# Patient Record
Sex: Male | Born: 1955
Health system: Southern US, Community
[De-identification: ages and names within clinical notes are randomized; demographics above are authoritative.]

## PROBLEM LIST (undated history)

## (undated) DIAGNOSIS — Z8719 Personal history of other diseases of the digestive system: Secondary | ICD-10-CM

## (undated) DIAGNOSIS — K5790 Diverticulosis of intestine, part unspecified, without perforation or abscess without bleeding: Secondary | ICD-10-CM

## (undated) DIAGNOSIS — C61 Malignant neoplasm of prostate: Secondary | ICD-10-CM

## (undated) DIAGNOSIS — Z87442 Personal history of urinary calculi: Secondary | ICD-10-CM

## (undated) DIAGNOSIS — K2270 Barrett's esophagus without dysplasia: Secondary | ICD-10-CM

## (undated) DIAGNOSIS — T7840XA Allergy, unspecified, initial encounter: Secondary | ICD-10-CM

## (undated) DIAGNOSIS — K219 Gastro-esophageal reflux disease without esophagitis: Secondary | ICD-10-CM

## (undated) DIAGNOSIS — K5792 Diverticulitis of intestine, part unspecified, without perforation or abscess without bleeding: Secondary | ICD-10-CM

## (undated) DIAGNOSIS — T4145XA Adverse effect of unspecified anesthetic, initial encounter: Secondary | ICD-10-CM

## (undated) DIAGNOSIS — M1990 Unspecified osteoarthritis, unspecified site: Secondary | ICD-10-CM

## (undated) DIAGNOSIS — E785 Hyperlipidemia, unspecified: Secondary | ICD-10-CM

## (undated) HISTORY — DX: Unspecified osteoarthritis, unspecified site: M19.90

## (undated) HISTORY — DX: Hyperlipidemia, unspecified: E78.5

## (undated) HISTORY — DX: Allergy, unspecified, initial encounter: T78.40XA

## (undated) HISTORY — DX: Barrett's esophagus without dysplasia: K22.70

## (undated) HISTORY — DX: Diverticulosis of intestine, part unspecified, without perforation or abscess without bleeding: K57.90

## (undated) HISTORY — PX: UPPER GASTROINTESTINAL ENDOSCOPY: SHX188

## (undated) HISTORY — DX: Malignant neoplasm of prostate: C61

## (undated) HISTORY — DX: Gastro-esophageal reflux disease without esophagitis: K21.9

## (undated) HISTORY — PX: MOHS SURGERY: SHX181

## (undated) HISTORY — DX: Diverticulitis of intestine, part unspecified, without perforation or abscess without bleeding: K57.92

## (undated) HISTORY — DX: Personal history of other diseases of the digestive system: Z87.19

## (undated) HISTORY — PX: COLONOSCOPY: SHX174

---

## 2004-05-06 ENCOUNTER — Ambulatory Visit: Payer: Self-pay | Admitting: Family Medicine

## 2004-05-28 ENCOUNTER — Ambulatory Visit: Payer: Self-pay | Admitting: Family Medicine

## 2004-11-12 ENCOUNTER — Ambulatory Visit: Payer: Self-pay | Admitting: Internal Medicine

## 2004-11-17 ENCOUNTER — Ambulatory Visit: Payer: Self-pay | Admitting: Family Medicine

## 2004-11-18 ENCOUNTER — Encounter: Admission: RE | Admit: 2004-11-18 | Discharge: 2004-11-18 | Payer: Self-pay | Admitting: Family Medicine

## 2004-12-02 ENCOUNTER — Ambulatory Visit: Payer: Self-pay | Admitting: Family Medicine

## 2005-03-04 ENCOUNTER — Ambulatory Visit: Payer: Self-pay | Admitting: Family Medicine

## 2005-04-08 ENCOUNTER — Ambulatory Visit: Payer: Self-pay | Admitting: Family Medicine

## 2005-05-02 HISTORY — PX: SHOULDER ARTHROSCOPY: SHX128

## 2005-06-13 ENCOUNTER — Ambulatory Visit: Payer: Self-pay | Admitting: Family Medicine

## 2006-06-03 ENCOUNTER — Emergency Department (HOSPITAL_COMMUNITY): Admission: EM | Admit: 2006-06-03 | Discharge: 2006-06-03 | Payer: Self-pay | Admitting: Emergency Medicine

## 2006-06-07 ENCOUNTER — Ambulatory Visit: Payer: Self-pay | Admitting: Family Medicine

## 2006-06-07 LAB — CONVERTED CEMR LAB
ALT: 39 U/L
AST: 32 U/L
Albumin: 4.3 g/dL
Alkaline Phosphatase: 76 U/L
BUN: 16 mg/dL
Basophils Absolute: 0 K/uL
Basophils Relative: 0.7 %
Bilirubin, Direct: 0.1 mg/dL
CO2: 31 meq/L
Calcium: 9.4 mg/dL
Chloride: 108 meq/L
Cholesterol: 213 mg/dL
Creatinine, Ser: 1.1 mg/dL
Direct LDL: 148 mg/dL
Eosinophils Absolute: 0.1 K/uL
Eosinophils Relative: 2.3 %
GFR calc Af Amer: 91 mL/min
GFR calc non Af Amer: 75 mL/min
Glucose, Bld: 89 mg/dL
HCT: 42.7 %
HDL: 48.2 mg/dL
Hemoglobin: 14.8 g/dL
Lymphocytes Relative: 30.6 %
MCHC: 34.8 g/dL
MCV: 89.4 fL
Monocytes Absolute: 0.4 K/uL
Monocytes Relative: 8 %
Neutro Abs: 3 K/uL
Neutrophils Relative %: 58.4 %
PSA: 1.39 ng/mL
Platelets: 322 K/uL
Potassium: 3.9 meq/L
RBC: 4.77 M/uL
RDW: 11.8 %
Sodium: 144 meq/L
TSH: 1.31 u[IU]/mL
Total Bilirubin: 0.7 mg/dL
Total CHOL/HDL Ratio: 4.4
Total Protein: 6.6 g/dL
Triglycerides: 125 mg/dL
VLDL: 25 mg/dL
WBC: 5.1 10*3/microliter

## 2006-06-19 ENCOUNTER — Ambulatory Visit: Payer: Self-pay | Admitting: Family Medicine

## 2006-07-07 ENCOUNTER — Ambulatory Visit (HOSPITAL_COMMUNITY): Admission: RE | Admit: 2006-07-07 | Discharge: 2006-07-07 | Payer: Self-pay | Admitting: Family Medicine

## 2006-10-17 ENCOUNTER — Encounter: Payer: Self-pay | Admitting: Internal Medicine

## 2006-10-17 ENCOUNTER — Ambulatory Visit (HOSPITAL_COMMUNITY): Admission: RE | Admit: 2006-10-17 | Discharge: 2006-10-17 | Payer: Self-pay | Admitting: Internal Medicine

## 2006-10-23 ENCOUNTER — Ambulatory Visit: Payer: Self-pay | Admitting: Internal Medicine

## 2006-11-10 DIAGNOSIS — M5137 Other intervertebral disc degeneration, lumbosacral region: Secondary | ICD-10-CM | POA: Insufficient documentation

## 2006-11-10 DIAGNOSIS — M5030 Other cervical disc degeneration, unspecified cervical region: Secondary | ICD-10-CM | POA: Insufficient documentation

## 2006-11-10 DIAGNOSIS — J309 Allergic rhinitis, unspecified: Secondary | ICD-10-CM

## 2006-11-24 ENCOUNTER — Ambulatory Visit: Payer: Self-pay | Admitting: Internal Medicine

## 2006-11-28 ENCOUNTER — Ambulatory Visit (HOSPITAL_COMMUNITY): Admission: RE | Admit: 2006-11-28 | Discharge: 2006-11-28 | Payer: Self-pay | Admitting: Internal Medicine

## 2006-12-12 ENCOUNTER — Telehealth (INDEPENDENT_AMBULATORY_CARE_PROVIDER_SITE_OTHER): Payer: Self-pay | Admitting: *Deleted

## 2006-12-19 ENCOUNTER — Ambulatory Visit (HOSPITAL_COMMUNITY): Admission: RE | Admit: 2006-12-19 | Discharge: 2006-12-19 | Payer: Self-pay | Admitting: Family Medicine

## 2006-12-20 ENCOUNTER — Telehealth (INDEPENDENT_AMBULATORY_CARE_PROVIDER_SITE_OTHER): Payer: Self-pay | Admitting: *Deleted

## 2007-02-07 ENCOUNTER — Telehealth: Payer: Self-pay | Admitting: Family Medicine

## 2007-03-02 ENCOUNTER — Ambulatory Visit: Payer: Self-pay | Admitting: Family Medicine

## 2007-03-02 DIAGNOSIS — E782 Mixed hyperlipidemia: Secondary | ICD-10-CM | POA: Insufficient documentation

## 2007-03-02 LAB — CONVERTED CEMR LAB
LDL Cholesterol: 126 mg/dL — ABNORMAL HIGH (ref 0–99)
Total CHOL/HDL Ratio: 3.5
Triglycerides: 102 mg/dL (ref <150)

## 2007-06-07 IMAGING — PT NM PET TUM IMG SKULL BASE T - THIGH
6 series · 25 of 25 positions shown · non-contrast
Comparison: Abdominal CT of 06/03/06.

CLINICAL DATA: Right cardiophrenic angle soft tissue masses on prior abdominal CT.   
FDG PET-CT TUMOR IMAGING (SKULL BASE TO THIGHS):
Fasting Blood Glucose:  116.
TECHNIQUE: 15.3 mCi F-18 FDG was injected intravenously via the left AC injection.  Full-ring PET imaging was performed from the skull base through the mid-thighs 60 minutes after injection.  CT data was obtained and used for attenuation correction and anatomic localization only.  (This was not acquired as a diagnostic CT examination).

[Series 1: pet ac · axial · 3.3mm · 4.69mm/px · z∈[-870,+0]mm · 5 of 267 slices shown]
[im 1/267]
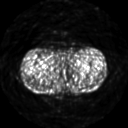
[im 67/267]
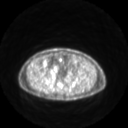
[im 134/267]
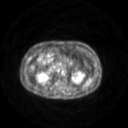
[im 200/267]
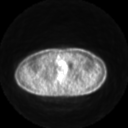
[im 267/267]
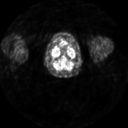

[Series 2: ct images · axial · 3.8mm · 0.98mm/px · z∈[-870,+0]mm · 5 of 264 slices shown]
[im 1/264]
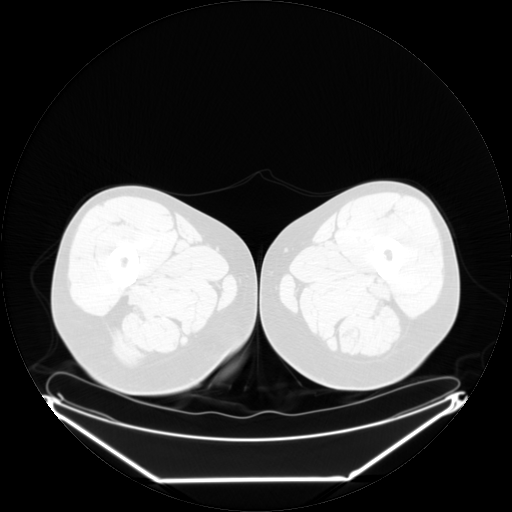
[im 66/264]
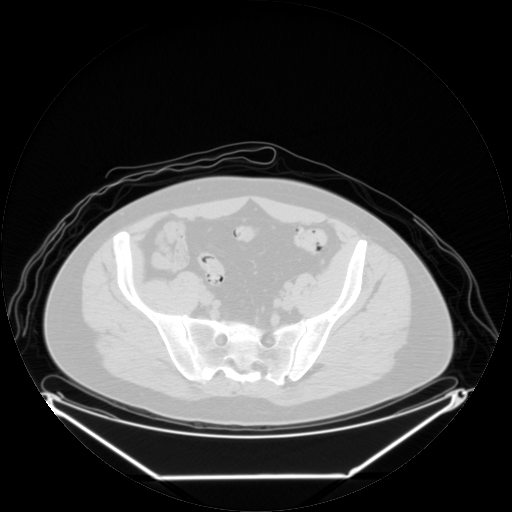
[im 132/264]
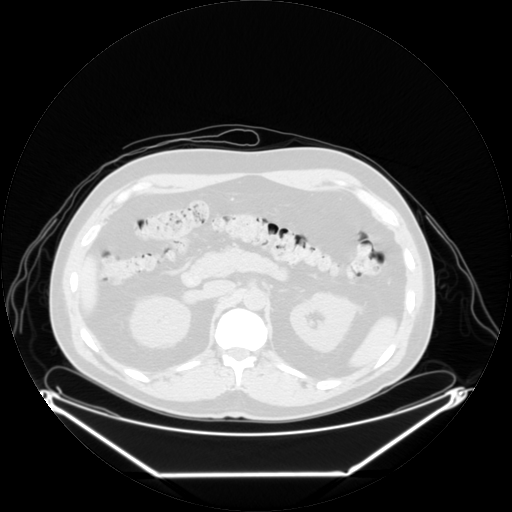
[im 198/264]
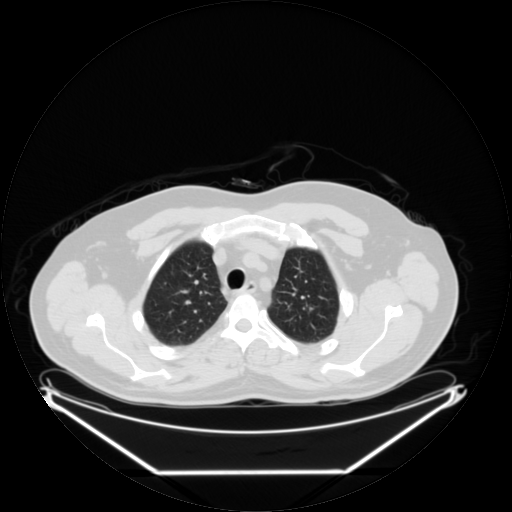
[im 264/264  brain]
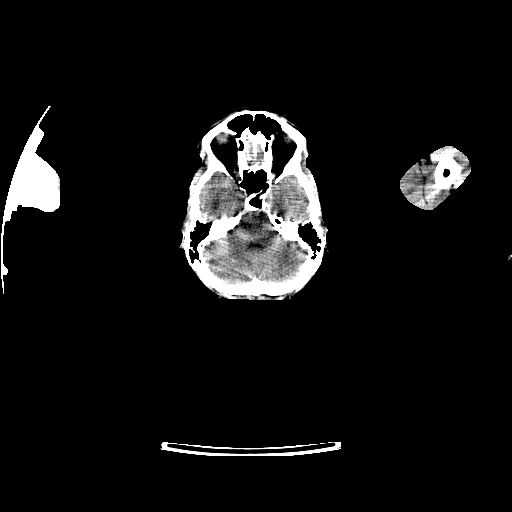

[Series 2: pet nac · axial · 3.3mm · 4.69mm/px · z∈[-870,+0]mm · 6 of 267 slices shown]
[im 1/267]
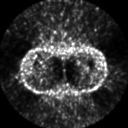
[im 54/267]
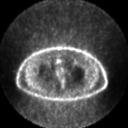
[im 107/267]
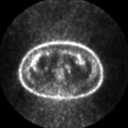
[im 160/267]
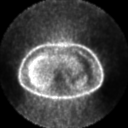
[im 213/267]
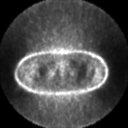
[im 267/267]
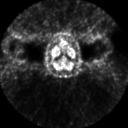

[Series 123: mip · coronal · 3.3mm · 4.69mm/px · 1 of 30 slices shown]
[im 1/30]
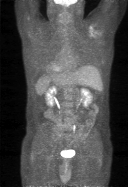

[Series 151: reformatted · axial · 3.3mm · 3.91mm/px · z∈[-870,+0]mm · 6 of 265 slices shown (1 of 2)]
[im 1/265]
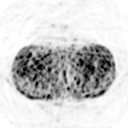
[im 53/265]
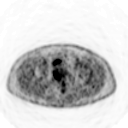
[im 106/265]
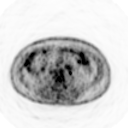
[im 159/265]
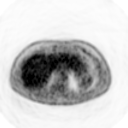
[im 212/265]
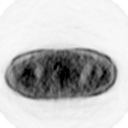
[im 265/265]
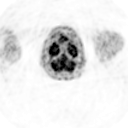

[Series 153: reformatted · coronal · 4.7mm · 6.98mm/px · 2 of 74 slices shown (2 of 2)]
[im 1/74]
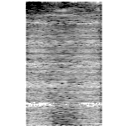
[im 74/74]
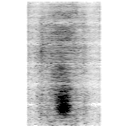

[25 of 25 positions shown; findings below may reference images not displayed]

FINDINGS: PET images demonstrate an area of mild hypermetabolism at the left retromolar trigone on image 21.  There is no CT correlate in this area, but this area is frought with beam hardening artifact from dental amalgam. 
Activity involves the right shoulder and acromioclavicular joint likely degenerative and/or due to rotator cuff disease.  
The low-density nodules centered in the right cardiophrenic angle are not hypermetabolic.  These are similar measuring approximately 4 in number with the largest being 2.5 x 1.8 cm.
No abnormal activity within the abdomen or pelvis.  
CT images performed for attenuation correction demonstrates no mediastinal or hilar adenopathy.
No lung nodules  
Abdominal/pelvis findings will be deferred to recent CT.  The left-sided hydronephrosis has resolved. Again demonstrated is partial small bowel malrotation with the SMA positioned right of the SMV.  The left-sided UVJ calculus is passed.
IMPRESSION: 1.  The right cardiophrenic angle low-density lesions are not hypermetabolic.  Favor these being multifocal pericardial cysts.  Isolated enlarged lymph nodes are felt less likely. Consider follow-up chest CT in 3-6 months to confirm stability.
2.  No other abnormality within the chest to suggest alternate etiology such as lymphoproliferative process or chronic infection.
3.  Area of activity at the left retromolar trigone is likely physiologic but could be correlated with physical exam findings.

## 2007-08-15 DIAGNOSIS — K209 Esophagitis, unspecified: Secondary | ICD-10-CM

## 2007-08-24 ENCOUNTER — Ambulatory Visit: Payer: Self-pay | Admitting: Family Medicine

## 2007-08-27 ENCOUNTER — Telehealth: Payer: Self-pay | Admitting: Family Medicine

## 2007-08-30 ENCOUNTER — Telehealth: Payer: Self-pay | Admitting: Family Medicine

## 2007-08-31 ENCOUNTER — Ambulatory Visit: Payer: Self-pay | Admitting: Family Medicine

## 2008-05-01 ENCOUNTER — Ambulatory Visit: Payer: Self-pay | Admitting: Family Medicine

## 2008-05-01 DIAGNOSIS — J45909 Unspecified asthma, uncomplicated: Secondary | ICD-10-CM | POA: Insufficient documentation

## 2008-05-02 HISTORY — PX: PROSTATE SURGERY: SHX751

## 2008-05-23 ENCOUNTER — Ambulatory Visit: Payer: Self-pay | Admitting: Family Medicine

## 2008-07-11 ENCOUNTER — Telehealth: Payer: Self-pay | Admitting: Family Medicine

## 2008-08-06 ENCOUNTER — Ambulatory Visit: Payer: Self-pay | Admitting: Internal Medicine

## 2008-08-19 ENCOUNTER — Ambulatory Visit: Payer: Self-pay | Admitting: Internal Medicine

## 2008-08-20 ENCOUNTER — Encounter (INDEPENDENT_AMBULATORY_CARE_PROVIDER_SITE_OTHER): Payer: Self-pay | Admitting: *Deleted

## 2008-11-07 ENCOUNTER — Ambulatory Visit: Payer: Self-pay | Admitting: Internal Medicine

## 2008-11-21 ENCOUNTER — Encounter: Payer: Self-pay | Admitting: Internal Medicine

## 2008-11-21 ENCOUNTER — Ambulatory Visit: Payer: Self-pay | Admitting: Internal Medicine

## 2008-11-24 ENCOUNTER — Encounter: Payer: Self-pay | Admitting: Internal Medicine

## 2009-01-08 ENCOUNTER — Ambulatory Visit: Payer: Self-pay | Admitting: Family Medicine

## 2009-05-02 DIAGNOSIS — T8859XA Other complications of anesthesia, initial encounter: Secondary | ICD-10-CM

## 2009-05-02 HISTORY — DX: Other complications of anesthesia, initial encounter: T88.59XA

## 2009-05-19 ENCOUNTER — Telehealth: Payer: Self-pay | Admitting: Family Medicine

## 2009-05-26 ENCOUNTER — Ambulatory Visit: Payer: Self-pay | Admitting: Family Medicine

## 2009-05-26 LAB — CONVERTED CEMR LAB
Albumin: 4.4 g/dL (ref 3.5–5.2)
Basophils Absolute: 0 10*3/uL (ref 0.0–0.1)
Bilirubin Urine: NEGATIVE
Bilirubin, Direct: 0.1 mg/dL (ref 0.0–0.3)
Blood in Urine, dipstick: NEGATIVE
Chloride: 108 meq/L (ref 96–112)
Cholesterol: 170 mg/dL (ref 0–200)
Eosinophils Relative: 2.2 % (ref 0.0–5.0)
GFR calc non Af Amer: 74.33 mL/min (ref >60)
Glucose, Bld: 106 mg/dL — ABNORMAL HIGH (ref 70–99)
HCT: 42.9 % (ref 39.0–52.0)
Hemoglobin: 14.3 g/dL (ref 13.0–17.0)
Lymphs Abs: 1.2 10*3/uL (ref 0.7–4.0)
MCHC: 33.3 g/dL (ref 30.0–36.0)
Monocytes Absolute: 0.4 10*3/uL (ref 0.1–1.0)
Monocytes Relative: 8.7 % (ref 3.0–12.0)
Neutro Abs: 3.2 10*3/uL (ref 1.4–7.7)
Nitrite: NEGATIVE
PSA: 6.13 ng/mL — ABNORMAL HIGH (ref 0.10–4.00)
Platelets: 282 10*3/uL (ref 150.0–400.0)
Protein, U semiquant: NEGATIVE
RBC: 4.66 M/uL (ref 4.22–5.81)
RDW: 11.5 % (ref 11.5–14.6)
Sodium: 143 meq/L (ref 135–145)
Specific Gravity, Urine: 1.025
TSH: 1.32 microintl units/mL (ref 0.35–5.50)
Total Bilirubin: 0.6 mg/dL (ref 0.3–1.2)
Total Protein: 6.9 g/dL (ref 6.0–8.3)
Urobilinogen, UA: 0.2

## 2009-06-04 ENCOUNTER — Ambulatory Visit: Payer: Self-pay | Admitting: Family Medicine

## 2009-08-03 ENCOUNTER — Telehealth: Payer: Self-pay | Admitting: Family Medicine

## 2009-10-06 ENCOUNTER — Telehealth: Payer: Self-pay | Admitting: Internal Medicine

## 2010-05-23 ENCOUNTER — Encounter: Payer: Self-pay | Admitting: Internal Medicine

## 2010-06-01 NOTE — Progress Notes (Signed)
Summary: requesting labs  Phone Note Call from Patient Call back at Work Phone [phone]   Caller: Patient-live call Summary of Call: On Simvastatin. He would like to know what labs should he have? Initial call taken by: Warnell Forester,  May 19, 2009 10:12 AM  Follow-up for Phone Call        patient needs to be set up for a complete CPX with routine male screening labs Follow-up by: Roderick Pee MD,  May 19, 2009 10:39 AM  Additional Follow-up for Phone Call Additional follow up Details #1::        lmoam- to call and set up cpx. Additional Follow-up by: Warnell Forester,  May 19, 2009 11:03 AM

## 2010-06-01 NOTE — Assessment & Plan Note (Signed)
Summary: cpx//ccm   Vital Signs:  Patient profile:   55 year old male Height:      69.5 inches Weight:      190 pounds Temp:     98.7 degrees F oral BP sitting:   114 / 78  (left arm) Cuff size:   regular  Vitals Entered By: Kern Reap CMA Duncan Dull) (June 04, 2009 8:43 AM)  Reason for Visit cpx  History of Present Illness: Daniel Booth is a 55 year old, divorced male, nonsmoker, who comes in today for evaluation.  Multiple issues.  we referred him to Dr. Dickie La because of symptoms of food getting stuck in his esophagus.  He had dilatation of his esophagus and was noted to have Barrett's esophagus.  He is on chronic reflux treatment and every two-year evaluation.  He's asymptomatic.  Colonoscopy also done, normal    He has hyperlipidemia, for which he takes simvastatin 20 mg nightly lipids are normal.  His underlying allergic rhinitis, for which she takes Zyrtec 10 mg nightly and steroid nasal spray.  For the past 2 1/2 weeks, since he's been very, stuffy.  She's been using Afrin nasal spray.  We talked about getting off of that.  Will put him on a short course of prednisone.  He does not get routine eye care.  Recommend Dr. Vonna Kotyk  He gets routine dental care.  Colonoscopy 2009 as noted above.  Normal.  Tetanus 2007.    he also has degenerative disease of the cervical and lumbar spine.  Currently asymptomatic  Allergies (verified): No Known Drug Allergies  Past History:  Past medical, surgical, family and social histories (including risk factors) reviewed, and no changes noted (except as noted below).  Past Medical History: Reviewed history from 08/15/2007 and no changes required. Allergic rhinitis Hyperlipidemia esophageal stricture kidney stones  right shoulder surgerycervical degenerative disk space lumbar degenerative disease Current Problems:  BARRETT'S ESOPHAGUS, HX OF (ICD-V12.79) DISTURBANCE OF SKIN SENSATION (ICD-782.0) INGUINAL PAIN, LEFT  (ICD-789.09) HYPERLIPIDEMIA (ICD-272.4) MIXED HYPERLIPIDEMIA (ICD-272.2) FAMILY HISTORY OF MELANOMA (ICD-V16.8) FAMILY HISTORY OF CAD MALE 1ST DEGREE RELATIVE <60 (ICD-V16.49) ALLERGIC RHINITIS (ICD-477.9) DEGENERATIVE DISC DISEASE, LUMBAR SPINE (ICD-722.52) DEGENERATIVE DISC DISEASE, CERVICAL SPINE (ICD-722.4)  Past Surgical History: Reviewed history from 11/10/2006 and no changes required. Rotator cuff repair-Rt shoulder Kidney Stones Esophageal stricture  Family History: Reviewed history from 11/10/2006 and no changes required. Family History of CAD Male 1st degree relative <60 Family History of Endometrial cancer Family History Hypertension Family History Liver disease Family History of Melanoma  Social History: Reviewed history from 01/08/2009 and no changes required. Married Alcohol use-yes Non smoker Regular exercise-yes sep. x 5 mo.  Review of Systems      See HPI  Physical Exam  General:  Well-developed,well-nourished,in no acute distress; alert,appropriate and cooperative throughout examination Head:  Normocephalic and atraumatic without obvious abnormalities. No apparent alopecia or balding. Eyes:  No corneal or conjunctival inflammation noted. EOMI. Perrla. Funduscopic exam benign, without hemorrhages, exudates or papilledema. Vision grossly normal. Ears:  External ear exam shows no significant lesions or deformities.  Otoscopic examination reveals clear canals, tympanic membranes are intact bilaterally without bulging, retraction, inflammation or discharge. Hearing is grossly normal bilaterally. Nose:  External nasal examination shows no deformity or inflammation. Nasal mucosa are pink and moist without lesions or exudates. Mouth:  Oral mucosa and oropharynx without lesions or exudates.  Teeth in good repair. Neck:  No deformities, masses, or tenderness noted. Chest Wall:  No deformities, masses, tenderness or gynecomastia noted. Breasts:  No masses or  gynecomastia noted Lungs:  Normal respiratory effort, chest expands symmetrically. Lungs are clear to auscultation, no crackles or wheezes. Heart:  Normal rate and regular rhythm. S1 and S2 normal without gallop, murmur, click, rub or other extra sounds. Abdomen:  Bowel sounds positive,abdomen soft and non-tender without masses, organomegaly or hernias noted. Rectal:  No external abnormalities noted. Normal sphincter tone. No rectal masses or tenderness. Genitalia:  Testes bilaterally descended without nodularity, tenderness or masses. No scrotal masses or lesions. No penis lesions or urethral discharge. Prostate:  Prostate gland firm and smooth, no enlargement, nodularity, tenderness, mass, asymmetry or induration. Msk:  No deformity or scoliosis noted of thoracic or lumbar spine.   Pulses:  R and L carotid,radial,femoral,dorsalis pedis and posterior tibial pulses are full and equal bilaterally Extremities:  No clubbing, cyanosis, edema, or deformity noted with normal full range of motion of all joints.   Neurologic:  No cranial nerve deficits noted. Station and gait are normal. Plantar reflexes are down-going bilaterally. DTRs are symmetrical throughout. Sensory, motor and coordinative functions appear intact. Skin:  Intact without suspicious lesions or rashes Cervical Nodes:  No lymphadenopathy noted Axillary Nodes:  No palpable lymphadenopathy Inguinal Nodes:  No significant adenopathy Psych:  Cognition and judgment appear intact. Alert and cooperative with normal attention span and concentration. No apparent delusions, illusions, hallucinations   Impression & Recommendations:  Problem # 1:  HYPERLIPIDEMIA (ICD-272.4) Assessment Improved  His updated medication list for this problem includes:    Simvastatin 20 Mg Tabs (Simvastatin) .Marland Kitchen... 1 tab by mouth at bedtime  Orders: Prescription Created Electronically [phone]) EKG w/ Interpretation (93000)  Problem # 2:  ALLERGIC RHINITIS  (ICD-477.9) Assessment: Deteriorated  The following medications were removed from the medication list:    Zyrtec Allergy 10 Mg Tabs (Cetirizine hcl) ..... Once daily    Nasacort Aq 55 Mcg/act Aers (Triamcinolone acetonide(nasal)) .Marland Kitchen... 2 sprays each nostril  once per day    Decongestant 30 Mg Tabs (Pseudoephedrine hcl) ..... Once daily as needed His updated medication list for this problem includes:    Flonase 50 Mcg/act Susp (Fluticasone propionate) .Marland Kitchen... Take as directed  Orders: Prescription Created Electronically [phone])  Problem # 3:  DEGENERATIVE DISC DISEASE, LUMBAR SPINE (ICD-722.52) Assessment: Improved  Orders: Prescription Created Electronically [phone])  Problem # 4:  PSA, INCREASED (ICD-790.93) Assessment: New  Complete Medication List: 1)  [facility] Omeprazole 20 Mg Tbec (Omeprazole) .... Once daily 2)  Simvastatin 20 Mg Tabs (Simvastatin) .Marland Kitchen.. 1 tab by mouth at bedtime 3)  Adult Aspirin Ec Low Strength 81 Mg Tbec (Aspirin) .... Qd 4)  [facility] Ibuprofen 200 Mg Tabs (Ibuprofen) .... Two once daily 5)  Multivitamins Tabs (Multiple vitamin) .... Once daily 6)  Flonase 50 Mcg/act Susp (Fluticasone propionate) .... Take as directed 7)  Viagra 50 Mg Tabs (Sildenafil citrate) .... Uad 8)  Prednisone 20 Mg Tabs (Prednisone) .... Uad  Patient Instructions: 1)  switch to plain Claritin in the morning for your allergic rhinitis.  Also let's take only her medication in the morning. 2)  Please schedule a follow-up appointment in 1 year. 3)  begin prednisone, take one tab x 5 days, a half x 5 days, then a half Monday, Wednesday, Friday, for two weeks. 4)  while you are  taking the prednisone, stop the Claritin, and the steroid nasal spray.  Once you finish the prednisone, then restart the Claritin, and the steroid nasal spray 5)  call your urologist whom you saw for the kidney  stone and get set up for a consultation and evaluation because of the elevation above for of your  PSA Prescriptions: VIAGRA 50 MG TABS (SILDENAFIL CITRATE) UAD  #6 x 11   Entered and Authorized by:   Roderick Pee MD   Signed by:   Roderick Pee MD on 06/04/2009   Method used:   Electronically to        [facility]  [phone]* (retail)       [address] [place], [place]  [address]       Ph: [phone] or [phone]       Fax: [phone]   RxID:   [id number] FLONASE 50 MCG/ACT  SUSP (FLUTICASONE PROPIONATE) take as directed  #3 units x 3   Entered and Authorized by:   Roderick Pee MD   Signed by:   Roderick Pee MD on 06/04/2009   Method used:   Electronically to        [facility]  [phone]* (retail)       [address] [place], [place]  [address]       Ph: [phone] or [phone]       Fax: [phone]   RxID:   [id number] SIMVASTATIN 20 MG  TABS (SIMVASTATIN) 1 TAB by mouth at bedtime  #100 x 3   Entered and Authorized by:   Roderick Pee MD   Signed by:   Roderick Pee MD on 06/04/2009   Method used:   Electronically to        [facility]  [phone]* (retail)       [address] [place], [place]  [address]       Ph: [phone] or [phone]       Fax: [phone]   RxID:   [id number] [facility] OMEPRAZOLE 20 MG  TBEC (OMEPRAZOLE) once daily  #100 x 3   Entered and Authorized by:   Roderick Pee MD   Signed by:   Roderick Pee MD on 06/04/2009   Method used:   Electronically to        [facility]  [phone]* (retail)       [address] [place], [place]  [address]       Ph: [phone] or [phone]       Fax: [phone]   RxID:   [id number] PREDNISONE 20 MG TABS (PREDNISONE) UAD  #30 x 0   Entered and Authorized by:   Roderick Pee MD   Signed by:   Roderick Pee MD on 06/04/2009   Method used:   Electronically to        [facility]  [phone]* (retail)       [address] [place], [place]  [address]       Ph: [phone] or [phone]       Fax: [phone]    RxID:   [id number]

## 2010-06-01 NOTE — Progress Notes (Signed)
Summary: Triage  Phone Note Call from Patient Call back at Home Phone [phone]   Caller: Patient Call For: Dr. Juanda Chance Reason for Call: Talk to Nurse Summary of Call: Pt. is going to have surgery in [place], [place]. Last time he had a colon/endo he had a reaction to the meds/anesthesia and would like to know what was given. Initial call taken by: Karna Christmas,  October 06, 2009 3:54 PM  Follow-up for Phone Call        Last Endo. was 11-21-08 and last Colon was 08-19-08, had Versed and Fentanyl for both procedures. Pt. instructed to call back as needed.  Follow-up by: Laureen Ochs LPN,  October 06, 1476 4:21 PM

## 2010-06-01 NOTE — Progress Notes (Signed)
Summary: reknowned  Phone Note Call from Patient Call back at Home Phone [phone]   Reason for Call: Talk to Doctor Summary of Call: Re prostate cancer, recent biopsy indicated early stage.  Going to [facility] cancer clinic,  [place].   Got PSA numbers from office today in case they ask.  1.39 2-08 6.13 1-11.   Wants to keep you in the know.     Initial call taken by: Rudy Jew, RN,  August 03, 2009 2:37 PM  Follow-up for Phone Call        ok............good luck..........?going to [place] ???????? Follow-up by: Roderick Pee MD,  August 03, 2009 2:50 PM  Additional Follow-up for Phone Call Additional follow up Details #1::        He said they're reknowned for cancer treatment. Additional Follow-up by: Rudy Jew, RN,  August 03, 2009 3:08 PM

## 2010-06-01 NOTE — Progress Notes (Signed)
Summary: refill  Phone Note Call from Patient   Caller: Patient-live call Reason for Call: Refill Medication Summary of Call: [facility] [address]. pt has scheduled a cpx for 06-04-2008 at 8:30am.  Initial call taken by: Warnell Forester,  May 19, 2009 4:49 PM    Prescriptions: SIMVASTATIN 20 MG  TABS (SIMVASTATIN) 1 TAB by mouth at bedtime  #30 Tablet x 0   Entered by:   Kern Reap CMA (AAMA)   Authorized by:   Roderick Pee MD   Signed by:   Kern Reap CMA (AAMA) on 05/19/2009   Method used:   Electronically to        [facility]  [phone]* (retail)       [address] [place], [place]  [address]       Ph: [phone] or [phone]       Fax: [phone]   RxID:   [id number]

## 2010-06-19 ENCOUNTER — Other Ambulatory Visit: Payer: Self-pay | Admitting: Family Medicine

## 2010-06-29 ENCOUNTER — Ambulatory Visit (INDEPENDENT_AMBULATORY_CARE_PROVIDER_SITE_OTHER): Payer: BC Managed Care – PPO | Admitting: Family Medicine

## 2010-06-29 ENCOUNTER — Encounter: Payer: Self-pay | Admitting: Family Medicine

## 2010-06-29 DIAGNOSIS — C61 Malignant neoplasm of prostate: Secondary | ICD-10-CM

## 2010-06-29 DIAGNOSIS — R197 Diarrhea, unspecified: Secondary | ICD-10-CM

## 2010-06-29 NOTE — Progress Notes (Signed)
  Subjective:    Patient ID: Daniel Booth, male    DOB: 06-Jan-1956, 55 y.o.   MRN: [id number]  HPI Daniel Booth is a 55 year old male comes into with a 12 hour history of nausea, fever, chills, and diarrhea.  No vomiting   Review of Systems    Negative except he had a prostatectomy in [place], [place] in June of 2011 for prostate cancer.  Surgery was successful.  No complications Objective:   Physical Exam    Well-developed well-nourished, male in no acute distress.  The abdomen is soft.  The bowel sounds are normal.  No rebound diffuse tenderness    Assessment & Plan:  Diarrhea viral,,,,,,,,, planned liquids, Tylenol.  Return p.r.n.

## 2010-06-29 NOTE — Patient Instructions (Signed)
Tylenol for fever and chills.  Clear liquid diet.  Return p.r.n.

## 2010-07-29 ENCOUNTER — Other Ambulatory Visit: Payer: Self-pay | Admitting: Family Medicine

## 2010-07-29 ENCOUNTER — Other Ambulatory Visit: Payer: Self-pay | Admitting: Internal Medicine

## 2010-07-30 NOTE — Telephone Encounter (Signed)
Time for an office visit 

## 2010-09-14 NOTE — Assessment & Plan Note (Signed)
[facility]                         GASTROENTEROLOGY OFFICE NOTE   NAME:Keehan, CHARON AKAMINE                      MRN:          [id number]  DATE:11/24/2006                            DOB:          1955-05-31    Mr. Lindblad is a 55 year old gentleman, who was just found to have a  Barrett's esophagus.  We saw him initially in 1997 for solid food  dysphagia.  At that time, he had upper endoscopy with esophageal  dilation.  No stricture was seen, only esophageal spasm, but his  dysphagia was completely relieved.  He denies any symptoms of excessive  gastroesophageal reflux.  About six times a year he has heartburn.  Recently, he was having increasing solid food dysphagia and underwent  repeat upper endoscopy on October 17, 2006, but findings again of only mild  esophageal stricture.  It was unclear whether it was mostly spasm, or a  mild stricture, as well.  The biopsies showed intestinal metaplasia,  consistent with short segment Barrett's esophagus.  He also was dilated  up to 18 mm with Savary dilators with complete relief of his dysphagia.  He is currently asymptomatic.  Because of the presence of Barrett's  esophagus, we have started him on Prilosec 20 mg a day.  The aggravating  factor in his reflux disease has been aspirin 325 mg a day, ibuprofen  400 mg q.h.s., late-night eating.   Today, we have discussed Barrett's esophagus.  He received a booklet on  Barrett's esophagus and he was interested in whether he has dysplasia  and had many different questions pertaining to his Barrett's esophagus.  I also am not clear as to whether most of his symptoms are due to a  stricture or whether he has some motility disorder, causing lower  esophageal sphincter dysfunction.  He is interested in finding out.   PLAN:  1. Barium swallow with cine-esophagograms.  2. Continue Prilosec 20 mg daily.  3. Reduce aspirin to 81 mg coated aspirin daily.  4. Decrease ibuprofen  to minimal dose and take it in the mornings      rather than at night.  5. Antireflux measures with modification of his eating habits.  6. Repeat upper endoscopy in two years.  At that time, we will re-      biopsy the esophagus.     Hedwig Morton. Juanda Chance, MD  Electronically Signed    DMB/MedQ  DD: 11/24/2006  DT: 11/24/2006  Job #: [id number]   cc:   Tinnie Gens A. Tawanna Cooler, MD

## 2010-09-17 NOTE — Assessment & Plan Note (Signed)
[facility]                                 ON-CALL NOTE   NAME:Daniel Booth, Daniel Booth                  MRN:          [id number]  DATE:06/03/2006                            DOB:          Feb 26, 1956    TIME OF INTERACTION:  8:17 a.m.   PHONE NUMBER:  [phone].   The initial caller was the wife Daniel Booth. I actually spoke to the patient.   OBJECTIVE:  The patient has had pain in the lower left side since  Wednesday. On Wednesday night, he had some difficulty urinating with  burning which has not really progressed today. He has pain in the left  lower abdomen with significant discomfort. He has not seen blood in his  urine. He has never had a kidney stone but he is not sure what is going  on.  Patient  may have urinary infection, may have a urinary stone.   PLAN:  Would have him go to the emergency room to be seen rather than  see him Saturday morning.   PRIMARY CARE PHYSICIAN:  Dr. Tawanna Cooler.   HOME OFFICE:  [place].     Arta Silence, MD  Electronically Signed    RNS/MedQ  DD: 06/03/2006  DT: 06/03/2006  Job #: [id number]

## 2010-09-28 ENCOUNTER — Other Ambulatory Visit: Payer: Self-pay | Admitting: Internal Medicine

## 2010-09-28 ENCOUNTER — Other Ambulatory Visit: Payer: Self-pay | Admitting: Family Medicine

## 2010-09-30 ENCOUNTER — Other Ambulatory Visit: Payer: Self-pay | Admitting: Family Medicine

## 2010-10-11 ENCOUNTER — Other Ambulatory Visit: Payer: Self-pay | Admitting: Family Medicine

## 2010-10-12 ENCOUNTER — Telehealth: Payer: Self-pay

## 2010-10-12 NOTE — Telephone Encounter (Signed)
Pt called and stated that he needed a refill for flonase.  Returned pt called and pt stated that an rx for flonase had been sent in to pharmacy on 09/30/2010.  Per pt at his pharmacy his name was under to files and this issue was resoled.

## 2010-11-06 ENCOUNTER — Other Ambulatory Visit: Payer: Self-pay | Admitting: Family Medicine

## 2010-11-08 ENCOUNTER — Encounter: Payer: Self-pay | Admitting: Internal Medicine

## 2010-11-08 ENCOUNTER — Other Ambulatory Visit: Payer: Self-pay | Admitting: Internal Medicine

## 2010-11-08 NOTE — Telephone Encounter (Signed)
Looks like [facility] Cooler on 11/06/10. Left message for patient advising him of this.

## 2010-11-08 NOTE — Telephone Encounter (Signed)
Called in.

## 2010-12-07 ENCOUNTER — Ambulatory Visit (AMBULATORY_SURGERY_CENTER): Payer: BC Managed Care – PPO | Admitting: *Deleted

## 2010-12-07 ENCOUNTER — Encounter: Payer: Self-pay | Admitting: Internal Medicine

## 2010-12-07 ENCOUNTER — Other Ambulatory Visit: Payer: Self-pay

## 2010-12-07 VITALS — Ht 70.5 in | Wt 195.0 lb

## 2010-12-07 DIAGNOSIS — K2270 Barrett's esophagus without dysplasia: Secondary | ICD-10-CM

## 2010-12-07 MED ORDER — PEG-KCL-NACL-NASULF-NA ASC-C 100 G PO SOLR: ORAL | Status: DC

## 2010-12-21 ENCOUNTER — Other Ambulatory Visit: Payer: BC Managed Care – PPO | Admitting: Internal Medicine

## 2010-12-21 ENCOUNTER — Ambulatory Visit (AMBULATORY_SURGERY_CENTER): Payer: BC Managed Care – PPO | Admitting: Internal Medicine

## 2010-12-21 ENCOUNTER — Encounter: Payer: Self-pay | Admitting: Internal Medicine

## 2010-12-21 VITALS — BP 116/87 | HR 59 | Temp 97.9°F | Resp 21 | Ht 70.0 in | Wt 195.0 lb

## 2010-12-21 DIAGNOSIS — K209 Esophagitis, unspecified without bleeding: Secondary | ICD-10-CM

## 2010-12-21 DIAGNOSIS — K2270 Barrett's esophagus without dysplasia: Secondary | ICD-10-CM

## 2010-12-21 MED ORDER — SODIUM CHLORIDE 0.9 % IV SOLN: 500.0000 mL | INTRAVENOUS | Status: DC

## 2010-12-21 NOTE — Patient Instructions (Signed)
Please refer to your blue and neon green sheets for instructions regarding diet and activity for the rest of today.  You may resume your medications as you would normally take them.  

## 2010-12-22 ENCOUNTER — Telehealth: Payer: Self-pay

## 2010-12-22 NOTE — Telephone Encounter (Signed)
Left message on answering machine. 

## 2010-12-28 ENCOUNTER — Encounter: Payer: Self-pay | Admitting: Internal Medicine

## 2011-02-16 ENCOUNTER — Ambulatory Visit (INDEPENDENT_AMBULATORY_CARE_PROVIDER_SITE_OTHER): Payer: BC Managed Care – PPO | Admitting: Family Medicine

## 2011-02-16 ENCOUNTER — Encounter: Payer: Self-pay | Admitting: Family Medicine

## 2011-02-16 VITALS — BP 130/90 | Temp 99.1°F | Wt 194.0 lb

## 2011-02-16 DIAGNOSIS — J309 Allergic rhinitis, unspecified: Secondary | ICD-10-CM

## 2011-02-16 MED ORDER — PREDNISONE 20 MG PO TABS: ORAL_TABLET | ORAL | Status: DC

## 2011-02-16 NOTE — Progress Notes (Signed)
  Subjective:    Patient ID: Daniel Booth, male    DOB: May 11, 1955, 54 y.o.   MRN: [id number]  HPI Lohmeyer is a 55 year old male, nonsmoker, who comes in today for evaluation of allergic rhinitis.  He always has trouble in the fall and takes a steroid nasal spray and Claritin plain.  Four days ago he began having severe symptoms, head congestion, postnasal drip.  He's been using the nasal irrigation along with aspirin, nasal spray at bedtime.  No wheezing   Review of Systems    General an allergic review of systems otherwise negative Objective:   Physical Exam  Well-developed well-nourished man in no acute distress.  Examination of the HEENT were negative except for 4+ nasal in the edema.      Assessment & Plan:  Allergic rhinitis.  Plan prednisone burst and taper return p.r.n.

## 2011-02-16 NOTE — Patient Instructions (Signed)
Take the prednisone as directed.  Stop the steroid nasal spray, and the Claritin, while you're taking the prednisone.  Once you begin tapering the prednisone to Monday, Wednesday, Friday, then restart the steroid nasal spray, and Claritin.  Return p.r.n.

## 2011-02-22 ENCOUNTER — Telehealth: Payer: Self-pay | Admitting: *Deleted

## 2011-02-22 NOTE — Telephone Encounter (Signed)
Patient was seen 02/16/11 for Allergies and given Rx for Prednisone [& ear wax kit] with instructions to "take [2] daily until symptoms improve"; Pt states he is still taking [2] tabs daily due to being "very congested & cannot hear out of left ear". Pt wants to know if he should continue to take the Prednisone as [2] tabs daily [and states he has not had results w/ear wax kit].?

## 2011-02-23 NOTE — Telephone Encounter (Signed)
Spoke with patient and appointment made

## 2011-02-23 NOTE — Telephone Encounter (Signed)
Begin to taper the prednisone by taking one tablet daily for 3 days, a half a tab for 3 days, then a half a tablet Monday, Wednesday, Friday, for a two-week taper.  Come in tomorrow for evaluation and of the ear wax

## 2011-02-24 ENCOUNTER — Encounter: Payer: Self-pay | Admitting: Family Medicine

## 2011-02-24 ENCOUNTER — Ambulatory Visit (INDEPENDENT_AMBULATORY_CARE_PROVIDER_SITE_OTHER): Payer: BC Managed Care – PPO | Admitting: Family Medicine

## 2011-02-24 DIAGNOSIS — H6690 Otitis media, unspecified, unspecified ear: Secondary | ICD-10-CM

## 2011-02-24 DIAGNOSIS — H6692 Otitis media, unspecified, left ear: Secondary | ICD-10-CM

## 2011-02-24 DIAGNOSIS — J309 Allergic rhinitis, unspecified: Secondary | ICD-10-CM

## 2011-02-24 DIAGNOSIS — J45909 Unspecified asthma, uncomplicated: Secondary | ICD-10-CM

## 2011-02-24 MED ORDER — AMOXICILLIN 500 MG PO CAPS: ORAL_CAPSULE | ORAL | Status: AC

## 2011-02-24 NOTE — Progress Notes (Signed)
  Subjective:    Patient ID: Daniel Booth, male    DOB: 20-Oct-1955, 55 y.o.   MRN: [id number]  HPI Daniel Booth is a 55 year old male, who comes in today for evaluation of pain in his left ear.  We saw him 10 days ago with a flare of his asthma and start him on a short course of prednisone.  He stand, one tablet a day, and his asthma is pretty much resolved.  A couple days ago he began having pain in his left ear.  Review of systems otherwise negative except for hearing loss.  No fever   Review of Systems General pulmonary and ENT review of systems otherwise negative    Objective:   Physical Exam  Well-developed well-nourished man no acute distress.  Examination of the head was normal except his left ear was full of fluid red, bulging tympanic membrane.  Lungs are clear to auscultation.  No wheezing      Assessment & Plan:  Asthma resolving Taper prednisone.  Left otitis media.  Amoxicillin 500 t.i.d. For 10 days.  Return p.r.n.

## 2011-02-24 NOTE — Patient Instructions (Signed)
Prednisone one tab x 3 days, then a half a tab x 3 days, then a half a tab Monday, Wednesday, Friday, for a two week taper.  Begin amoxicillin 3 times a day for 10 days, one shot of aspirin, nasal spray up each nostril at bedtime for 5 nights Returned in two weeks for follow-up

## 2011-03-10 ENCOUNTER — Encounter: Payer: Self-pay | Admitting: Family Medicine

## 2011-03-10 ENCOUNTER — Ambulatory Visit (INDEPENDENT_AMBULATORY_CARE_PROVIDER_SITE_OTHER): Payer: BC Managed Care – PPO | Admitting: Family Medicine

## 2011-03-10 DIAGNOSIS — H6690 Otitis media, unspecified, unspecified ear: Secondary | ICD-10-CM

## 2011-03-10 DIAGNOSIS — H6692 Otitis media, unspecified, left ear: Secondary | ICD-10-CM

## 2011-03-10 NOTE — Patient Instructions (Signed)
Finish her medication, and return p.r.n.

## 2011-03-10 NOTE — Progress Notes (Signed)
  Subjective:    Patient ID: Daniel Booth, male    DOB: 1955/08/19, 55 y.o.   MRN: [id number]  HPI Daniel Booth is a 55 year old male, who comes in today for follow-up of left otitis media.  He developed a viral syndrome and then a secondary or infection in his left ear.  We put him on amoxicillin, 5 mg t.i.d. And is back today for follow-up saying he feels much better.  He continues to have decreased hearing in that left ear.   Review of Systems General an ENT review of systems otherwise negative    Objective:   Physical Exam Well-developed well-nourished man in no acute distress.  Examination here shows bilateral cerumen impactions, which were removed by me was suctioned.  Hearing then returned to normal      Assessment & Plan:  Otitis media and cerumen impactions resolved finish medicine return p.r.n.

## 2011-04-04 ENCOUNTER — Other Ambulatory Visit: Payer: Self-pay | Admitting: Family Medicine

## 2011-04-24 ENCOUNTER — Other Ambulatory Visit: Payer: Self-pay | Admitting: Family Medicine

## 2011-06-03 ENCOUNTER — Telehealth: Payer: Self-pay | Admitting: *Deleted

## 2011-06-03 MED ORDER — CYCLOBENZAPRINE HCL 5 MG PO TABS: 5.0000 mg | ORAL_TABLET | Freq: Three times a day (TID) | ORAL | Status: AC | PRN

## 2011-06-03 NOTE — Telephone Encounter (Signed)
Pt is seeing a Chiropractor for an inflamed sciatic nerve, and is asking for a muscle relaxer to help him out with his pain.

## 2011-06-03 NOTE — Telephone Encounter (Signed)
Notified pt. 

## 2011-06-03 NOTE — Telephone Encounter (Signed)
Generic Flexeril 5 mg #30 one 3 times a day 

## 2011-06-07 ENCOUNTER — Telehealth: Payer: Self-pay | Admitting: *Deleted

## 2011-06-07 NOTE — Telephone Encounter (Signed)
ROV Dr Tawanna Cooler today

## 2011-06-07 NOTE — Telephone Encounter (Addendum)
Pt's back and leg pain is worse.  The Flexeril is no help at all, and is asking if he should go to a neurosurgeon?

## 2011-06-07 NOTE — Telephone Encounter (Signed)
Scheduled with Dr Tawanna Cooler.

## 2011-06-09 ENCOUNTER — Ambulatory Visit: Payer: BC Managed Care – PPO | Admitting: Family Medicine

## 2011-06-09 ENCOUNTER — Encounter: Payer: Self-pay | Admitting: Family Medicine

## 2011-06-09 ENCOUNTER — Ambulatory Visit (INDEPENDENT_AMBULATORY_CARE_PROVIDER_SITE_OTHER): Payer: BC Managed Care – PPO | Admitting: Family Medicine

## 2011-06-09 VITALS — Temp 98.7°F

## 2011-06-09 DIAGNOSIS — M5137 Other intervertebral disc degeneration, lumbosacral region: Secondary | ICD-10-CM

## 2011-06-09 MED ORDER — KETOROLAC TROMETHAMINE 60 MG/2ML IM SOLN
60.0000 mg | Freq: Four times a day (QID) | INTRAMUSCULAR | Status: AC | PRN
  Administered 2011-06-09: 60 mg via INTRAMUSCULAR

## 2011-06-09 MED ORDER — CYCLOBENZAPRINE HCL 10 MG PO TABS: 10.0000 mg | ORAL_TABLET | Freq: Three times a day (TID) | ORAL | Status: AC | PRN

## 2011-06-09 MED ORDER — OXYCODONE-ACETAMINOPHEN 5-325 MG PO TABS: 1.0000 | ORAL_TABLET | Freq: Three times a day (TID) | ORAL | Status: DC | PRN

## 2011-06-09 NOTE — Patient Instructions (Signed)
Stay at complete bed rest at home  Flexeril and Percocet,,,,,,,,,, one half to one tablet of each 3 times daily  MOM  Return Monday afternoon for followup

## 2011-06-09 NOTE — Progress Notes (Signed)
  Subjective:    Patient ID: Daniel Booth, male    DOB: [birth year], 56 y.o.   MRN: [id number]  HPI Daniel Booth is a 56 year old divorced male nonsmoker who comes in today for evaluation of back pain. He's had a history of cervical and lumbar degenerative disc disease. About 15 years ago he had a ruptured L4-L5 disc that ultimately healed with time and physical therapy.  A couple days ago he was driving and noticed pain in the right hip over couple hours the pain intensified and began to radiate down his leg. He has some tingling sensation in that leg however he feels that his muscle strength is normal. No history of trauma. No bowel nor bladder dysfunction  He describes the pain as constant dull and 11 on a scale of 1-10. It radiates down to his right leg. Her    Review of Systems General and neuromuscular review of systems negative except for noted above    Objective:   Physical Exam Well-developed well-nourished male in severe pain.  Examination of lower extremities in supine position shows the legs to be of equal length. Sensation muscle strength reflexes within normal limits. Positive straight leg raising left leg at 75       Assessment & Plan:  Lumbar disease plan bed rest redness on pain medicine followup on Tuesday or

## 2011-06-13 ENCOUNTER — Encounter: Payer: Self-pay | Admitting: Family Medicine

## 2011-06-13 ENCOUNTER — Ambulatory Visit (INDEPENDENT_AMBULATORY_CARE_PROVIDER_SITE_OTHER): Payer: BC Managed Care – PPO | Admitting: Family Medicine

## 2011-06-13 VITALS — BP 118/80 | Temp 97.8°F | Wt 197.0 lb

## 2011-06-13 DIAGNOSIS — M5137 Other intervertebral disc degeneration, lumbosacral region: Secondary | ICD-10-CM

## 2011-06-13 DIAGNOSIS — M51379 Other intervertebral disc degeneration, lumbosacral region without mention of lumbar back pain or lower extremity pain: Secondary | ICD-10-CM

## 2011-06-13 MED ORDER — OXYCODONE-ACETAMINOPHEN 5-325 MG PO TABS: 1.0000 | ORAL_TABLET | Freq: Three times a day (TID) | ORAL | Status: AC | PRN

## 2011-06-13 NOTE — Patient Instructions (Signed)
Take Motrin 600 mg twice daily with food and a half of a Flexeril and Percocet at bedtime  Tomorrow begin walking,,,,,,,,,, then come back and lie down no sitting  Okay to try to return to work half a day on Wednesday morning  We will get you set up for physical therapy ASAP  Return in 2 weeks for followup sooner if any problems

## 2011-06-13 NOTE — Progress Notes (Signed)
  Subjective:    Patient ID: Daniel Booth, male    DOB: 02-Oct-1955, 56 y.o.   MRN: [id number]  HPI Daniel Booth is a 56 year old divorced male nonsmoker who comes in today for followup of back pain  We saw him last Thursday with severe back pain. His pain was 11 on a scale of 1-10. We placed him at bed rest with Flexeril and Percocet and he comes in today for followup. He says his pain now is about 2. He still has some referred pain down his left leg. Also some tingling sensation in that left leg however no bowel nor bladder dysfunction walks normally no foot drop     Review of Systems    general and neurologic review of systems otherwise negative Objective:   Physical Exam Well-developed well-nourished male in no acute distress examination the legs in the supine position sews normal sensation strength reflexes except he has an absent left Achilles. Straight leg raising today negative       Assessment & Plan:  Lumbar disc disease improving plan slowly begin ambulation, physical therapy, taper off medication, return in 2 weeks

## 2011-06-22 ENCOUNTER — Ambulatory Visit: Payer: BC Managed Care – PPO | Attending: Family Medicine

## 2011-06-22 DIAGNOSIS — M545 Low back pain, unspecified: Secondary | ICD-10-CM | POA: Insufficient documentation

## 2011-06-22 DIAGNOSIS — M6281 Muscle weakness (generalized): Secondary | ICD-10-CM | POA: Insufficient documentation

## 2011-06-22 DIAGNOSIS — IMO0001 Reserved for inherently not codable concepts without codable children: Secondary | ICD-10-CM | POA: Insufficient documentation

## 2011-06-22 DIAGNOSIS — M25659 Stiffness of unspecified hip, not elsewhere classified: Secondary | ICD-10-CM | POA: Insufficient documentation

## 2011-06-27 ENCOUNTER — Encounter: Payer: Self-pay | Admitting: Family Medicine

## 2011-06-27 ENCOUNTER — Ambulatory Visit (INDEPENDENT_AMBULATORY_CARE_PROVIDER_SITE_OTHER): Payer: BC Managed Care – PPO | Admitting: Family Medicine

## 2011-06-27 VITALS — BP 120/84 | Temp 98.1°F | Wt 192.0 lb

## 2011-06-27 DIAGNOSIS — M5137 Other intervertebral disc degeneration, lumbosacral region: Secondary | ICD-10-CM

## 2011-06-28 ENCOUNTER — Encounter: Payer: Self-pay | Admitting: Family Medicine

## 2011-06-28 ENCOUNTER — Ambulatory Visit: Payer: BC Managed Care – PPO | Admitting: Family Medicine

## 2011-06-28 NOTE — Progress Notes (Signed)
  Subjective:    Patient ID: Daniel Booth, male    DOB: 1955-10-19, 56 y.o.   MRN: [id number]  HPI  Zayveon is a 56 year old male who comes in for evaluation of back pain  We saw him last week with severe back pain. We placed him in bed rest Flexeril and Vicodin and Motrin. He stated bedrest for 48 hours then begin physical therapy. He's done well he comes in today saying he is 100% pain free     Review of Systems General and neurologic review of systems otherwise negative    Objective:   Physical Exam Well-developed well-nourished male in no acute distress exam normal       Assessment & Plan:  Lumbar disc pain resolved with bed rest and physical therapy and medicine  Daily

## 2011-06-28 NOTE — Patient Instructions (Signed)
Dear daily exercises to prevent back pain

## 2011-06-30 ENCOUNTER — Ambulatory Visit: Payer: BC Managed Care – PPO | Admitting: Family Medicine

## 2011-06-30 ENCOUNTER — Ambulatory Visit: Payer: BC Managed Care – PPO | Admitting: Physical Therapy

## 2011-07-05 DIAGNOSIS — C439 Malignant melanoma of skin, unspecified: Secondary | ICD-10-CM

## 2011-07-05 HISTORY — DX: Malignant melanoma of skin, unspecified: C43.9

## 2011-07-06 ENCOUNTER — Ambulatory Visit: Payer: BC Managed Care – PPO | Attending: Family Medicine

## 2011-07-06 DIAGNOSIS — M545 Low back pain, unspecified: Secondary | ICD-10-CM | POA: Insufficient documentation

## 2011-07-06 DIAGNOSIS — M25659 Stiffness of unspecified hip, not elsewhere classified: Secondary | ICD-10-CM | POA: Insufficient documentation

## 2011-07-06 DIAGNOSIS — IMO0001 Reserved for inherently not codable concepts without codable children: Secondary | ICD-10-CM | POA: Insufficient documentation

## 2011-07-06 DIAGNOSIS — M6281 Muscle weakness (generalized): Secondary | ICD-10-CM | POA: Insufficient documentation

## 2011-07-15 ENCOUNTER — Encounter: Payer: BC Managed Care – PPO | Admitting: Physical Therapy

## 2011-07-19 ENCOUNTER — Ambulatory Visit: Payer: BC Managed Care – PPO

## 2011-09-04 ENCOUNTER — Other Ambulatory Visit: Payer: Self-pay | Admitting: Family Medicine

## 2011-10-12 ENCOUNTER — Ambulatory Visit (INDEPENDENT_AMBULATORY_CARE_PROVIDER_SITE_OTHER): Payer: BC Managed Care – PPO | Admitting: Family Medicine

## 2011-10-12 ENCOUNTER — Encounter: Payer: Self-pay | Admitting: Family Medicine

## 2011-10-12 ENCOUNTER — Telehealth: Payer: Self-pay | Admitting: Family Medicine

## 2011-10-12 VITALS — BP 110/80 | Temp 98.4°F | Wt 196.0 lb

## 2011-10-12 DIAGNOSIS — IMO0002 Reserved for concepts with insufficient information to code with codable children: Secondary | ICD-10-CM

## 2011-10-12 DIAGNOSIS — S83206A Unspecified tear of unspecified meniscus, current injury, right knee, initial encounter: Secondary | ICD-10-CM | POA: Insufficient documentation

## 2011-10-12 NOTE — Telephone Encounter (Signed)
Caller: Mikel/Patient; PCP: Kelle Darting A.; CB#: [phone];  Call regarding Rt Knee Swollen and Painful To Bend It.;  Onset: 10/05/11.  May have occurred playing tennis.  Extremely painful to squat or bend knee. Knee feels "crunchy." Advised to see MD now for extremitity swelling or limitation of ROM and taking blood thinner (ASA) per Knee Injury Guideline.  Appt scheduled for 10/12/11 at 1115 with Dr Tawanna Cooler.

## 2011-10-12 NOTE — Telephone Encounter (Signed)
noted 

## 2011-10-12 NOTE — Progress Notes (Signed)
  Subjective:    Patient ID: Daniel Booth, male    DOB: 03-18-1956, 56 y.o.   MRN: [id number]  HPI  Daniel Booth is a 56 year old male who comes in today for evaluation of pain in his right knee  He said last Wednesday had a good workout play tennis felt fine Thursday morning woke up and his right knee was swollen. No history of previous trauma  Review of Systems Gen. an orthopedic review of systems otherwise negative    Objective:   Physical Exam Well-developed well-nourished male in no acute distress examination of the knee shows an effusion of the right knee with palpable tenderness along the medial joint line.       Assessment & Plan:  Partial tear cartilage right knee medial,,,,,,,,,, elevation ice Motrin or so consult if symptoms persist

## 2011-10-12 NOTE — Patient Instructions (Signed)
Motrin 600 mg twice daily with food  Elevation and ice as much as possible  If the pain gets worse we can always order a knee immobilizer and put you on crutches for couple weeks  If you have persistent swelling after 4-6 weeks or ear knee locks then you'll need surgery call Norlene Campbell

## 2011-10-19 ENCOUNTER — Other Ambulatory Visit: Payer: Self-pay | Admitting: Family Medicine

## 2011-10-25 ENCOUNTER — Other Ambulatory Visit: Payer: Self-pay | Admitting: Physician Assistant

## 2011-12-03 ENCOUNTER — Other Ambulatory Visit: Payer: Self-pay | Admitting: Family Medicine

## 2012-03-17 ENCOUNTER — Other Ambulatory Visit: Payer: Self-pay | Admitting: Family Medicine

## 2012-05-07 ENCOUNTER — Telehealth: Payer: Self-pay | Admitting: Family Medicine

## 2012-05-07 NOTE — Telephone Encounter (Signed)
Patient Information:  Caller Name: Evren  Phone: [phone]  Patient: Daniel Booth, Daniel Booth  Gender: Male  DOB: 1956-01-25  Age: 57 Years  PCP: Kelle Darting [facility]  Office Follow Up:  Does the office need to follow up with this patient?: No  Instructions For The Office: N/A   Symptoms  Reason For Call & Symptoms: Itchy eyes, nagging cough worse at night.  Nasal secretions reported.  Reviewed Health History In EMR: Yes  Reviewed Medications In EMR: Yes  Reviewed Allergies In EMR: Yes  Reviewed Surgeries / Procedures: Yes  Date of Onset of Symptoms: 04/29/2012  Treatments Tried: Mucinex stops cough.  Flonase helps nasal symptoms.  Eye burning persists  Treatments Tried Worked: Yes  Guideline(s) Used:  Cold Symptoms (Upper Respiratory Infection)  Colds  Cough  Disposition Per Guideline:   See Today or Tomorrow in Office  Reason For Disposition Reached:   Continuous (nonstop) coughing interferes with work or school and no improvement using cough treatment per Care Advice  Advice Given:  Reassurance  Coughing is the way that our lungs remove irritants and mucus. It helps protect our lungs from getting pneumonia.  You can get a dry hacking cough after a chest cold. Sometimes this type of cough can last 1-3 weeks, and be worse at night.  Here is some care advice that should help.  Cough Medicines:  OTC Cough Syrups: The most common cough suppressant in OTC cough medications is dextromethorphan. Often the letters "DM" appear in the name.  OTC Cough Drops: Cough drops can help a lot, especially for mild coughs. They reduce coughing by soothing your irritated throat and removing that tickle sensation in the back of the throat. Cough drops also have the advantage of portability - you can carry them with you.  Home Remedy - Hard Candy: Hard candy works just as well as medicine-flavored OTC cough drops. Diabetics should use sugar-free candy.  Coughing Spasms:  Drink warm  fluids. Inhale warm mist (Reason: both relax the airway and loosen up the phlegm).  Suck on cough drops or hard candy to coat the irritated throat.  Prevent Dehydration:  Drink adequate liquids.  This will help soothe an irritated or dry throat and loosen up the phlegm.  Call Back If:  Difficulty breathing  Cough lasts more than 3 weeks  Fever lasts > 3 days  You become worse.  Appointment Scheduled:  05/08/2012 11:00:00 Appointment Scheduled Provider:  Kelle Darting [facility])

## 2012-05-08 ENCOUNTER — Encounter: Payer: Self-pay | Admitting: Family Medicine

## 2012-05-08 ENCOUNTER — Ambulatory Visit (INDEPENDENT_AMBULATORY_CARE_PROVIDER_SITE_OTHER): Payer: BC Managed Care – PPO | Admitting: Family Medicine

## 2012-05-08 VITALS — BP 140/96 | Temp 98.6°F | Wt 200.0 lb

## 2012-05-08 DIAGNOSIS — J45909 Unspecified asthma, uncomplicated: Secondary | ICD-10-CM

## 2012-05-08 DIAGNOSIS — J309 Allergic rhinitis, unspecified: Secondary | ICD-10-CM

## 2012-05-08 MED ORDER — PREDNISONE 20 MG PO TABS: ORAL_TABLET | ORAL | Status: DC
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
## 2012-05-08 NOTE — Patient Instructions (Addendum)
Take the prednisone as directed  While you're taking the prednisone you can stop the Flonase and Claritin  Return when necessary

## 2012-06-02 ENCOUNTER — Other Ambulatory Visit: Payer: Self-pay | Admitting: Family Medicine

## 2012-07-04 ENCOUNTER — Other Ambulatory Visit: Payer: Self-pay | Admitting: Physician Assistant

## 2012-07-19 ENCOUNTER — Other Ambulatory Visit: Payer: Self-pay | Admitting: Physician Assistant

## 2012-10-09 ENCOUNTER — Other Ambulatory Visit: Payer: Self-pay | Admitting: Family Medicine

## 2013-01-28 ENCOUNTER — Other Ambulatory Visit: Payer: Self-pay | Admitting: Family Medicine

## 2013-02-05 ENCOUNTER — Telehealth: Payer: Self-pay | Admitting: Family Medicine

## 2013-02-05 MED ORDER — SIMVASTATIN 20 MG PO TABS: ORAL_TABLET | ORAL | Status: DC

## 2013-02-05 NOTE — Telephone Encounter (Signed)
Patient needs refill on SIMVASTATIN, 90-day supply with refills if possible. Pharmacy has been trying to request the refill for 1 wk, but they are having computer issues, so we have never received the request. Patient has appt for cpx 05/13/2013,  He is requesting we fill today if at all possible.

## 2013-02-05 NOTE — Telephone Encounter (Signed)
Rx sent to pharmacy   

## 2013-04-22 ENCOUNTER — Other Ambulatory Visit (INDEPENDENT_AMBULATORY_CARE_PROVIDER_SITE_OTHER): Payer: 59

## 2013-04-22 DIAGNOSIS — Z0000 Encounter for general adult medical examination without abnormal findings: Secondary | ICD-10-CM

## 2013-04-22 LAB — LIPID PANEL
Cholesterol: 171 mg/dL (ref 0–200)
HDL: 54.9 mg/dL (ref >39.00)
LDL Cholesterol: 94 mg/dL (ref 0–99)
Triglycerides: 109 mg/dL (ref 0.0–149.0)
VLDL: 21.8 mg/dL (ref 0.0–40.0)

## 2013-04-22 LAB — CBC WITH DIFFERENTIAL/PLATELET
Basophils Absolute: 0 10*3/uL (ref 0.0–0.1)
Eosinophils Relative: 4.5 % (ref 0.0–5.0)
Lymphocytes Relative: 23.9 % (ref 12.0–46.0)
Monocytes Relative: 8 % (ref 3.0–12.0)
Neutrophils Relative %: 63.3 % (ref 43.0–77.0)
Platelets: 267 10*3/uL (ref 150.0–400.0)
RDW: 12.8 % (ref 11.5–14.6)
WBC: 5.6 10*3/uL (ref 4.5–10.5)

## 2013-04-22 LAB — POCT URINALYSIS DIPSTICK
Blood, UA: NEGATIVE
Ketones, UA: NEGATIVE
Protein, UA: NEGATIVE
Spec Grav, UA: 1.01
Urobilinogen, UA: 0.2
pH, UA: 6.5

## 2013-04-22 LAB — HEPATIC FUNCTION PANEL
AST: 31 U/L (ref 0–37)
Alkaline Phosphatase: 73 U/L (ref 39–117)
Bilirubin, Direct: 0.1 mg/dL (ref 0.0–0.3)
Total Protein: 6.6 g/dL (ref 6.0–8.3)

## 2013-04-22 LAB — BASIC METABOLIC PANEL
BUN: 19 mg/dL (ref 6–23)
Calcium: 9.5 mg/dL (ref 8.4–10.5)
GFR: 92.36 mL/min (ref >60.00)
Glucose, Bld: 99 mg/dL (ref 70–99)
Potassium: 5 mEq/L (ref 3.5–5.1)
Sodium: 141 mEq/L (ref 135–145)

## 2013-04-22 LAB — PSA: PSA: 0 ng/mL — ABNORMAL LOW (ref 0.10–4.00)

## 2013-05-08 ENCOUNTER — Telehealth: Payer: Self-pay | Admitting: Family Medicine
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
## 2013-05-13 ENCOUNTER — Encounter: Payer: Self-pay | Admitting: Family Medicine

## 2013-05-13 ENCOUNTER — Ambulatory Visit (INDEPENDENT_AMBULATORY_CARE_PROVIDER_SITE_OTHER): Payer: 59 | Admitting: Family Medicine

## 2013-05-13 VITALS — BP 110/80 | Temp 98.5°F | Ht 70.0 in | Wt 198.0 lb

## 2013-05-13 DIAGNOSIS — C61 Malignant neoplasm of prostate: Secondary | ICD-10-CM

## 2013-05-13 DIAGNOSIS — J309 Allergic rhinitis, unspecified: Secondary | ICD-10-CM

## 2013-05-13 DIAGNOSIS — E782 Mixed hyperlipidemia: Secondary | ICD-10-CM

## 2013-05-13 MED ORDER — TADALAFIL 5 MG PO TABS: 5.0000 mg | ORAL_TABLET | Freq: Every day | ORAL | Status: DC | PRN

## 2013-05-13 MED ORDER — SIMVASTATIN 20 MG PO TABS: ORAL_TABLET | ORAL | Status: DC

## 2013-05-13 MED ORDER — FLUTICASONE PROPIONATE 50 MCG/ACT NA SUSP: NASAL | Status: AC

## 2013-05-13 MED ORDER — TADALAFIL 20 MG PO TABS: ORAL_TABLET | ORAL | Status: DC

## 2013-05-13 MED ORDER — OMEPRAZOLE 20 MG PO CPDR: DELAYED_RELEASE_CAPSULE | ORAL | Status: DC

## 2013-05-13 NOTE — Patient Instructions (Signed)
Continue your current medication  The website is [email or web address] for the Cialis  If your GI symptoms persist or recur then I would recommend we get a GI consult.  Return in one year sooner if any problem

## 2013-05-13 NOTE — Progress Notes (Signed)
Pre visit review using our clinic review tool, if applicable. No additional management support is needed unless otherwise documented below in the visit note. 

## 2013-05-13 NOTE — Progress Notes (Signed)
Subjective:    Patient ID: Daniel Booth, male    DOB: [birth year], 58 y.o.   MRN: [id number]  HPI Daniel Booth is a 58 year old male who comes in today for general physical examination  He takes Cialis 20 mg when necessary for ED. His prostate removed 4 years ago for prostate cancer  He uses steroid nasal spray when necessary  He takes Claritin daily for allergic rhinitis  He takes Prilosec 20 mg daily because of chronic reflux esophagitis and Zocor 20 mg daily for hyperlipidemia  He recently went to [place] and before he returned he states he was having abdominal cramps bloating and bright red rectal bleeding. He had a complete workup at a clinic in [facility]. This thought he might have had some parasite therefore he put him on Flagyl and Cipro. He took the Flagyl 4 times daily for 3 days and the Cipro twice daily for 3 days then stop because of side effects from the medication. He feels better today. He says to [place] 15 times and never had a problem like this before   Review of Systems  Constitutional: Negative.   HENT: Negative.   Eyes: Negative.   Respiratory: Negative.   Cardiovascular: Negative.   Gastrointestinal: Negative.   Endocrine: Negative.   Genitourinary: Negative.   Musculoskeletal: Negative.   Skin: Negative.   Allergic/Immunologic: Negative.   Neurological: Negative.   Hematological: Negative.   Psychiatric/Behavioral: Negative.        Objective:   Physical Exam  Nursing note and vitals reviewed. Constitutional: He is oriented to person, place, and time. He appears well-developed and well-nourished.  HENT:  Head: Normocephalic and atraumatic.  Right Ear: External ear normal.  Left Ear: External ear normal.  Nose: Nose normal.  Mouth/Throat: Oropharynx is clear and moist.  Eyes: Conjunctivae and EOM are normal. Pupils are equal, round, and reactive to light.  Neck: Normal range of motion. Neck supple. No JVD present. No tracheal  deviation present. No thyromegaly present.  Cardiovascular: Normal rate, regular rhythm, normal heart sounds and intact distal pulses.  Exam reveals no gallop and no friction rub.   No murmur heard. Pulmonary/Chest: Effort normal and breath sounds normal. No stridor. No respiratory distress. He has no wheezes. He has no rales. He exhibits no tenderness.  Abdominal: Soft. Bowel sounds are normal. He exhibits no distension and no mass. There is no tenderness. There is no rebound and no guarding.  Genitourinary:  Prostate surgically removed checked twice yearly but urology  Musculoskeletal: Normal range of motion. He exhibits no edema and no tenderness.  Lymphadenopathy:    He has no cervical adenopathy.  Neurological: He is alert and oriented to person, place, and time. He has normal reflexes. No cranial nerve deficit. He exhibits normal muscle tone.  Skin: Skin is warm and dry. No rash noted. No erythema. No pallor.   Total body skin exam normal except for scar in the scalp in his right anterior thigh. He said he had 2 melanomas removed last year  Psychiatric: He has a normal mood and affect. His behavior is normal. Judgment and thought content normal.          Assessment & Plan:  Male  History of prostate cancer........ status post prostatectomy,,,,, Dr. Alinda Money has him on 5 mg of Cialis daily  Allergic rhinitis continue Claritin steroid nasal spray  Reflux esophagitis continue Prilosec  Hyperlipidemia continue Zocor and an aspirin tablet  Recent episode of abdominal pain and rectal bleeding  after a visit to [place] now symptoms have resolved.........Marland Kitchen

## 2013-05-27 ENCOUNTER — Ambulatory Visit (INDEPENDENT_AMBULATORY_CARE_PROVIDER_SITE_OTHER)
Admission: RE | Admit: 2013-05-27 | Discharge: 2013-05-27 | Disposition: A | Discharge status: Home / Self Care | Payer: 59 | Source: Ambulatory Visit | Attending: Family Medicine | Admitting: Family Medicine

## 2013-05-27 ENCOUNTER — Encounter: Payer: Self-pay | Admitting: Family Medicine

## 2013-05-27 ENCOUNTER — Other Ambulatory Visit: Payer: Self-pay | Admitting: Family Medicine

## 2013-05-27 ENCOUNTER — Ambulatory Visit (INDEPENDENT_AMBULATORY_CARE_PROVIDER_SITE_OTHER): Payer: 59 | Admitting: Family Medicine

## 2013-05-27 ENCOUNTER — Telehealth: Payer: Self-pay | Admitting: Family Medicine

## 2013-05-27 VITALS — BP 120/84 | Temp 98.4°F | Wt 191.0 lb

## 2013-05-27 DIAGNOSIS — R51 Headache: Secondary | ICD-10-CM

## 2013-05-27 DIAGNOSIS — R519 Headache, unspecified: Secondary | ICD-10-CM | POA: Insufficient documentation

## 2013-05-27 NOTE — Progress Notes (Signed)
Pre visit review using our clinic review tool, if applicable. No additional management support is needed unless otherwise documented below in the visit note. 
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
## 2013-05-27 NOTE — Patient Instructions (Signed)
Go directly to the main office now for your x-rays  I will call you the report

## 2013-05-27 NOTE — Telephone Encounter (Signed)
Pt would like results of xray done this am. pls call pt

## 2013-05-28 NOTE — Telephone Encounter (Signed)
Patient spoke with Dr Sherren Mocha about results

## 2013-09-30 ENCOUNTER — Encounter: Payer: Self-pay | Admitting: Internal Medicine

## 2013-10-11 ENCOUNTER — Other Ambulatory Visit: Payer: Self-pay | Admitting: Physician Assistant
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
## 2014-07-14 ENCOUNTER — Other Ambulatory Visit: Payer: Self-pay | Admitting: Family Medicine

## 2014-07-15 ENCOUNTER — Other Ambulatory Visit: Payer: Self-pay | Admitting: Family Medicine

## 2014-10-23 ENCOUNTER — Other Ambulatory Visit: Payer: Self-pay | Admitting: Family Medicine

## 2014-11-18 ENCOUNTER — Encounter: Payer: Self-pay | Admitting: Internal Medicine

## 2014-11-20 ENCOUNTER — Other Ambulatory Visit: Payer: Self-pay | Admitting: Physician Assistant

## 2014-12-01 ENCOUNTER — Other Ambulatory Visit: Payer: Self-pay | Admitting: Family Medicine

## 2014-12-24 ENCOUNTER — Encounter: Payer: Self-pay | Admitting: Gastroenterology

## 2015-02-08 ENCOUNTER — Other Ambulatory Visit: Payer: Self-pay | Admitting: Family Medicine

## 2015-02-11 ENCOUNTER — Telehealth: Payer: Self-pay | Admitting: Family Medicine

## 2015-02-11 NOTE — Telephone Encounter (Signed)
Used the 3:45 SDA slot on Monday for pt to come and and do a med fup with Dr Sherren Mocha

## 2015-02-14 ENCOUNTER — Other Ambulatory Visit: Payer: Self-pay | Admitting: Family Medicine

## 2015-02-16 ENCOUNTER — Ambulatory Visit: Payer: Self-pay | Admitting: Family Medicine

## 2015-02-16 ENCOUNTER — Other Ambulatory Visit: Payer: Self-pay | Admitting: Family Medicine

## 2015-02-23 ENCOUNTER — Ambulatory Visit (AMBULATORY_SURGERY_CENTER): Payer: Self-pay | Admitting: *Deleted

## 2015-02-23 VITALS — Ht 70.0 in | Wt 195.0 lb

## 2015-02-23 DIAGNOSIS — K2270 Barrett's esophagus without dysplasia: Secondary | ICD-10-CM

## 2015-02-23 NOTE — Progress Notes (Signed)
No egg or soy allergy No issues with past sedation- pt states in 2010 with EGD Dr Olevia Perches told pt hid vitals went low and she had to do chest rub or something in the room . There is no documentation of any issues with this egd in the procedure note. Had 2012 egd with propofol and no issues  No diet pills No home 02 use

## 2015-03-06 ENCOUNTER — Other Ambulatory Visit: Payer: Self-pay | Admitting: Physician Assistant

## 2015-03-09 ENCOUNTER — Encounter: Payer: Self-pay | Admitting: Gastroenterology

## 2015-03-09 ENCOUNTER — Ambulatory Visit (AMBULATORY_SURGERY_CENTER): Payer: 59 | Admitting: Gastroenterology

## 2015-03-09 VITALS — BP 122/85 | HR 69 | Temp 96.4°F | Resp 31 | Ht 70.0 in | Wt 195.0 lb

## 2015-03-09 DIAGNOSIS — K2270 Barrett's esophagus without dysplasia: Secondary | ICD-10-CM | POA: Diagnosis present

## 2015-03-09 DIAGNOSIS — K317 Polyp of stomach and duodenum: Secondary | ICD-10-CM

## 2015-03-09 MED ORDER — SODIUM CHLORIDE 0.9 % IV SOLN: 500.0000 mL | INTRAVENOUS | Status: DC

## 2015-03-09 NOTE — Progress Notes (Signed)
No problems noted in the recovery room. maw 

## 2015-03-09 NOTE — Op Note (Signed)
[place]  [address] [place], [id number]   ENDOSCOPY PROCEDURE REPORT  PATIENT: Daniel Booth, Daniel Booth  MR#: [id number] BIRTHDATE: [birth year] , 59  yrs. old GENDER: male ENDOSCOPIST: Yetta Flock, MD REFERRED BY: PROCEDURE DATE:  03/09/2015 PROCEDURE:  EGD w/ biopsy ASA CLASS:     Class II INDICATIONS:  history of Barrett's esophagus. MEDICATIONS: Propofol 160 mg IV TOPICAL ANESTHETIC:  DESCRIPTION OF PROCEDURE: After the risks benefits and alternatives of the procedure were thoroughly explained, informed consent was obtained.  The [id number] endoscope was introduced through the mouth and advanced to the second portion of the duodenum , Without limitations.  The instrument was slowly withdrawn as the mucosa was fully examined.  FINDINGS:The esophagus was normal in appearance.  The GEJ appeared normal.  No obvious evidence of Barrett's esophagus.  Biopsies were taken at the GEJ given reported history of Barrett's.  DH, GEJ, and SCJ located 39cm from the incisors.  The stomach was remarkable for a few 3-18mm gastric polyps noted in the body and fundus most consistent with benign fundic gland polyps.  A few were removed as a representative sample to confirm benign.  The rest of the stomach was normal.  Normal appearing duodenal bulb and 2nd portion of the duodenum.  Retroflexed views revealed no abnormalities.     The scope was then withdrawn from the patient and the procedure completed.  COMPLICATIONS: There were no immediate complications.  ENDOSCOPIC IMPRESSION: Normal esophagus and GEJ - no gross evidence of Barrett's but biopsies obtained at the GEJ Suspected benign fundic gland polyps of the stomach, removed as above Normal remainder of examined stomach and duodenum.    RECOMMENDATIONS: Resume diet Resume medications Await pathology results. If biopsies are normal, no further surveillance for Barrett's is warranted    eSigned:   Yetta Flock, MD 03/09/2015 9:14 AM    CC: the patient  PATIENT NAME:  [name], [name] MR#: [id number]

## 2015-03-09 NOTE — Patient Instructions (Signed)
YOU HAD AN ENDOSCOPIC PROCEDURE TODAY AT [facility]:   Refer to the procedure report that was given to you for any specific questions about what was found during the examination.  If the procedure report does not answer your questions, please call your gastroenterologist to clarify.  If you requested that your care partner not be given the details of your procedure findings, then the procedure report has been included in a sealed envelope for you to review at your convenience later.  YOU SHOULD EXPECT: Some feelings of bloating in the abdomen. Passage of more gas than usual.  Walking can help get rid of the air that was put into your GI tract during the procedure and reduce the bloating. If you had a lower endoscopy (such as a colonoscopy or flexible sigmoidoscopy) you may notice spotting of blood in your stool or on the toilet paper. If you underwent a bowel prep for your procedure, you may not have a normal bowel movement for a few days.  Please Note:  You might notice some irritation and congestion in your nose or some drainage.  This is from the oxygen used during your procedure.  There is no need for concern and it should clear up in a day or so.  SYMPTOMS TO REPORT IMMEDIATELY:  Following upper endoscopy (EGD)  Vomiting of blood or coffee ground material  New chest pain or pain under the shoulder blades  Painful or persistently difficult swallowing  New shortness of breath  Fever of 100F or higher  Black, tarry-looking stools  For urgent or emergent issues, a gastroenterologist can be reached at any hour by calling [phone].   DIET: Your first meal following the procedure should be a small meal and then it is ok to progress to your normal diet. Heavy or fried foods are harder to digest and may make you feel nauseous or bloated.  Likewise, meals heavy in dairy and vegetables can increase bloating.  Drink plenty of fluids but you should avoid alcoholic beverages for 24  hours.  ACTIVITY:  You should plan to take it easy for the rest of today and you should NOT DRIVE or use heavy machinery until tomorrow (because of the sedation medicines used during the test).    FOLLOW UP: Our staff will call the number listed on your records the next business day following your procedure to check on you and address any questions or concerns that you may have regarding the information given to you following your procedure. If we do not reach you, we will leave a message.  However, if you are feeling well and you are not experiencing any problems, there is no need to return our call.  We will assume that you have returned to your regular daily activities without incident.  If any biopsies were taken you will be contacted by phone or by letter within the next 1-3 weeks.  Please call us at [phone] if you have not heard about the biopsies in 3 weeks.    SIGNATURES/CONFIDENTIALITY: You and/or your care partner have signed paperwork which will be entered into your electronic medical record.  These signatures attest to the fact that that the information above on your After Visit Summary has been reviewed and is understood.  Full responsibility of the confidentiality of this discharge information lies with you and/or your care-partner.    Handout was given to your care partner on barrett's esophagus. You may resume your current medications today. Await biopsy  results. Please call if any questions or concerns.

## 2015-03-09 NOTE — Progress Notes (Signed)
A/ox3 pleased with MAC, report to Annette RN 

## 2015-03-09 NOTE — Progress Notes (Signed)
PT. Has area on right forearm and right leg,stated that it was skin cancer that was removed.

## 2015-03-09 NOTE — Progress Notes (Signed)
Called to room to assist during endoscopic procedure.  Patient ID and intended procedure confirmed with present staff. Received instructions for my participation in the procedure from the performing physician.  

## 2015-03-10 ENCOUNTER — Telehealth: Payer: Self-pay | Admitting: *Deleted

## 2015-03-10 NOTE — Telephone Encounter (Signed)
No answer, left message to call if questions or concerns. 

## 2015-04-13 ENCOUNTER — Encounter: Payer: Self-pay | Admitting: Family Medicine

## 2015-04-13 ENCOUNTER — Ambulatory Visit (INDEPENDENT_AMBULATORY_CARE_PROVIDER_SITE_OTHER): Payer: 59 | Admitting: Family Medicine

## 2015-04-13 VITALS — BP 130/90 | Temp 97.7°F | Wt 194.0 lb

## 2015-04-13 DIAGNOSIS — C61 Malignant neoplasm of prostate: Secondary | ICD-10-CM

## 2015-04-13 DIAGNOSIS — E782 Mixed hyperlipidemia: Secondary | ICD-10-CM | POA: Diagnosis not present

## 2015-04-13 DIAGNOSIS — Z23 Encounter for immunization: Secondary | ICD-10-CM

## 2015-04-13 DIAGNOSIS — Z8546 Personal history of malignant neoplasm of prostate: Secondary | ICD-10-CM

## 2015-04-13 DIAGNOSIS — K209 Esophagitis, unspecified without bleeding: Secondary | ICD-10-CM

## 2015-04-13 DIAGNOSIS — J302 Other seasonal allergic rhinitis: Secondary | ICD-10-CM

## 2015-04-13 DIAGNOSIS — Z0000 Encounter for general adult medical examination without abnormal findings: Secondary | ICD-10-CM

## 2015-04-13 LAB — POCT URINALYSIS DIPSTICK
Bilirubin, UA: NEGATIVE
Blood, UA: NEGATIVE
Glucose, UA: NEGATIVE
KETONES UA: NEGATIVE
LEUKOCYTES UA: NEGATIVE
NITRITE UA: NEGATIVE
PROTEIN UA: NEGATIVE
Spec Grav, UA: 1.025
UROBILINOGEN UA: 0.2
pH, UA: 6.5

## 2015-04-13 MED ORDER — OMEPRAZOLE 20 MG PO CPDR: 20.0000 mg | DELAYED_RELEASE_CAPSULE | Freq: Every day | ORAL | Status: DC

## 2015-04-13 MED ORDER — TADALAFIL 5 MG PO TABS: 5.0000 mg | ORAL_TABLET | Freq: Every day | ORAL | Status: DC | PRN

## 2015-04-13 MED ORDER — SIMVASTATIN 20 MG PO TABS: 20.0000 mg | ORAL_TABLET | Freq: Every day | ORAL | Status: DC

## 2015-04-13 NOTE — Progress Notes (Signed)
Pre visit review using our clinic review tool, if applicable. No additional management support is needed unless otherwise documented below in the visit note. 

## 2015-04-13 NOTE — Patient Instructions (Signed)
Continue current medications  Follow-up in one year sooner if any problems 

## 2015-04-13 NOTE — Progress Notes (Signed)
   Subjective:    Patient ID: Daniel Booth, male    DOB: [birth year], 59 y.o.   MRN: [id number]  HPI Daniel Booth is a 59 year old divorced male nonsmoker who comes in today for general physical examination because of a history of allergic rhinitis, reflux esophagitis and Barrett's esophagus, hyperlipidemia, mild ED secondary to prostate cancer 5 years ago  He had his prostate removed 5 years ago by Dr. Alinda Money. He had a checkup 6 months ago his PSA is still 0.  He uses steroid nasal spray and Claritin for allergic rhinitis  He takes Prilosec daily because of a history of chronic reflux and Barrett's esophagus  He takes Zocor 20 mg along with an aspirin daily because of hyperlipidemia  He takes Cialis 5 mg daily  He gets routine eye care, dental care, colonoscopy 2011 normal.  He still flu shot and a tetanus booster   Review of Systems  Constitutional: Negative.   HENT: Negative.   Eyes: Negative.   Respiratory: Negative.   Cardiovascular: Negative.   Gastrointestinal: Negative.   Endocrine: Negative.   Genitourinary: Negative.   Musculoskeletal: Negative.   Skin: Negative.   Allergic/Immunologic: Negative.   Neurological: Negative.   Hematological: Negative.   Psychiatric/Behavioral: Negative.        Objective:   Physical Exam  Constitutional: He is oriented to person, place, and time. He appears well-developed and well-nourished.  HENT:  Head: Normocephalic and atraumatic.  Right Ear: External ear normal.  Left Ear: External ear normal.  Nose: Nose normal.  Mouth/Throat: Oropharynx is clear and moist.  Eyes: Conjunctivae and EOM are normal. Pupils are equal, round, and reactive to light.  Neck: Normal range of motion. Neck supple. No JVD present. No tracheal deviation present. No thyromegaly present.  Cardiovascular: Normal rate, regular rhythm, normal heart sounds and intact distal pulses.  Exam reveals no gallop and no friction rub.   No murmur  heard. Pulmonary/Chest: Effort normal and breath sounds normal. No stridor. No respiratory distress. He has no wheezes. He has no rales. He exhibits no tenderness.  Abdominal: Soft. Bowel sounds are normal. He exhibits no distension and no mass. There is no tenderness. There is no rebound and no guarding.  Genitourinary:  Genitorectal exam done by Dr. Alinda Money therefore not repeated  Musculoskeletal: Normal range of motion. He exhibits no edema or tenderness.  Lymphadenopathy:    He has no cervical adenopathy.  Neurological: He is alert and oriented to person, place, and time. He has normal reflexes. No cranial nerve deficit. He exhibits normal muscle tone.  Skin: Skin is warm and dry. No rash noted. No erythema. No pallor.  Total body skin exam shows well-healed scar scalp from previous melanoma. He sees his dermatologist on a regular basis.  Psychiatric: He has a normal mood and affect. His behavior is normal. Judgment and thought content normal.  Nursing note and vitals reviewed.         Assessment & Plan:  Healthy male  Allergic rhinitis......... continue Claritin steroid nasal spray  Hyperlipidemia........... continue Zocor and aspirin  History of reflux esophagitis,,,,,,,,,,,, continue Prilosec  ED secondary to prostatectomy 5 years ago for cancer.....Marland Kitchen continue Cialis 5 mg daily

## 2015-04-14 LAB — HEPATIC FUNCTION PANEL
ALBUMIN: 4.9 g/dL (ref 3.5–5.2)
ALK PHOS: 86 U/L (ref 39–117)
ALT: 28 U/L (ref 0–53)
AST: 29 U/L (ref 0–37)
BILIRUBIN DIRECT: 0.1 mg/dL (ref 0.0–0.3)
TOTAL PROTEIN: 6.9 g/dL (ref 6.0–8.3)
Total Bilirubin: 0.5 mg/dL (ref 0.2–1.2)

## 2015-04-14 LAB — BASIC METABOLIC PANEL
BUN: 15 mg/dL (ref 6–23)
CALCIUM: 9.8 mg/dL (ref 8.4–10.5)
CO2: 32 meq/L (ref 19–32)
CREATININE: 1 mg/dL (ref 0.40–1.50)
Chloride: 104 mEq/L (ref 96–112)
GFR: 81.22 mL/min (ref >60.00)
GLUCOSE: 101 mg/dL — AB (ref 70–99)
Potassium: 5.1 mEq/L (ref 3.5–5.1)
SODIUM: 144 meq/L (ref 135–145)

## 2015-04-14 LAB — CBC WITH DIFFERENTIAL/PLATELET
BASOS ABS: 0.1 10*3/uL (ref 0.0–0.1)
Basophils Relative: 1 % (ref 0.0–3.0)
EOS PCT: 2.2 % (ref 0.0–5.0)
Eosinophils Absolute: 0.1 10*3/uL (ref 0.0–0.7)
HCT: 44.8 % (ref 39.0–52.0)
Hemoglobin: 14.8 g/dL (ref 13.0–17.0)
LYMPHS ABS: 2 10*3/uL (ref 0.7–4.0)
Lymphocytes Relative: 29.2 % (ref 12.0–46.0)
MCHC: 33 g/dL (ref 30.0–36.0)
MCV: 91.8 fl (ref 78.0–100.0)
MONO ABS: 0.5 10*3/uL (ref 0.1–1.0)
MONOS PCT: 6.7 % (ref 3.0–12.0)
NEUTROS ABS: 4.1 10*3/uL (ref 1.4–7.7)
NEUTROS PCT: 60.9 % (ref 43.0–77.0)
PLATELETS: 304 10*3/uL (ref 150.0–400.0)
RBC: 4.88 Mil/uL (ref 4.22–5.81)
RDW: 12.6 % (ref 11.5–15.5)
WBC: 6.7 10*3/uL (ref 4.0–10.5)

## 2015-04-14 LAB — LIPID PANEL
CHOL/HDL RATIO: 3
Cholesterol: 203 mg/dL — ABNORMAL HIGH (ref 0–200)
HDL: 70.1 mg/dL (ref >39.00)
LDL Cholesterol: 109 mg/dL — ABNORMAL HIGH (ref 0–99)
NonHDL: 133.39
TRIGLYCERIDES: 123 mg/dL (ref 0.0–149.0)
VLDL: 24.6 mg/dL (ref 0.0–40.0)

## 2015-04-14 LAB — TSH: TSH: 2.52 u[IU]/mL (ref 0.35–4.50)

## 2015-05-15 ENCOUNTER — Telehealth: Payer: Self-pay | Admitting: *Deleted

## 2015-05-15 NOTE — Telephone Encounter (Signed)
Patient is calling because he has a chest cough and head congestion. Left message on machine for patient to try Delsym , tylenol, and increase his liquid. Also suggested [facility] or call back next week if no improvement.

## 2015-06-24 ENCOUNTER — Encounter: Payer: Self-pay | Admitting: Internal Medicine

## 2015-12-14 ENCOUNTER — Other Ambulatory Visit: Payer: Self-pay | Admitting: Family Medicine

## 2015-12-14 DIAGNOSIS — C61 Malignant neoplasm of prostate: Secondary | ICD-10-CM

## 2015-12-27 ENCOUNTER — Other Ambulatory Visit: Payer: Self-pay | Admitting: Family Medicine

## 2015-12-27 DIAGNOSIS — C61 Malignant neoplasm of prostate: Secondary | ICD-10-CM

## 2016-02-24 ENCOUNTER — Other Ambulatory Visit: Payer: Self-pay | Admitting: Physician Assistant

## 2016-03-28 ENCOUNTER — Encounter (INDEPENDENT_AMBULATORY_CARE_PROVIDER_SITE_OTHER): Payer: Self-pay | Admitting: Orthopaedic Surgery

## 2016-03-28 ENCOUNTER — Ambulatory Visit (INDEPENDENT_AMBULATORY_CARE_PROVIDER_SITE_OTHER): Payer: 59 | Admitting: Orthopaedic Surgery

## 2016-03-28 VITALS — BP 121/90 | HR 74 | Resp 14 | Ht 70.0 in | Wt 195.0 lb

## 2016-03-28 DIAGNOSIS — M1612 Unilateral primary osteoarthritis, left hip: Secondary | ICD-10-CM

## 2016-03-28 NOTE — Progress Notes (Signed)
Office Visit Note   Patient: Daniel Booth           Date of Birth: Aug 27, 1955           MRN: [id number] Visit Date: 03/28/2016              Requested by: Dorena Cookey, MD [place], [place] [id number] PCP: Joycelyn Man, MD   Assessment & Plan: Visit Diagnoses: No diagnosis found. End-stage osteoarthritis left hip  Plan: Schedule left total hip replacement after medical clearance by Dr. Christie Nottingham I talked with Daniel Booth and his wife  well over 30 minutes regarding hip replacement. He would like to proceed. We have discussed the surgery, hospitalization and rehabilitation. Follow-Up Instructions: No Follow-up on file.   Orders:  No orders of the defined types were placed in this encounter.  No orders of the defined types were placed in this encounter.     Procedures: No procedures performed   Clinical Data: No additional findings.   Subjective: Chief Complaint  Patient presents with  . Left Hip - Pain    Pt last seen November 30, 2015 for left hip pain. Xrays obtained also that day. , Dr. Ernestina Patches gave him Left hip injection that day.  Pain has returned more severe the last 2 months.      Daniel Booth has been followed for several years in regards to the left hip pain. He has x-rays that demonstrate end-stage osteoarthritis. In the past he has had cortisone injections that initially lasted for almost a year. A more recent injection lasted approximately a month and a half. He's having significant compromise of his activities including sleeping walking and performing vocational and avocational  Activities. Medicines are really  of little benefit. He wishes to proceed with a total hip replacement. Review of Systems   Objective: Vital Signs: There were no vitals taken for this visit.  Physical Exam  Ortho Exam examination left hip reveals limited internal and external rotation associated with pain. Daniel Booth does walk with a limp. Neurovascular  exam is intact there is no swelling distally. Straight leg raise was negative  Specialty Comments:  No specialty comments available.  Imaging: No results found.   PMFS History: Patient Active Problem List   Diagnosis Date Noted  . Headache(784.0) 05/27/2013  . Prostate cancer ([place]) 06/29/2010  . Acute esophagitis 08/15/2007  . MIXED HYPERLIPIDEMIA 03/02/2007  . Allergic rhinitis 11/10/2006  . DEGENERATIVE DISC DISEASE, CERVICAL SPINE 11/10/2006  . DEGENERATIVE DISC DISEASE, LUMBAR SPINE 11/10/2006   Past Medical History:  Diagnosis Date  . Allergy   . Arthritis   . GERD (gastroesophageal reflux disease)    on omeprazole  . History of Barrett's esophagus   . Hyperlipidemia   . Prostate cancer [facility])     Family History  Problem Relation Age of Onset  . Colon cancer Neg Hx   . Esophageal cancer Neg Hx   . Rectal cancer Neg Hx   . Stomach cancer Neg Hx   . Colon polyps Father   . Diabetes Father     Past Surgical History:  Procedure Laterality Date  . COLONOSCOPY    . MOHS SURGERY Right    scalp  . PROSTATE SURGERY    . SHOULDER ARTHROSCOPY     right  . UPPER GASTROINTESTINAL ENDOSCOPY     Social History   Occupational History  . Not on file.   Social History Main Topics  . Smoking status: Former  Smoker  . Smokeless tobacco: Never Used  . Alcohol use 1.8 oz/week    3 Glasses of wine per week  . Drug use: No  . Sexual activity: Not on file

## 2016-04-22 ENCOUNTER — Telehealth (INDEPENDENT_AMBULATORY_CARE_PROVIDER_SITE_OTHER): Payer: Self-pay | Admitting: Orthopaedic Surgery

## 2016-04-22 NOTE — Telephone Encounter (Signed)
Daniel Booth from [facility] about orders needing to be put in for this pt. Surgery is 05/10/2016. Will be at [facility] for pre-op on 04/27/16, will be seeing you as well that day.

## 2016-04-26 ENCOUNTER — Encounter (HOSPITAL_COMMUNITY): Payer: Self-pay

## 2016-04-27 ENCOUNTER — Ambulatory Visit (INDEPENDENT_AMBULATORY_CARE_PROVIDER_SITE_OTHER): Payer: 59 | Admitting: Orthopedic Surgery

## 2016-04-27 ENCOUNTER — Encounter (INDEPENDENT_AMBULATORY_CARE_PROVIDER_SITE_OTHER): Payer: Self-pay | Admitting: Orthopedic Surgery

## 2016-04-27 ENCOUNTER — Encounter (HOSPITAL_COMMUNITY)
Admission: RE | Admit: 2016-04-27 | Discharge: 2016-04-27 | Disposition: A | Discharge status: Home / Self Care | Payer: 59 | Source: Ambulatory Visit | Attending: Orthopaedic Surgery | Admitting: Orthopaedic Surgery

## 2016-04-27 ENCOUNTER — Encounter (HOSPITAL_COMMUNITY): Payer: Self-pay

## 2016-04-27 ENCOUNTER — Other Ambulatory Visit: Payer: Self-pay | Admitting: Family Medicine

## 2016-04-27 VITALS — BP 108/76 | HR 78 | Resp 14 | Ht 70.0 in | Wt 195.0 lb

## 2016-04-27 DIAGNOSIS — Z01812 Encounter for preprocedural laboratory examination: Secondary | ICD-10-CM | POA: Diagnosis not present

## 2016-04-27 DIAGNOSIS — C61 Malignant neoplasm of prostate: Secondary | ICD-10-CM | POA: Diagnosis not present

## 2016-04-27 DIAGNOSIS — M5137 Other intervertebral disc degeneration, lumbosacral region: Secondary | ICD-10-CM | POA: Diagnosis not present

## 2016-04-27 DIAGNOSIS — M1612 Unilateral primary osteoarthritis, left hip: Secondary | ICD-10-CM | POA: Diagnosis not present

## 2016-04-27 DIAGNOSIS — R51 Headache: Secondary | ICD-10-CM | POA: Insufficient documentation

## 2016-04-27 DIAGNOSIS — E782 Mixed hyperlipidemia: Secondary | ICD-10-CM | POA: Insufficient documentation

## 2016-04-27 DIAGNOSIS — J309 Allergic rhinitis, unspecified: Secondary | ICD-10-CM | POA: Insufficient documentation

## 2016-04-27 DIAGNOSIS — M5030 Other cervical disc degeneration, unspecified cervical region: Secondary | ICD-10-CM | POA: Diagnosis not present

## 2016-04-27 HISTORY — DX: Personal history of urinary calculi: Z87.442

## 2016-04-27 HISTORY — DX: Adverse effect of unspecified anesthetic, initial encounter: T41.45XA

## 2016-04-27 LAB — BASIC METABOLIC PANEL
Anion gap: 6 (ref 5–15)
BUN: 18 mg/dL (ref 6–20)
CALCIUM: 9.3 mg/dL (ref 8.9–10.3)
CHLORIDE: 105 mmol/L (ref 101–111)
CO2: 29 mmol/L (ref 22–32)
CREATININE: 0.9 mg/dL (ref 0.61–1.24)
Glucose, Bld: 104 mg/dL — ABNORMAL HIGH (ref 65–99)
Potassium: 4.4 mmol/L (ref 3.5–5.1)
SODIUM: 140 mmol/L (ref 135–145)

## 2016-04-27 LAB — CBC
HCT: 44 % (ref 39.0–52.0)
Hemoglobin: 14.4 g/dL (ref 13.0–17.0)
MCH: 30.3 pg (ref 26.0–34.0)
MCHC: 32.7 g/dL (ref 30.0–36.0)
MCV: 92.4 fL (ref 78.0–100.0)
Platelets: 255 K/uL (ref 150–400)
RBC: 4.76 MIL/uL (ref 4.22–5.81)
RDW: 12.7 % (ref 11.5–15.5)
WBC: 6.4 K/uL (ref 4.0–10.5)

## 2016-04-27 LAB — SURGICAL PCR SCREEN
MRSA, PCR: NEGATIVE
Staphylococcus aureus: POSITIVE — AB

## 2016-04-27 MED ORDER — TRAMADOL HCL 50 MG PO TABS: 50.0000 mg | ORAL_TABLET | Freq: Four times a day (QID) | ORAL | 0 refills | Status: DC | PRN

## 2016-04-27 NOTE — Progress Notes (Signed)
No orders at preop appt.office not open yet. Labs done per anesthesia.Baker Janus to call patient to come back in for dr labs prior to surgery if possible.

## 2016-04-27 NOTE — Pre-Procedure Instructions (Addendum)
Daniel Booth  04/27/2016      [id number] - [place], [place] [address]. [address] [place] [address] Phone: [phone] Fax: [phone]    Your procedure is scheduled on 05/11/15  Report to [facility] Admitting at 530 A.M.  Call this number if you have problems the morning of surgery:  [phone]   Remember:  Do not eat food or drink liquids after midnight.  Take these medicines the morning of surgery with A SIP OF WATER  Omeprazole(prilosec)   STOP all herbel meds, nsaids (aleve,naproxen,advil,ibuprofen) starting 05/03/16 including aspirin, all vitamins, coq10,   Do not wear jewelry, make-up or nail polish.  Do not wear lotions, powders, or perfumes, or deoderant.  Do not shave 48 hours prior to surgery.  Men may shave face and neck.  Do not bring valuables to the hospital.  [facility] is not responsible for any belongings or valuables.  Contacts, dentures or bridgework may not be worn into surgery.  Leave your suitcase in the car.  After surgery it may be brought to your room.  For patients admitted to the hospital, discharge time will be determined by your treatment team.  Patients discharged the day of surgery will not be allowed to drive home.   Special instructions:  Special Instructions: [place] - Preparing for Surgery  Before surgery, you can play an important role.  Because skin is not sterile, your skin needs to be as free of germs as possible.  You can reduce the number of germs on you skin by washing with CHG (chlorahexidine gluconate) soap before surgery.  CHG is an antiseptic cleaner which kills germs and bonds with the skin to continue killing germs even after washing.  Please DO NOT use if you have an allergy to CHG or antibacterial soaps.  If your skin becomes reddened/irritated stop using the CHG and inform your nurse when you arrive at Short Stay.  Do not shave  (including legs and underarms) for at least 48 hours prior to the first CHG shower.  You may shave your face.  Please follow these instructions carefully:   1.  Shower with CHG Soap the night before surgery and the morning of Surgery.  2.  If you choose to wash your hair, wash your hair first as usual with your normal shampoo.  3.  After you shampoo, rinse your hair and body thoroughly to remove the Shampoo.  4.  Use CHG as you would any other liquid soap.  You can apply chg directly  to the skin and wash gently with scrungie or a clean washcloth.  5.  Apply the CHG Soap to your body ONLY FROM THE NECK DOWN.  Do not use on open wounds or open sores.  Avoid contact with your eyes ears, mouth and genitals (private parts).  Wash genitals (private parts)       with your normal soap.  6.  Wash thoroughly, paying special attention to the area where your surgery will be performed.  7.  Thoroughly rinse your body with warm water from the neck down.  8.  DO NOT shower/wash with your normal soap after using and rinsing off the CHG Soap.  9.  Pat yourself dry with a clean towel.            10.  Wear clean pajamas.            11.  Place clean  sheets on your bed the night of your first shower and do not sleep with pets.  Day of Surgery  Do not apply any lotions/deodorants the morning of surgery.  Please wear clean clothes to the hospital/surgery center.  Please read over the fact sheets that you were given.

## 2016-04-27 NOTE — Progress Notes (Signed)
Office Visit Note   Patient: Daniel Booth           Date of Birth: Oct 19, 1955           MRN: [id number] Visit Date: 04/27/2016              Requested by: Daniel Cookey, MD Daniel Booth, Daniel Booth [id number] PCP: Daniel Man, MD   Assessment & Plan: Visit Diagnoses:  1. Primary osteoarthritis of left hip     Plan: At this time our plan would be to proceed with a left total hip arthroplasty. 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 I have gone over with him the procedure risks and benefits of the operation shown him the prosthesis and have gone through and detailed surgery to the time of his discharge. He is understanding. I have given him a prescription for tramadol that he can use until surgery in 2 weeks.  Follow-Up Instructions: Return if symptoms worsen or fail to improve.   Orders:  No orders of the defined types were placed in this encounter.  Meds ordered this encounter  Medications  . traMADol (ULTRAM) 50 MG tablet    Sig: Take 1-2 tablets (50-100 mg total) by mouth every 6 (six) hours as needed.    Dispense:  30 tablet    Refill:  0    Order Specific Question:   Supervising Provider    Answer:   Daniel Booth [id number]      Procedures: No procedures performed   Clinical Data: No additional findings.   Subjective: Chief Complaint  Patient presents with  . Left Hip - Pain    [name] is a very pleasant who is seen today for evaluation of his left hip. Street dates back to late 2008 when he started developing left hip pain and pelvic pain. Any history of injury or trauma at that time but with the sudden onset was a constant and mild type pain but worsened with activities. Some inserts for shoes at that time due to foot pain.  MRI scan was ordered which revealed mild to moderate osteoarthritis of the hips bilaterally. He was only symptomatic on his left at that time. They used over-the-counter ibuprofen 800 mg 3 times a day with food.  He returned in February 2012 again having symptoms. He did have some changes worsening bulge in the left hip. Sent off to Dr. Ernestina Booth for evaluation. He was performed which apparently was beneficial until 2016. He also had a repeat injection in April 2016. He had done been doing well overall still having some symptoms but turned back in July of this year. He does play tennis 1-3 times week and also golfs occasionally. Has gotten to the point now where his pain was worsening. X-rays at that time revealed periarticular spurring around the femoral neck and he lacked joint space medially in the acetabular area. He also had loss of the joint space superiorly and medially. Repeat injection on 11/30/2015 was performed. The injection was on beneficial for about 6 weeks. He returns today for reevaluation and discussion of further treatment plans.   Pin in left hip not as bad today due to the prednisone dose pack    Review of Systems  HENT: Negative.  Eyes: Negative.   Respiratory: Negative.   Cardiovascular: Negative.   Gastrointestinal:       History of Barrett's esophagus  Endocrine: Negative.   Genitourinary:       History of prostate cancer with prostatectomy. History of renal calculi 10-12 years ago none since  Musculoskeletal: Negative.   Skin: Negative.   Neurological: Negative.   Hematological: Negative.   Psychiatric/Behavioral: Negative.      Objective: Vital Signs: BP 108/76   Pulse 78   Resp 14   Ht 5\' 10"  (1.778 m)   Wt 195 lb (88.5 kg)   BMI 27.98 kg/m   Physical Exam  Constitutional: He is oriented to person, place, and time. He appears well-developed and well-nourished.  HENT:  Head: Normocephalic and atraumatic.  Eyes: Conjunctivae and EOM are normal. Pupils  are equal, round, and reactive to light.  Neck: Neck supple.  No carotid bruits  Cardiovascular: Normal rate, regular rhythm, normal heart sounds and intact distal pulses.   Pulmonary/Chest: Effort normal and breath sounds normal.  Abdominal: Soft. Bowel sounds are normal. He exhibits no mass. There is no tenderness.  Neurological: He is alert and oriented to person, place, and time.  Skin: Skin is warm and dry.  Psychiatric: He has a normal mood and affect. His behavior is normal. Judgment and thought content normal.    Left Hip Exam   Range of Motion  Flexion: 100  Internal Rotation: 20  External Rotation: 20   Comments:  Painful range of motion      Specialty Comments:  No specialty comments available.  Imaging: No results found.   PMFS History: Patient Active Problem List   Diagnosis Date Noted  . Headache(784.0) 05/27/2013  . Prostate cancer ([place]) 06/29/2010  . Acute esophagitis 08/15/2007  . MIXED HYPERLIPIDEMIA 03/02/2007  . Allergic rhinitis 11/10/2006  . DEGENERATIVE DISC DISEASE, CERVICAL SPINE 11/10/2006  . DEGENERATIVE DISC DISEASE, LUMBAR SPINE 11/10/2006   Past Medical History:  Diagnosis Date  . Allergy   . Arthritis   . Complication of anesthesia 2011   "vitals dropped with endoscopy"  . GERD (gastroesophageal reflux disease)    on omeprazole  . History of Barrett's esophagus   . History of kidney stones    10-15 yrs ago  . Hyperlipidemia   . Prostate cancer [facility])     Family History  Problem Relation Age of Onset  . Colon polyps Father   . Diabetes Father   . Colon cancer Neg Hx   . Esophageal cancer Neg Hx   . Rectal cancer Neg Hx   . Stomach cancer Neg Hx     Past Surgical History:  Procedure Laterality Date  . COLONOSCOPY    . MOHS SURGERY Right    scalp  . PROSTATE SURGERY  2010  . SHOULDER ARTHROSCOPY  2007   right  . UPPER GASTROINTESTINAL ENDOSCOPY     Social History   Occupational History  . Not on file.   Social  History Main Topics  . Smoking status: Former Smoker    Packs/day: 0.25    Types: Cigarettes    Quit date: 04/27/1982  . Smokeless tobacco: Never Used  . Alcohol use 1.8 oz/week    3 Glasses of wine per week  . Drug use: No  . Sexual activity: Not on file

## 2016-04-29 ENCOUNTER — Telehealth (INDEPENDENT_AMBULATORY_CARE_PROVIDER_SITE_OTHER): Payer: Self-pay | Admitting: Orthopaedic Surgery

## 2016-04-29 ENCOUNTER — Other Ambulatory Visit (HOSPITAL_COMMUNITY): Payer: 59

## 2016-04-29 ENCOUNTER — Other Ambulatory Visit (INDEPENDENT_AMBULATORY_CARE_PROVIDER_SITE_OTHER): Payer: Self-pay

## 2016-04-29 MED ORDER — METHOCARBAMOL 500 MG PO TABS: 500.0000 mg | ORAL_TABLET | Freq: Three times a day (TID) | ORAL | 0 refills | Status: DC

## 2016-04-29 NOTE — Telephone Encounter (Signed)
Patient states he was given pain meds at his pre op appointment for the back pain he is having. But now he can barely walk and thinks he might need a muscle relaxer. Patient is requesting a call back today about his symptoms. Thank you!

## 2016-04-29 NOTE — Telephone Encounter (Signed)
done

## 2016-04-29 NOTE — Telephone Encounter (Signed)
Robaxin 500mg  #30 1 tab po tid prn[place] [place]

## 2016-04-29 NOTE — Telephone Encounter (Signed)
Please advise 

## 2016-05-02 HISTORY — PX: COLONOSCOPY: SHX174

## 2016-05-03 NOTE — Telephone Encounter (Signed)
Sent Friday.

## 2016-05-06 ENCOUNTER — Ambulatory Visit (HOSPITAL_COMMUNITY)
Admission: RE | Admit: 2016-05-06 | Discharge: 2016-05-06 | Disposition: A | Discharge status: Home / Self Care | Payer: 59 | Source: Ambulatory Visit | Attending: Orthopedic Surgery | Admitting: Orthopedic Surgery

## 2016-05-06 ENCOUNTER — Encounter (HOSPITAL_COMMUNITY)
Admission: RE | Admit: 2016-05-06 | Discharge: 2016-05-06 | Disposition: A | Discharge status: Home / Self Care | Payer: 59 | Source: Ambulatory Visit | Attending: Orthopaedic Surgery | Admitting: Orthopaedic Surgery

## 2016-05-06 DIAGNOSIS — Z01818 Encounter for other preprocedural examination: Secondary | ICD-10-CM | POA: Insufficient documentation

## 2016-05-06 DIAGNOSIS — M25852 Other specified joint disorders, left hip: Secondary | ICD-10-CM | POA: Diagnosis not present

## 2016-05-06 DIAGNOSIS — Z01812 Encounter for preprocedural laboratory examination: Secondary | ICD-10-CM | POA: Diagnosis present

## 2016-05-06 DIAGNOSIS — Z01810 Encounter for preprocedural cardiovascular examination: Secondary | ICD-10-CM | POA: Diagnosis present

## 2016-05-06 LAB — APTT: aPTT: 29 seconds (ref 24–36)

## 2016-05-06 LAB — CBC WITH DIFFERENTIAL/PLATELET
BASOS PCT: 0 %
Basophils Absolute: 0 10*3/uL (ref 0.0–0.1)
Eosinophils Absolute: 0.1 10*3/uL (ref 0.0–0.7)
Eosinophils Relative: 2 %
HEMATOCRIT: 45.5 % (ref 39.0–52.0)
HEMOGLOBIN: 15.1 g/dL (ref 13.0–17.0)
LYMPHS ABS: 1.4 10*3/uL (ref 0.7–4.0)
LYMPHS PCT: 21 %
MCH: 29.8 pg (ref 26.0–34.0)
MCHC: 33.2 g/dL (ref 30.0–36.0)
MCV: 89.9 fL (ref 78.0–100.0)
MONOS PCT: 6 %
Monocytes Absolute: 0.4 10*3/uL (ref 0.1–1.0)
NEUTROS ABS: 4.8 10*3/uL (ref 1.7–7.7)
NEUTROS PCT: 71 %
Platelets: 279 10*3/uL (ref 150–400)
RBC: 5.06 MIL/uL (ref 4.22–5.81)
RDW: 12.4 % (ref 11.5–15.5)
WBC: 6.7 10*3/uL (ref 4.0–10.5)

## 2016-05-06 LAB — COMPREHENSIVE METABOLIC PANEL
ALBUMIN: 4.5 g/dL (ref 3.5–5.0)
ALK PHOS: 77 U/L (ref 38–126)
ALT: 45 U/L (ref 17–63)
ANION GAP: 9 (ref 5–15)
AST: 35 U/L (ref 15–41)
BILIRUBIN TOTAL: 0.4 mg/dL (ref 0.3–1.2)
BUN: 13 mg/dL (ref 6–20)
CO2: 26 mmol/L (ref 22–32)
CREATININE: 0.97 mg/dL (ref 0.61–1.24)
Calcium: 9.6 mg/dL (ref 8.9–10.3)
Chloride: 105 mmol/L (ref 101–111)
GFR calc Af Amer: >60 mL/min (ref >60)
GFR calc non Af Amer: >60 mL/min (ref >60)
Glucose, Bld: 85 mg/dL (ref 65–99)
Potassium: 3.7 mmol/L (ref 3.5–5.1)
Sodium: 140 mmol/L (ref 135–145)
Total Protein: 7.1 g/dL (ref 6.5–8.1)

## 2016-05-06 LAB — TYPE AND SCREEN
ABO/RH(D): O POS
ANTIBODY SCREEN: NEGATIVE

## 2016-05-06 LAB — PROTIME-INR
INR: 0.96
PROTHROMBIN TIME: 12.7 s (ref 11.4–15.2)

## 2016-05-06 LAB — ABO/RH: ABO/RH(D): O POS

## 2016-05-07 LAB — URINE CULTURE
CULTURE: NO GROWTH
SPECIAL REQUESTS: NORMAL

## 2016-05-08 NOTE — H&P (Signed)
Patient: Daniel Booth                                      Date of Birth: 07/13/1955                                                   MRN: [id number] Visit Date: 04/27/2016                                                                     Requested by: Dorena Cookey, MD [place], [place] [address] PCP: Joycelyn Man, MD   Assessment & Plan: Visit Diagnoses:  1. Primary osteoarthritis of left hip     Plan: At this time our plan would be to proceed with a left total hip arthroplasty. 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 I have gone over with him the procedure risks and benefits of the operation shown him the prosthesis and have gone through and detailed surgery to the time of his discharge. He is understanding. I have given him a prescription for tramadol that he can use until surgery in 2 weeks.  Follow-Up Instructions: Return if symptoms worsen or fail to improve.        Procedures: No procedures performed   Clinical Data: No additional findings.   Subjective:    Chief Complaint  Patient presents with  . Left Hip - Pain    Daniel Booth is a very pleasant who is seen today for evaluation of his left hip. Street dates back to late 2008 when he started developing left hip pain and pelvic pain. Any history of injury or trauma at that time but with the sudden onset was a constant and mild type pain but worsened with activities. Some inserts for shoes at that time due to foot pain. MRI scan was ordered which revealed mild to moderate osteoarthritis of the hips bilaterally. He was only symptomatic on his left at that time. They used over-the-counter ibuprofen 800 mg 3 times a day with food.  He returned in February 2012 again  having symptoms. He did have some changes worsening bulge in the left hip. Sent off to Dr. Ernestina Patches for evaluation. He was performed which apparently was beneficial until 2016. He also had a repeat injection in April 2016. He had done been doing well overall still having some symptoms but turned back in July of this year. He does play tennis 1-3 times week and also golfs occasionally. Has gotten to the point now where his pain was worsening. X-rays at that time revealed periarticular spurring around the femoral neck and he lacked joint space medially in the acetabular area. He also had loss of the joint space superiorly and medially. Repeat injection on 11/30/2015 was performed. The injection was on beneficial for  about 6 weeks. He returns today for reevaluation and discussion of further treatment plans.   Pain in left hip not as bad today due to the prednisone dose pack   Review of Systems  HENT: Negative.   Eyes: Negative.   Respiratory: Negative.   Cardiovascular: Negative.   Gastrointestinal:       History of Barrett's esophagus  Endocrine: Negative.   Genitourinary:       History of prostate cancer with prostatectomy. History of renal calculi 10-12 years ago none since  Musculoskeletal: Negative.   Skin: Negative.   Neurological: Negative.   Hematological: Negative.   Psychiatric/Behavioral: Negative.      Objective: Vital Signs: BP 108/76   Pulse 78   Resp 14   Ht 5\' 10"  (1.778 m)   Wt 195 lb (88.5 kg)   BMI 27.98 kg/m   Physical Exam  Constitutional: He is oriented to person, place, and time. He appears well-developed and well-nourished.  HENT:  Head: Normocephalic and atraumatic.  Eyes: Conjunctivae and EOM are normal. Pupils are equal, round, and reactive to light.  Neck: Neck supple.  No carotid bruits  Cardiovascular: Normal rate, regular rhythm, normal heart sounds and intact distal pulses.   Pulmonary/Chest: Effort normal and breath sounds normal.   Abdominal: Soft. Bowel sounds are normal. He exhibits no mass. There is no tenderness.  Neurological: He is alert and oriented to person, place, and time.  Skin: Skin is warm and dry.  Psychiatric: He has a normal mood and affect. His behavior is normal. Judgment and thought content normal.    Left Hip Exam   Range of Motion  Flexion: 100  Internal Rotation: 20  External Rotation: 20   Comments:  Painful range of motion     Specialty Comments:  No specialty comments available.  Imaging: No results found.   PMFS History:     Patient Active Problem List   Diagnosis Date Noted  . Headache(784.0) 05/27/2013  . Prostate cancer ([place]) 06/29/2010  . Acute esophagitis 08/15/2007  . MIXED HYPERLIPIDEMIA 03/02/2007  . Allergic rhinitis 11/10/2006  . DEGENERATIVE DISC DISEASE, CERVICAL SPINE 11/10/2006  . DEGENERATIVE DISC DISEASE, LUMBAR SPINE 11/10/2006       Past Medical History:  Diagnosis Date  . Allergy   . Arthritis   . Complication of anesthesia 2011   "vitals dropped with endoscopy"  . GERD (gastroesophageal reflux disease)    on omeprazole  . History of Barrett's esophagus   . History of kidney stones    10-15 yrs ago  . Hyperlipidemia   . Prostate cancer [facility])          Family History  Problem Relation Age of Onset  . Colon polyps Father   . Diabetes Father   . Colon cancer Neg Hx   . Esophageal cancer Neg Hx   . Rectal cancer Neg Hx   . Stomach cancer Neg Hx          Past Surgical History:  Procedure Laterality Date  . COLONOSCOPY    . MOHS SURGERY Right    scalp  . PROSTATE SURGERY  2010  . SHOULDER ARTHROSCOPY  2007   right  . UPPER GASTROINTESTINAL ENDOSCOPY     Social History      Occupational History  . Not on file.        Social History Main Topics  . Smoking status: Former Smoker    Packs/day: 0.25    Types: Cigarettes    Quit date:  04/27/1982  . Smokeless tobacco: Never Used   . Alcohol use 1.8 oz/week    3 Glasses of wine per week  . Drug use: No  . Sexual activity: Not on file

## 2016-05-09 MED ORDER — ACETAMINOPHEN 10 MG/ML IV SOLN
1000.0000 mg | Freq: Once | INTRAVENOUS | Status: AC
  Administered 2016-05-10: 1000 mg via INTRAVENOUS
  Filled 2016-05-09: qty 100

## 2016-05-09 MED ORDER — TRANEXAMIC ACID 1000 MG/10ML IV SOLN
2000.0000 mg | INTRAVENOUS | Status: AC
  Administered 2016-05-10: 2000 mg via TOPICAL
  Filled 2016-05-09: qty 20

## 2016-05-10 ENCOUNTER — Encounter (HOSPITAL_COMMUNITY)
Admission: RE | Disposition: A | Discharge status: Home / Self Care | Payer: Self-pay | Source: Ambulatory Visit | Attending: Orthopaedic Surgery

## 2016-05-10 ENCOUNTER — Inpatient Hospital Stay (HOSPITAL_COMMUNITY): Payer: 59

## 2016-05-10 ENCOUNTER — Inpatient Hospital Stay (HOSPITAL_COMMUNITY)
Admission: RE | Admit: 2016-05-10 | Discharge: 2016-05-12 | DRG: 470 | Disposition: A | Discharge status: Home / Self Care | Payer: 59 | Source: Ambulatory Visit | Attending: Orthopaedic Surgery | Admitting: Orthopaedic Surgery

## 2016-05-10 ENCOUNTER — Encounter (HOSPITAL_COMMUNITY): Payer: Self-pay | Admitting: *Deleted

## 2016-05-10 ENCOUNTER — Inpatient Hospital Stay (HOSPITAL_COMMUNITY): Payer: 59 | Admitting: Certified Registered"

## 2016-05-10 DIAGNOSIS — Z87891 Personal history of nicotine dependence: Secondary | ICD-10-CM

## 2016-05-10 DIAGNOSIS — Z8546 Personal history of malignant neoplasm of prostate: Secondary | ICD-10-CM

## 2016-05-10 DIAGNOSIS — Z9889 Other specified postprocedural states: Secondary | ICD-10-CM

## 2016-05-10 DIAGNOSIS — Z96642 Presence of left artificial hip joint: Secondary | ICD-10-CM

## 2016-05-10 DIAGNOSIS — M1612 Unilateral primary osteoarthritis, left hip: Secondary | ICD-10-CM

## 2016-05-10 DIAGNOSIS — E785 Hyperlipidemia, unspecified: Secondary | ICD-10-CM | POA: Diagnosis present

## 2016-05-10 DIAGNOSIS — M25552 Pain in left hip: Secondary | ICD-10-CM | POA: Diagnosis present

## 2016-05-10 DIAGNOSIS — K219 Gastro-esophageal reflux disease without esophagitis: Secondary | ICD-10-CM | POA: Diagnosis present

## 2016-05-10 HISTORY — PX: TOTAL HIP ARTHROPLASTY: SHX124

## 2016-05-10 SURGERY — ARTHROPLASTY, HIP, TOTAL,POSTERIOR APPROACH
Anesthesia: General | Site: Hip | Laterality: Left

## 2016-05-10 MED ORDER — ONDANSETRON HCL 4 MG/2ML IJ SOLN: 4.0000 mg | Freq: Four times a day (QID) | INTRAMUSCULAR | Status: DC | PRN

## 2016-05-10 MED ORDER — SUGAMMADEX SODIUM 200 MG/2ML IV SOLN
INTRAVENOUS | Status: DC | PRN
  Administered 2016-05-10: 200 mg via INTRAVENOUS

## 2016-05-10 MED ORDER — FENTANYL CITRATE (PF) 100 MCG/2ML IJ SOLN
INTRAMUSCULAR | Status: AC
  Filled 2016-05-10: qty 2

## 2016-05-10 MED ORDER — PROMETHAZINE HCL 25 MG/ML IJ SOLN: 6.2500 mg | INTRAMUSCULAR | Status: DC | PRN

## 2016-05-10 MED ORDER — DOCUSATE SODIUM 100 MG PO CAPS
100.0000 mg | ORAL_CAPSULE | Freq: Two times a day (BID) | ORAL | Status: DC
  Administered 2016-05-10 – 2016-05-12 (×4): 100 mg via ORAL
  Filled 2016-05-10 (×4): qty 1

## 2016-05-10 MED ORDER — HYDROMORPHONE HCL 1 MG/ML IJ SOLN
INTRAMUSCULAR | Status: AC
  Filled 2016-05-10: qty 1

## 2016-05-10 MED ORDER — OXYCODONE HCL 5 MG PO TABS
5.0000 mg | ORAL_TABLET | ORAL | Status: DC | PRN
  Administered 2016-05-10 – 2016-05-11 (×4): 10 mg via ORAL
  Administered 2016-05-11: 5 mg via ORAL
  Administered 2016-05-12 (×3): 10 mg via ORAL
  Filled 2016-05-10 (×3): qty 2
  Filled 2016-05-10: qty 1
  Filled 2016-05-10 (×4): qty 2

## 2016-05-10 MED ORDER — METOCLOPRAMIDE HCL 5 MG PO TABS: 5.0000 mg | ORAL_TABLET | Freq: Three times a day (TID) | ORAL | Status: DC | PRN

## 2016-05-10 MED ORDER — SODIUM CHLORIDE 0.9 % IV SOLN
75.0000 mL/h | INTRAVENOUS | Status: DC
  Administered 2016-05-10: 75 mL/h via INTRAVENOUS

## 2016-05-10 MED ORDER — ACETAMINOPHEN 10 MG/ML IV SOLN
1000.0000 mg | Freq: Four times a day (QID) | INTRAVENOUS | Status: AC
  Administered 2016-05-10 – 2016-05-11 (×3): 1000 mg via INTRAVENOUS
  Filled 2016-05-10 (×3): qty 100

## 2016-05-10 MED ORDER — LACTATED RINGERS IV SOLN
INTRAVENOUS | Status: DC | PRN
  Administered 2016-05-10 (×2): via INTRAVENOUS

## 2016-05-10 MED ORDER — ONDANSETRON HCL 4 MG/2ML IJ SOLN
INTRAMUSCULAR | Status: DC | PRN
Start: 2016-05-10 — End: 2016-05-10
  Administered 2016-05-10: 4 mg via INTRAVENOUS

## 2016-05-10 MED ORDER — BUPIVACAINE HCL (PF) 0.25 % IJ SOLN
INTRAMUSCULAR | Status: AC
  Filled 2016-05-10: qty 30

## 2016-05-10 MED ORDER — GLYCOPYRROLATE 0.2 MG/ML IJ SOLN
INTRAMUSCULAR | Status: DC | PRN
  Administered 2016-05-10: 0.2 mg via INTRAVENOUS

## 2016-05-10 MED ORDER — PROPOFOL 10 MG/ML IV BOLUS
INTRAVENOUS | Status: DC | PRN
  Administered 2016-05-10: 200 mg via INTRAVENOUS

## 2016-05-10 MED ORDER — KETOROLAC TROMETHAMINE 15 MG/ML IJ SOLN
15.0000 mg | Freq: Four times a day (QID) | INTRAMUSCULAR | Status: AC
  Administered 2016-05-10 – 2016-05-11 (×4): 15 mg via INTRAVENOUS
  Filled 2016-05-10 (×4): qty 1

## 2016-05-10 MED ORDER — EPINEPHRINE PF 1 MG/ML IJ SOLN
INTRAMUSCULAR | Status: AC
  Filled 2016-05-10: qty 1

## 2016-05-10 MED ORDER — MAGNESIUM CITRATE PO SOLN: 1.0000 | Freq: Once | ORAL | Status: DC | PRN

## 2016-05-10 MED ORDER — ROCURONIUM BROMIDE 50 MG/5ML IV SOSY
PREFILLED_SYRINGE | INTRAVENOUS | Status: AC
  Filled 2016-05-10: qty 5

## 2016-05-10 MED ORDER — METHOCARBAMOL 500 MG PO TABS
500.0000 mg | ORAL_TABLET | Freq: Four times a day (QID) | ORAL | Status: DC | PRN
  Administered 2016-05-10 – 2016-05-12 (×5): 500 mg via ORAL
  Filled 2016-05-10 (×5): qty 1

## 2016-05-10 MED ORDER — EPHEDRINE 5 MG/ML INJ
INTRAVENOUS | Status: AC
  Filled 2016-05-10: qty 10

## 2016-05-10 MED ORDER — PANTOPRAZOLE SODIUM 40 MG PO TBEC
40.0000 mg | DELAYED_RELEASE_TABLET | Freq: Every day | ORAL | Status: DC
  Administered 2016-05-11 – 2016-05-12 (×2): 40 mg via ORAL
  Filled 2016-05-10 (×2): qty 1

## 2016-05-10 MED ORDER — MIDAZOLAM HCL 2 MG/2ML IJ SOLN
INTRAMUSCULAR | Status: AC
  Filled 2016-05-10: qty 2

## 2016-05-10 MED ORDER — METOCLOPRAMIDE HCL 5 MG/ML IJ SOLN: 5.0000 mg | Freq: Three times a day (TID) | INTRAMUSCULAR | Status: DC | PRN

## 2016-05-10 MED ORDER — BUPIVACAINE HCL 0.25 % IJ SOLN
INTRAMUSCULAR | Status: DC | PRN
  Administered 2016-05-10: 30 mL

## 2016-05-10 MED ORDER — CEFAZOLIN IN D5W 1 GM/50ML IV SOLN
1.0000 g | Freq: Four times a day (QID) | INTRAVENOUS | Status: AC
  Administered 2016-05-10 (×2): 1 g via INTRAVENOUS
  Filled 2016-05-10 (×2): qty 50

## 2016-05-10 MED ORDER — HYDROMORPHONE HCL 2 MG/ML IJ SOLN: 0.5000 mg | INTRAMUSCULAR | Status: DC | PRN

## 2016-05-10 MED ORDER — EPHEDRINE SULFATE 50 MG/ML IJ SOLN
INTRAMUSCULAR | Status: DC | PRN
  Administered 2016-05-10: 15 mg via INTRAVENOUS

## 2016-05-10 MED ORDER — ALUM & MAG HYDROXIDE-SIMETH 200-200-20 MG/5ML PO SUSP: 30.0000 mL | ORAL | Status: DC | PRN

## 2016-05-10 MED ORDER — CEFAZOLIN SODIUM-DEXTROSE 2-4 GM/100ML-% IV SOLN
2.0000 g | INTRAVENOUS | Status: AC
  Administered 2016-05-10: 2 g via INTRAVENOUS
  Filled 2016-05-10: qty 100

## 2016-05-10 MED ORDER — RIVAROXABAN 10 MG PO TABS
10.0000 mg | ORAL_TABLET | Freq: Every day | ORAL | Status: DC
  Administered 2016-05-11 – 2016-05-12 (×2): 10 mg via ORAL
  Filled 2016-05-10 (×2): qty 1

## 2016-05-10 MED ORDER — CHLORHEXIDINE GLUCONATE 4 % EX LIQD: 60.0000 mL | Freq: Once | CUTANEOUS | Status: DC

## 2016-05-10 MED ORDER — FENTANYL CITRATE (PF) 100 MCG/2ML IJ SOLN
INTRAMUSCULAR | Status: DC | PRN
  Administered 2016-05-10 (×2): 100 ug via INTRAVENOUS
  Administered 2016-05-10 (×2): 50 ug via INTRAVENOUS
  Administered 2016-05-10: 100 ug via INTRAVENOUS
  Administered 2016-05-10 (×2): 50 ug via INTRAVENOUS

## 2016-05-10 MED ORDER — ONDANSETRON HCL 4 MG/2ML IJ SOLN
INTRAMUSCULAR | Status: AC
  Filled 2016-05-10: qty 2

## 2016-05-10 MED ORDER — SODIUM CHLORIDE 0.9 % IR SOLN
Status: DC | PRN
  Administered 2016-05-10: 1000 mL
  Administered 2016-05-10: 3000 mL

## 2016-05-10 MED ORDER — METHOCARBAMOL 1000 MG/10ML IJ SOLN
500.0000 mg | Freq: Four times a day (QID) | INTRAMUSCULAR | Status: DC | PRN
  Filled 2016-05-10: qty 5

## 2016-05-10 MED ORDER — HYDROMORPHONE HCL 1 MG/ML IJ SOLN
0.2500 mg | INTRAMUSCULAR | Status: DC | PRN
  Administered 2016-05-10 (×4): 0.5 mg via INTRAVENOUS

## 2016-05-10 MED ORDER — MENTHOL 3 MG MT LOZG: 1.0000 | LOZENGE | OROMUCOSAL | Status: DC | PRN

## 2016-05-10 MED ORDER — LIDOCAINE 2% (20 MG/ML) 5 ML SYRINGE
INTRAMUSCULAR | Status: AC
  Filled 2016-05-10: qty 5

## 2016-05-10 MED ORDER — LORATADINE 10 MG PO TABS: 10.0000 mg | ORAL_TABLET | Freq: Every day | ORAL | Status: DC | PRN

## 2016-05-10 MED ORDER — SODIUM CHLORIDE 0.9 % IV SOLN: INTRAVENOUS | Status: DC

## 2016-05-10 MED ORDER — MIDAZOLAM HCL 2 MG/2ML IJ SOLN
INTRAMUSCULAR | Status: DC | PRN
Start: 2016-05-10 — End: 2016-05-10
  Administered 2016-05-10: 2 mg via INTRAVENOUS

## 2016-05-10 MED ORDER — MAGNESIUM HYDROXIDE 400 MG/5ML PO SUSP: 30.0000 mL | Freq: Every day | ORAL | Status: DC | PRN

## 2016-05-10 MED ORDER — PROPOFOL 10 MG/ML IV BOLUS
INTRAVENOUS | Status: AC
  Filled 2016-05-10: qty 20

## 2016-05-10 MED ORDER — FLUTICASONE PROPIONATE 50 MCG/ACT NA SUSP
1.0000 | Freq: Every day | NASAL | Status: DC | PRN
  Filled 2016-05-10: qty 16

## 2016-05-10 MED ORDER — SIMVASTATIN 20 MG PO TABS
20.0000 mg | ORAL_TABLET | Freq: Every day | ORAL | Status: DC
  Administered 2016-05-10 – 2016-05-11 (×2): 20 mg via ORAL
  Filled 2016-05-10 (×2): qty 1

## 2016-05-10 MED ORDER — CEFAZOLIN SODIUM 1 G IJ SOLR
INTRAMUSCULAR | Status: AC
  Filled 2016-05-10: qty 10

## 2016-05-10 MED ORDER — ONDANSETRON HCL 4 MG PO TABS: 4.0000 mg | ORAL_TABLET | Freq: Four times a day (QID) | ORAL | Status: DC | PRN

## 2016-05-10 MED ORDER — ROCURONIUM BROMIDE 100 MG/10ML IV SOLN
INTRAVENOUS | Status: DC | PRN
  Administered 2016-05-10: 50 mg via INTRAVENOUS

## 2016-05-10 MED ORDER — PHENOL 1.4 % MT LIQD: 1.0000 | OROMUCOSAL | Status: DC | PRN

## 2016-05-10 MED ORDER — LIDOCAINE HCL (CARDIAC) 20 MG/ML IV SOLN
INTRAVENOUS | Status: DC | PRN
  Administered 2016-05-10: 100 mg via INTRAVENOUS

## 2016-05-10 MED ORDER — ACETAMINOPHEN 325 MG PO TABS
650.0000 mg | ORAL_TABLET | Freq: Four times a day (QID) | ORAL | Status: DC | PRN
  Administered 2016-05-11 – 2016-05-12 (×2): 650 mg via ORAL
  Filled 2016-05-10 (×2): qty 2

## 2016-05-10 MED ORDER — ACETAMINOPHEN 650 MG RE SUPP: 650.0000 mg | Freq: Four times a day (QID) | RECTAL | Status: DC | PRN

## 2016-05-10 MED ORDER — DEXAMETHASONE SODIUM PHOSPHATE 10 MG/ML IJ SOLN
INTRAMUSCULAR | Status: AC
  Filled 2016-05-10: qty 1

## 2016-05-10 MED ORDER — DIPHENHYDRAMINE HCL 12.5 MG/5ML PO ELIX: 12.5000 mg | ORAL_SOLUTION | ORAL | Status: DC | PRN

## 2016-05-10 MED ORDER — BISACODYL 10 MG RE SUPP: 10.0000 mg | Freq: Every day | RECTAL | Status: DC | PRN

## 2016-05-10 SURGICAL SUPPLY — 58 items
BAG DECANTER FOR FLEXI CONT (MISCELLANEOUS) ×3 IMPLANT
BLADE SAW SAG 73X25 THK (BLADE) ×2
BLADE SAW SGTL 73X25 THK (BLADE) ×1 IMPLANT
CAPT HIP TOTAL 2 ×3 IMPLANT
COVER SURGICAL LIGHT HANDLE (MISCELLANEOUS) ×3 IMPLANT
COVER TABLE BACK 60X90 (DRAPES) ×3 IMPLANT
DECANTER SPIKE VIAL GLASS SM (MISCELLANEOUS) ×3 IMPLANT
DRAPE IMP U-DRAPE 54X76 (DRAPES) ×3 IMPLANT
DRAPE INCISE IOBAN 66X45 STRL (DRAPES) ×3 IMPLANT
DRAPE ORTHO SPLIT 77X108 STRL (DRAPES) ×4
DRAPE SURG ORHT 6 SPLT 77X108 (DRAPES) ×2 IMPLANT
DRSG MEPILEX BORDER 4X12 (GAUZE/BANDAGES/DRESSINGS) ×3 IMPLANT
DRSG MEPILEX BORDER 4X8 (GAUZE/BANDAGES/DRESSINGS) ×3 IMPLANT
DURAPREP 26ML APPLICATOR (WOUND CARE) ×6 IMPLANT
ELECT BLADE 6.5 EXT (BLADE) ×6 IMPLANT
ELECT REM PT RETURN 9FT ADLT (ELECTROSURGICAL) ×3
ELECTRODE REM PT RTRN 9FT ADLT (ELECTROSURGICAL) ×1 IMPLANT
EVACUATOR 1/8 PVC DRAIN (DRAIN) IMPLANT
FACESHIELD WRAPAROUND (MASK) ×6 IMPLANT
GLOVE BIOGEL PI IND STRL 6.5 (GLOVE) ×1 IMPLANT
GLOVE BIOGEL PI IND STRL 8 (GLOVE) ×2 IMPLANT
GLOVE BIOGEL PI IND STRL 8.5 (GLOVE) ×1 IMPLANT
GLOVE BIOGEL PI INDICATOR 6.5 (GLOVE) ×2
GLOVE BIOGEL PI INDICATOR 8 (GLOVE) ×4
GLOVE BIOGEL PI INDICATOR 8.5 (GLOVE) ×2
GLOVE ECLIPSE 8.0 STRL XLNG CF (GLOVE) ×6 IMPLANT
GLOVE SURG ORTHO 8.5 STRL (GLOVE) ×6 IMPLANT
GLOVE SURG SS PI 6.5 STRL IVOR (GLOVE) ×3 IMPLANT
GOWN STRL REUS W/ TWL LRG LVL3 (GOWN DISPOSABLE) ×1 IMPLANT
GOWN STRL REUS W/TWL 2XL LVL3 (GOWN DISPOSABLE) ×6 IMPLANT
GOWN STRL REUS W/TWL LRG LVL3 (GOWN DISPOSABLE) ×2
HANDPIECE INTERPULSE COAX TIP (DISPOSABLE)
IMMOBILIZER KNEE 22 UNIV (SOFTGOODS) ×3 IMPLANT
KIT BASIN OR (CUSTOM PROCEDURE TRAY) ×3 IMPLANT
KIT ROOM TURNOVER OR (KITS) ×3 IMPLANT
MANIFOLD NEPTUNE II (INSTRUMENTS) ×3 IMPLANT
NEEDLE 22X1 1/2 (OR ONLY) (NEEDLE) ×3 IMPLANT
NS IRRIG 1000ML POUR BTL (IV SOLUTION) ×3 IMPLANT
PACK TOTAL JOINT (CUSTOM PROCEDURE TRAY) ×3 IMPLANT
PAD ARMBOARD 7.5X6 YLW CONV (MISCELLANEOUS) ×3 IMPLANT
SET HNDPC FAN SPRY TIP SCT (DISPOSABLE) IMPLANT
SLEEVE SURGEON STRL (DRAPES) ×3 IMPLANT
STAPLER VISISTAT 35W (STAPLE) IMPLANT
SUCTION FRAZIER HANDLE 10FR (MISCELLANEOUS) ×2
SUCTION TUBE FRAZIER 10FR DISP (MISCELLANEOUS) ×1 IMPLANT
SUT BONE WAX W31G (SUTURE) ×3 IMPLANT
SUT ETHIBOND NAB CT1 #1 30IN (SUTURE) ×6 IMPLANT
SUT MNCRL AB 3-0 PS2 18 (SUTURE) ×3 IMPLANT
SUT VIC AB 0 CT1 27 (SUTURE) ×6
SUT VIC AB 0 CT1 27XBRD ANBCTR (SUTURE) ×3 IMPLANT
SUT VIC AB 1 CT1 27 (SUTURE) ×4
SUT VIC AB 1 CT1 27XBRD ANBCTR (SUTURE) ×2 IMPLANT
SUT VIC AB 2-0 CT1 27 (SUTURE) ×4
SUT VIC AB 2-0 CT1 TAPERPNT 27 (SUTURE) ×2 IMPLANT
SYR CONTROL 10ML LL (SYRINGE) ×3 IMPLANT
TOWEL OR 17X24 6PK STRL BLUE (TOWEL DISPOSABLE) ×3 IMPLANT
TOWEL OR 17X26 10 PK STRL BLUE (TOWEL DISPOSABLE) ×3 IMPLANT
YANKAUER SUCT BULB TIP NO VENT (SUCTIONS) ×3 IMPLANT

## 2016-05-10 NOTE — Anesthesia Procedure Notes (Signed)
Procedure Name: Intubation Date/Time: 05/10/2016 7:24 AM Performed by: Sampson Si E Pre-anesthesia Checklist: Patient identified, Emergency Drugs available, Suction available and Patient being monitored Patient Re-evaluated:Patient Re-evaluated prior to inductionOxygen Delivery Method: Circle System Utilized Preoxygenation: Pre-oxygenation with 100% oxygen Intubation Type: IV induction Ventilation: Mask ventilation without difficulty Laryngoscope Size: Mac and 4 Grade View: Grade I Tube type: Oral Tube size: 7.5 mm Number of attempts: 1 Airway Equipment and Method: Stylet and Oral airway Placement Confirmation: ETT inserted through vocal cords under direct vision,  positive ETCO2 and breath sounds checked- equal and bilateral Secured at: 22 cm Tube secured with: Tape Dental Injury: Teeth and Oropharynx as per pre-operative assessment

## 2016-05-10 NOTE — Progress Notes (Signed)
Orthopedic Tech Progress Note Patient Details:  Daniel Booth 02-Nov-1955 [id number]  Ortho Devices Type of Ortho Device: Knee Immobilizer Ortho Device/Splint Interventions: Veleta Miners 05/10/2016, 1:39 PM

## 2016-05-10 NOTE — Evaluation (Signed)
Physical Therapy Evaluation Patient Details Name: Daniel Booth MRN: [id number] DOB: 1955/09/08 Today's Date: 05/10/2016   History of Present Illness  Admitted for LTHA, Posterior Prec, WBAT;  has a pertinent past medical history of Allergy; Arthritis; Complication of anesthesia (2011); and Prostate cancer ([place]).  has a past surgical history that includes Prostate surgery (2010); Shoulder arthroscopy; and Mohs surgery (Right).  Clinical Impression   Pt is s/p THA resulting in the deficits listed below (see PT Problem List). Noting decr activity tolerance this POD 0 session, with what was likely orthostatic hypotension; I do anticipate good progress tomorrow;  Pt will benefit from skilled PT to increase their independence and safety with mobility to allow discharge to the venue listed below.     05/10/16 1630 05/10/16 1634  Vital Signs  Pulse Rate 75 85  BP (!) 87/59 91/65  Patient Position (if appropriate) Lying (after sitting EOB, symptomatic for lightheadedness) Lying        Follow Up Recommendations Home health PT;Supervision/Assistance - 24 hour    Equipment Recommendations  3in1 (PT)    Recommendations for Other Services OT consult     Precautions / Restrictions Precautions Precautions: Posterior Hip Precaution Booklet Issued: Yes (comment) Precaution Comments: Educated in Posterior Hip Precautions; Watch BP -- was likely orthostatic on PT eval Required Braces or Orthoses: Knee Immobilizer - Left Knee Immobilizer - Left: Other (comment) (in bed) Restrictions Weight Bearing Restrictions: Yes LLE Weight Bearing: Weight bearing as tolerated      Mobility  Bed Mobility Overal bed mobility: Needs Assistance Bed Mobility: Supine to Sit;Sit to Supine     Supine to sit: Min assist Sit to supine: Min assist   General bed mobility comments: Cues for technique; Increased time to get up; Laid back down rather quickly because of lightheadedness; close guard for post hip  prec  Transfers                 General transfer comment: Unable due to lightheadedness while sitting EOB  Ambulation/Gait             General Gait Details: Unable due to lightheadedness while sitting EOB  Stairs            Wheelchair Mobility    Modified Rankin (Stroke Patients Only)       Balance                                             Pertinent Vitals/Pain Pain Assessment: Faces Faces Pain Scale: Hurts even more Pain Location: L hip with motion Pain Descriptors / Indicators: Aching Pain Intervention(s): Monitored during session    Home Living Family/patient expects to be discharged to:: Private residence Living Arrangements: Spouse/significant other Available Help at Discharge: Family;Available 24 hours/day Type of Home: House Home Access: Stairs to enter   [employer] of Steps: 1 Home Layout: Two level;Able to live on main level with bedroom/bathroom Home Equipment: Walker - 2 wheels      Prior Function Level of Independence: Independent               Hand Dominance        Extremity/Trunk Assessment   Upper Extremity Assessment Upper Extremity Assessment: Overall WFL for tasks assessed    Lower Extremity Assessment Lower Extremity Assessment: LLE deficits/detail LLE Deficits / Details: Grossly decr AROM and strength L hip, limited by  pain postop       Communication   Communication: No difficulties  Cognition Arousal/Alertness: Awake/alert Behavior During Therapy: WFL for tasks assessed/performed Overall Cognitive Status: Within Functional Limits for tasks assessed                      General Comments      Exercises Total Joint Exercises Quad Sets: AROM;Left;5 reps Gluteal Sets: AROM;Both;5 reps Towel Squeeze: AROM;Left;5 reps Heel Slides: AAROM;Left;5 reps Hip ABduction/ADduction: AAROM;Left;5 reps   Assessment/Plan    PT Assessment Patient needs continued PT services   PT Problem List Decreased strength;Decreased range of motion;Decreased activity tolerance;Decreased balance;Decreased mobility;Decreased knowledge of use of DME;Decreased knowledge of precautions;Pain          PT Treatment Interventions DME instruction;Gait training;Stair training;Functional mobility training;Therapeutic activities;Therapeutic exercise;Patient/family education    PT Goals (Current goals can be found in the Care Plan section)  Acute Rehab PT Goals Patient Stated Go[place] PT Goal Formulation: With patient Time For Goal Achievement: 05/17/16 Potential to Achieve Goals: Good    Frequency 7X/week   Barriers to discharge        Co-evaluation               End of Session   Activity Tolerance: Other (comment) (Limited by near syncope, likely orthostatic hypotension) Patient left: in bed;with call bell/phone within reach;with family/visitor present           Time: 1554-1630 PT Time Calculation (min) (ACUTE ONLY): 36 min   Charges:   PT Evaluation $PT Eval Moderate Complexity: 1 Procedure PT Treatments $Therapeutic Activity: 8-22 mins   PT G CodesColletta [place] 05/10/2016, 4:48 PM  Roney Marion, [place] Pager [phone] Office [phone]

## 2016-05-10 NOTE — Op Note (Signed)
PATIENT ID:      Daniel Booth  MRN:     [id number] DOB/AGE:    March 12, 1956 / 61 y.o.       OPERATIVE REPORT    DATE OF PROCEDURE:  05/10/2016       PREOPERATIVE DIAGNOSIS: END STAGE  LEFT HIP OSTEOARTHRITIS                                                       Estimated body mass index is 28.41 kg/m as calculated from the following:   Height as of this encounter: 5\' 10"  (1.778 m).   Weight as of this encounter: 198 lb (89.8 kg).     POSTOPERATIVE DIAGNOSIS: END STAGE  LEFT HIP OSTEOARTHRITIS                                                                     Estimated body mass index is 28.41 kg/m as calculated from the following:   Height as of this encounter: 5\' 10"  (1.778 m).   Weight as of this encounter: 198 lb (89.8 kg).     PROCEDURE:  Procedure(s):LEFTTOTAL HIP ARTHROPLASTY      SURGEON:  Joni Fears, MD    ASSISTANT:   Biagio Borg, PA-C   (Present and scrubbed throughout the case, critical for assistance with exposure, retraction, instrumentation, and closure.)          ANESTHESIA: general     DRAINS: none :      TOURNIQUET TIME: * No tourniquets in log *    COMPLICATIONS:  None   CONDITION:  stable  PROCEDURE IN DETAIL: [place] 05/10/2016, 9:16 AM

## 2016-05-10 NOTE — Transfer of Care (Signed)
Immediate Anesthesia Transfer of Care Note  Patient: Daniel Booth  Procedure(s) Performed: Procedure(s): TOTAL HIP ARTHROPLASTY (Left)  Patient Location: PACU  Anesthesia Type:General  Level of Consciousness: awake and oriented  Airway & Oxygen Therapy: Patient Spontanous Breathing and Patient connected to nasal cannula oxygen  Post-op Assessment: Report given to RN  Post vital signs: Reviewed and stable  Last Vitals:  Vitals:   05/10/16 0551  BP: 113/88  Pulse: 82  Resp: 20  Temp: 36.8 C    Last Pain:  Vitals:   05/10/16 0600  TempSrc:   PainSc: 5          Complications: No apparent anesthesia complications

## 2016-05-10 NOTE — Anesthesia Preprocedure Evaluation (Addendum)
Anesthesia Evaluation  Patient identified by MRN, date of birth, ID band Patient awake    Airway Mallampati: II  TM Distance: >3 FB Neck ROM: Full    Dental  (+) Teeth Intact, Dental Advisory Given   Pulmonary neg pulmonary ROS, former smoker,    breath sounds clear to auscultation       Cardiovascular negative cardio ROS   Rhythm:Regular Rate:Normal     Neuro/Psych  Headaches,    GI/Hepatic GERD  ,  Endo/Other    Renal/GU      Musculoskeletal  (+) Arthritis , DJD cerv and lumbar spine   Abdominal   Peds  Hematology   Anesthesia Other Findings   Reproductive/Obstetrics                           Anesthesia Physical Anesthesia Plan  ASA: II  Anesthesia Plan: General   Post-op Pain Management:    Induction: Intravenous  Airway Management Planned:   Additional Equipment:   Intra-op Plan:   Post-operative Plan: Extubation in OR  Informed Consent: I have reviewed the patients History and Physical, chart, labs and discussed the procedure including the risks, benefits and alternatives for the proposed anesthesia with the patient or authorized representative who has indicated his/her understanding and acceptance.     Plan Discussed with: CRNA  Anesthesia Plan Comments:         Anesthesia Quick Evaluation

## 2016-05-10 NOTE — H&P (Signed)
The recent History & Physical has been reviewed. I have personally examined the patient today. There is no interval change to the documented History & Physical. The patient would like to proceed with the procedure.  Garald Balding 05/10/2016,  7:08 AM

## 2016-05-10 NOTE — Anesthesia Postprocedure Evaluation (Addendum)
Anesthesia Post Note  Patient: Daniel Booth  Procedure(s) Performed: Procedure(s) (LRB): TOTAL HIP ARTHROPLASTY (Left)  Patient location during evaluation: PACU Anesthesia Type: General Level of consciousness: awake and alert and awake Pain management: pain level controlled Vital Signs Assessment: post-procedure vital signs reviewed and stable Respiratory status: spontaneous breathing, nonlabored ventilation, respiratory function stable and patient connected to nasal cannula oxygen Cardiovascular status: blood pressure returned to baseline and stable Postop Assessment: no signs of nausea or vomiting Anesthetic complications: no       Last Vitals:  Vitals:   05/10/16 1047 05/10/16 1055  BP: (!) 128/94 123/90  Pulse: 99 90  Resp: 16 12  Temp:  36.5 C    Last Pain:  Vitals:   05/10/16 1055  TempSrc:   PainSc: Asleep                 [name],JAMES TERRILL

## 2016-05-11 ENCOUNTER — Encounter (HOSPITAL_COMMUNITY): Payer: Self-pay | Admitting: Orthopaedic Surgery

## 2016-05-11 LAB — BASIC METABOLIC PANEL
Anion gap: 5 (ref 5–15)
BUN: 13 mg/dL (ref 6–20)
CO2: 27 mmol/L (ref 22–32)
CREATININE: 0.91 mg/dL (ref 0.61–1.24)
Calcium: 8.7 mg/dL — ABNORMAL LOW (ref 8.9–10.3)
Chloride: 104 mmol/L (ref 101–111)
Glucose, Bld: 128 mg/dL — ABNORMAL HIGH (ref 65–99)
POTASSIUM: 4.1 mmol/L (ref 3.5–5.1)
SODIUM: 136 mmol/L (ref 135–145)

## 2016-05-11 LAB — CBC
HCT: 30.9 % — ABNORMAL LOW (ref 39.0–52.0)
Hemoglobin: 10.4 g/dL — ABNORMAL LOW (ref 13.0–17.0)
MCH: 30.3 pg (ref 26.0–34.0)
MCHC: 33.7 g/dL (ref 30.0–36.0)
MCV: 90.1 fL (ref 78.0–100.0)
PLATELETS: 225 10*3/uL (ref 150–400)
RBC: 3.43 MIL/uL — AB (ref 4.22–5.81)
RDW: 12.6 % (ref 11.5–15.5)
WBC: 11.2 10*3/uL — ABNORMAL HIGH (ref 4.0–10.5)

## 2016-05-11 MED ORDER — BISACODYL 5 MG PO TBEC
5.0000 mg | DELAYED_RELEASE_TABLET | Freq: Every day | ORAL | Status: DC | PRN
  Administered 2016-05-11: 5 mg via ORAL
  Filled 2016-05-11: qty 1

## 2016-05-11 NOTE — Progress Notes (Signed)
PATIENT ID: Daniel Booth        MRN:  [id number]          DOB/AGE: [birth year] / 61 y.o.    Joni Fears, MD   Biagio Borg, PA-C [address] [place], [place]  [address]                             [phone]   PROGRESS NOTE  Subjective:  negative for Chest Pain  negative for Shortness of Breath  negative for Nausea/Vomiting   negative for Calf Pain    Tolerating Diet: yes         Patient reports pain as mild.     Slept through the night. Sitting up eating breakfast without complaints  Objective: Vital signs in last 24 hours:   Patient Vitals for the past 24 hrs:  BP Temp Temp src Pulse Resp SpO2  05/11/16 0542 95/60 98.1 F (36.7 C) Oral 80 16 98 %  05/10/16 2346 108/69 98.2 F (36.8 C) Oral 81 15 95 %  05/10/16 1634 91/65 - - 85 - -  05/10/16 1630 (!) 87/59 - - 75 - -  05/10/16 1123 118/85 97.9 F (36.6 C) Oral 80 14 100 %  05/10/16 1055 123/90 97.7 F (36.5 C) - 90 12 95 %  05/10/16 1047 (!) 128/94 - - 99 16 100 %  05/10/16 1032 (!) 133/96 - - 82 12 100 %  05/10/16 1017 (!) 134/95 - - (!) 102 17 100 %  05/10/16 1002 (!) 130/100 - - 95 12 100 %  05/10/16 0947 (!) 132/95 97 F (36.1 C) - (!) 107 20 98 %      Intake/Output from previous day:   01/09 0701 - 01/10 0700 In: 2702.5 [P.O.:240; I.V.:2062.5] Out: 1300 [Urine:900]   Intake/Output this shift:   No intake/output data recorded.   Intake/Output      01/09 0701 - 01/10 0700 01/10 0701 - 01/11 0700   P.O. 240    I.V. (mL/kg) 2062.5 (23)    IV Piggyback 400    Total Intake(mL/kg) 2702.5 (30.1)    Urine (mL/kg/hr) 900 (0.4)    Blood 400 (0.2)    Total Output 1300     Net +1402.5             LABORATORY DATA:  Recent Labs  05/06/16 1320 05/11/16 0529  WBC 6.7 11.2*  HGB 15.1 10.4*  HCT 45.5 30.9*  PLT 279 225    Recent Labs  05/06/16 1320 05/11/16 0529  NA 140 136  K 3.7 4.1  CL 105 104  CO2 26 27  BUN 13 13  CREATININE 0.97 0.91  GLUCOSE 85 128*  CALCIUM 9.6 8.7*    Lab Results  Component Value Date   INR 0.96 05/06/2016    Recent Radiographic Studies :  Dg Chest 2 View  Result Date: 05/06/2016 CLINICAL DATA:  Arthroplasty. EXAM: CHEST  2 VIEW COMPARISON:  CT 12/19/2006. FINDINGS: Mediastinum hilar structures normal. Lungs are clear. No pleural effusion pneumothorax. Heart size normal. Degenerative changes noted of the thoracic spine. IMPRESSION: No acute cardiopulmonary disease. Electronically Signed   By: Marcello Moores  Register   On: 05/06/2016 13:53   Dg Hip Port Unilat With Pelvis 1v Left  Result Date: 05/10/2016 CLINICAL DATA:  Left hip arthroplasty. EXAM: DG HIP (WITH OR WITHOUT PELVIS) 1V PORT LEFT COMPARISON:  CT 06/03/2006. FINDINGS: Total left hip replacement. Hardware intact. Anatomic alignment.  No acute bony abnormality identified. IMPRESSION: Total left hip replacement.  Anatomic alignment. Electronically Signed   By: [place]   On: 05/10/2016 11:59     Examination:  General appearance: alert, cooperative and no distress  Wound Exam: clean, dry, intact   Drainage:  None: wound tissue dry  Motor Exam: EHL, FHL, Anterior Tibial and Posterior Tibial Intact  Sensory Exam: Superficial Peroneal, Deep Peroneal and Tibial normal  Vascular Exam: Normal  Assessment:    1 Day Post-Op  Procedure(s) (LRB): TOTAL HIP ARTHROPLASTY (Left)  ADDITIONAL DIAGNOSIS:  Principal Problem:   Primary osteoarthritis of left hip Active Problems:   Status post total replacement of left hip  Acute Blood Loss Anemia-asymptomatic   Plan: Physical Therapy as ordered Weight Bearing as Tolerated (WBAT)  DVT Prophylaxis:  Xarelto, Foot Pumps and TED hose  DISCHARGE PLAN: Home  DISCHARGE NEEDS: HHPT, Walker and 3-in-1 comode seat    OOB with PT, saline lock IV-consider D/C tomorrow if stable-labs fine    Garald Balding  05/11/2016 7:48 AM  Patient ID: Daniel Booth, male   DOB: [birth year], 61 y.o.   MRN: [id number]

## 2016-05-11 NOTE — Progress Notes (Signed)
Physical Therapy Treatment Patient Details Name: Daniel Booth MRN: [id number] DOB: 1956/01/01 Today's Date: 05/11/2016    History of Present Illness Admitted for LTHA, Posterior Prec, WBAT;  has a pertinent past medical history of Allergy; Arthritis; Complication of anesthesia (2011); and Prostate cancer ([place]).  has a past surgical history that includes Prostate surgery (2010); Shoulder arthroscopy; and Mohs surgery (Right).    PT Comments    Pt continues to make excellent progress with mobility even since AM session with this PT. Currently patient at overall supervision level approaching modified independent. Progressed gait this session without AD on unit >300'. Instructed pt and wife in LE HEP to address ROM, strength, and balance. Education and discussion in regards to bathroom access (recommending 3in1) and recommendation for hip kit. Progressed stair training as well during this session. Pt ready for d/c home from PT standpoint once medically cleared.    Follow Up Recommendations  Home health PT;Supervision - Intermittent     Equipment Recommendations  3in1 (PT)    Recommendations for Other Services       Precautions / Restrictions Precautions Precautions: Posterior Hip Precaution Comments: pt able to recall 3/3 hip precautions Required Braces or Orthoses: Knee Immobilizer - Left Knee Immobilizer - Left: Other (comment) (in bed only) Restrictions Weight Bearing Restrictions: No LLE Weight Bearing: Weight bearing as tolerated    Mobility  Bed Mobility   Bed Mobility: Supine to Sit     Supine to sit: Supervision     General bed mobility comments: pt used bed rail for support but able to manage LLE without assist to get to EOB. Educated on technique to maintain precautions  Transfers Overall transfer level: Needs assistance Equipment used: Rolling walker (2 wheeled) Transfers: Sit to/from Stand Sit to Stand: Modified independent (Device/Increase  time);Supervision         General transfer comment: progressing to modified independent by end of session.  Ambulation/Gait Ambulation/Gait assistance: Supervision;Min guard Ambulation Distance (Feet): 350 Feet Assistive device: None Gait Pattern/deviations: Antalgic;Decreased stance time - left     General Gait Details: Progressed gait during this session without AD. Initially very antalgic and min guard assist progressed to supervision wtih improved gait pattern and fluidity.    Stairs Stairs: Yes   Stair Management: Two rails;Step to pattern Number of Stairs: 5 General stair comments: to practice for flight of stairs to reach second floor. Pt demonstrating good carryover of sequencing and progressed to supervision level  Wheelchair Mobility    Modified Rankin (Stroke Patients Only)       Balance Overall balance assessment: Needs assistance   Sitting balance-Leahy Scale: Good       Standing balance-Leahy Scale: Good                      Cognition Arousal/Alertness: Awake/alert Behavior During Therapy: WFL for tasks assessed/performed Overall Cognitive Status: Within Functional Limits for tasks assessed                      Exercises Total Joint Exercises Ankle Circles/Pumps: AROM;Both;10 reps Quad Sets: Strengthening;5 reps;Both Hip ABduction/ADduction: Strengthening;AROM;Left;10 reps;Standing Knee Flexion: Standing;Left;10 reps;Strengthening;AROM Marching in Standing: Strengthening;AROM;Left;10 reps;Standing General Exercises - Lower Extremity Heel Raises: Strengthening;10 reps;Both;Standing Mini-Sqauts: Strengthening;Both;10 reps;Standing    General Comments  Education on home set-up, HEP, progression of mobility, car transfers, and safe use of AD.      Pertinent Vitals/Pain Pain Assessment: No/denies pain Faces Pain Scale: Hurts a little bit Pain Location: L  hip Pain Descriptors / Indicators: Sore Pain Intervention(s):  Premedicated before session    Home Living                      Prior Function            PT Goals (current goals can now be found in the care plan section) Acute Rehab PT Goals Patient Stated Goal: "go and do another lap!" PT Goal Formulation: With patient/family Time For Goal Achievement: 05/17/16 Potential to Achieve Goals: Good Progress towards PT goals: Progressing toward goals    Frequency    7X/week      PT Plan Current plan remains appropriate    Co-evaluation             End of Session Equipment Utilized During Treatment: Gait belt Activity Tolerance: Patient tolerated treatment well Patient left: in chair;with call bell/phone within reach;with family/visitor present     Time: 4967-5916 PT Time Calculation (min) (ACUTE ONLY): 37 min  Charges:  $Therapeutic Exercise: 8-22 mins $Therapeutic Activity: 8-22 mins                    G Codes:      Daniel Booth, PT, DPT Pager #: [phone]  05/11/2016, 11:11 AM

## 2016-05-11 NOTE — Op Note (Signed)
NAME:  Daniel Booth, Daniel Booth                     ACCOUNT NO.:  MEDICAL RECORD NO.:  [id number]  LOCATION:                                 FACILITY:  PHYSICIAN:  Vonna Kotyk. Durward Fortes, M.D.    DATE OF BIRTH:  DATE OF PROCEDURE:  05/10/2016 DATE OF DISCHARGE:                              OPERATIVE REPORT   PREOPERATIVE DIAGNOSIS:  End-stage osteoarthritis, left hip.  POSTOPERATIVE DIAGNOSIS:  End-stage osteoarthritis, left hip.  PROCEDURE:  Left total hip replacement.  SURGEON:  Vonna Kotyk. Durward Fortes, M.D.  ASSISTANT:  Aaron Edelman D. Petrarca, P.A.-C.  ANESTHESIA:  General.  COMPLICATIONS:  None.  COMPONENTS:  DePuy AML large 15 mm femoral component, 36 mm outer diameter hip ball with a 1.5 mm neck length, 54 mm outer diameter Gription 3 acetabular component with an apex hole eliminator, a Marathon polyethylene liner +4 with a 10-degree posterior lip, components were press-fit.  DESCRIPTION OF PROCEDURE:  Mr. Mcqueary was met with his wife in the holding area, identified the left hip as appropriate operative site and marked it accordingly.  The patient was then transported to room #7 and placed under general anesthesia without difficulty.  He was then placed in the lateral decubitus position with the left side up and secured to the operating room table with the Innomed hip system.  Time-out was called.  Left lower extremity was then prepped with chlorhexidine scrub and DuraPrep x2 from the iliac crest to the ankle.  Sterile draping was performed.  A second time-out was called.  A routine Southern incision was utilized and via sharp dissection, carried down through subcutaneous tissue.  Gross bleeders were Bovie coagulated.  Adipose tissue was incised to the level of muscle fascia. This was incised.  Muscle fibers were then manually separated.  There was minimal bleeding.  I found a nice rhaphe within the muscle fibers.  Self-retaining retractor was inserted.  Short external rotators  were identified and incised from their attachment to the posterior aspect of the greater trochanter.  Tendinous structures were tagged with 0 Ethibond suture.  The capsule was identified and incised along the femoral neck and head to the level of the acetabulum.  There was a small clear yellow joint effusion.  The head was then easily dislocated posteriorly.  I could palpate the sciatic nerve and felt it was well out of harm's way and protected throughout the case.  Approximately a fingerbreadth proximal to the lesser trochanter, the femoral neck was osteotomized with the oscillating saw and the head was delivered from the wound.  The majority of the head was devoid of articular cartilage and somewhat misshapen.  The piriformis fossa was identified.  A starter hole was made.  A canal finder was inserted, reaming was performed to 14.5 to accept a 15 mm component.  Rasping was performed sequentially to a large 15 mm which would fit the canal nicely proximally.  The calcar cutter was used to obtain the appropriate calcar angle.  The wound was irrigated with saline solution.  Retractors were placed about the acetabulum.  The labrum was sharply excised.  The capsule was retracted anteriorly and posteriorly.  Reaming was performed  sequentially to 53 mm to accept a 54 mm component.  I trialed a 52 and a 54 components, 52 would completely seat with good rim fit.  54 had good rim fit, but would not completely seat.  The final Gription 3 acetabular component was then impacted using the external acetabular guide.  I had a very nice fit and was perfectly stable.  The trial Marathon liner was inserted, followed by the large 15 mm rasp and a 36 mm outer diameter hip ball with a 1.5 mm neck length.  This entire construct was reduced.  There was no toggling, no instability with flexion, extension, internal or external rotation, thought leg lengths were symmetrical.  The trial components were  removed.  The joint was copiously irrigated with saline solution.  The apex-hole eliminator was inserted into the metallic acetabular component, followed by the final Marathon polyethylene liner +4 with a 10-degree lip.  The wound was again irrigated with saline solution.  The large femoral component, 15 mm was then impacted and flushed on the calcar in about 15 degrees of anteversion.  Morse taper neck was cleaned and the final metallic 1.5 mm neck length, 36 mm outer diameter hip ball was then impacted. Acetabulum was clear.  The entire construct was reduced.  Through a full range of motion, it remained perfectly stable with no toggling.  I thought the leg lengths were symmetrical.  Again checked, the sciatic nerve was intact.  Wound was irrigated with saline solution.  Tranexamic acid was applied topically.  The capsule was closed anatomically with a running 0 Ethibond.  Short external rotators were closed with similar material. The IT band was closed with a running #1 Vicryl.  Subcu in several layers with 2-0 Vicryl and 3-0 Monocryl.  Skin closed with skin clips. Sterile bulky dressing was applied.  The patient was then placed supine. A knee immobilizer placed in the left lower extremity.  The patient was awoken, placed on the operating stretcher, and returned to the postanesthesia recovery room in satisfactory condition.     Vonna Kotyk. Durward Fortes, M.D.     PWW/MEDQ  D:  05/10/2016  T:  05/11/2016  Job:  [id number]

## 2016-05-11 NOTE — Progress Notes (Signed)
Physical Therapy Treatment Patient Details Name: Daniel Booth MRN: [id number] DOB: Sep 11, 1955 Today's Date: 05/11/2016    History of Present Illness Admitted for LTHA, Posterior Prec, WBAT;  has a pertinent past medical history of Allergy; Arthritis; Complication of anesthesia (2011); and Prostate cancer ([place]).  has a past surgical history that includes Prostate surgery (2010); Shoulder arthroscopy; and Mohs surgery (Right).    PT Comments    Pt making excellent progress with mobility. Currently overall min guard to min assist with transfers, gait, and stairs using RW. Cues throughout and education on adaptations due to posterior hip precautions (car transfers, bed mobility, low furniture, etc). Pt receptive to all feedback and education.     Follow Up Recommendations  Home health PT;Supervision - Intermittent     Equipment Recommendations       Recommendations for Other Services       Precautions / Restrictions Precautions Precautions: Posterior Hip Precaution Comments: pt able to recall 3/3 hip precautions Required Braces or Orthoses: Knee Immobilizer - Left Knee Immobilizer - Left: Other (comment) (in bed only) Restrictions Weight Bearing Restrictions: No LLE Weight Bearing: Weight bearing as tolerated    Mobility  Bed Mobility   Bed Mobility: Supine to Sit     Supine to sit: Supervision     General bed mobility comments: pt used bed rail for support but able to manage LLE without assist to get to EOB. Educated on technique to maintain precautions  Transfers Overall transfer level: Needs assistance Equipment used: Rolling walker (2 wheeled) Transfers: Sit to/from Stand Sit to Stand: Min guard         General transfer comment: cues for LLE placement to maintain precautions  Ambulation/Gait Ambulation/Gait assistance: Supervision;Min guard Ambulation Distance (Feet): 150 Feet Assistive device: Rolling walker (2 wheeled) Gait Pattern/deviations: Step-to  pattern;Decreased stance time - left;Antalgic     General Gait Details: cues for gait pattern and pushing RW instead of lifting it for each step   Stairs Stairs: Yes   Stair Management: With walker Number of Stairs: 1 (x2 reps) General stair comments: practiced curb step negotiation for home entry practice with RW; cues for technique  Wheelchair Mobility    Modified Rankin (Stroke Patients Only)       Balance Overall balance assessment: Needs assistance   Sitting balance-Leahy Scale: Good       Standing balance-Leahy Scale: Good                      Cognition Arousal/Alertness: Awake/alert Behavior During Therapy: WFL for tasks assessed/performed Overall Cognitive Status: Within Functional Limits for tasks assessed                      Exercises      General Comments        Pertinent Vitals/Pain Faces Pain Scale: Hurts a little bit Pain Location: L hip Pain Descriptors / Indicators: Sore Pain Intervention(s): Monitored during session;Ice applied;Repositioned    Home Living                      Prior Function            PT Goals (current goals can now be found in the care plan section) Acute Rehab PT Goals Patient Stated Goal: get going! PT Goal Formulation: With patient Time For Goal Achievement: 05/17/16 Potential to Achieve Goals: Good Progress towards PT goals: Progressing toward goals    Frequency    7X/week  PT Plan Current plan remains appropriate    Co-evaluation             End of Session Equipment Utilized During Treatment: Gait belt Activity Tolerance: Patient tolerated treatment well Patient left: in chair;with call bell/phone within reach     Time: 0844-0902 PT Time Calculation (min) (ACUTE ONLY): 18 min  Charges:  $Gait Training: 8-22 mins                    G Codes:      Canary Brim Ivory Broad, PT, DPT Pager #: [phone]  05/11/2016, 9:09 AM

## 2016-05-12 LAB — CBC
HCT: 30.1 % — ABNORMAL LOW (ref 39.0–52.0)
Hemoglobin: 10 g/dL — ABNORMAL LOW (ref 13.0–17.0)
MCH: 29.9 pg (ref 26.0–34.0)
MCHC: 33.2 g/dL (ref 30.0–36.0)
MCV: 90.1 fL (ref 78.0–100.0)
PLATELETS: 222 10*3/uL (ref 150–400)
RBC: 3.34 MIL/uL — AB (ref 4.22–5.81)
RDW: 12.5 % (ref 11.5–15.5)
WBC: 8.3 10*3/uL (ref 4.0–10.5)

## 2016-05-12 LAB — BASIC METABOLIC PANEL
Anion gap: 6 (ref 5–15)
BUN: 10 mg/dL (ref 6–20)
CALCIUM: 8.6 mg/dL — AB (ref 8.9–10.3)
CO2: 29 mmol/L (ref 22–32)
CREATININE: 0.91 mg/dL (ref 0.61–1.24)
Chloride: 103 mmol/L (ref 101–111)
GFR calc Af Amer: >60 mL/min (ref >60)
Glucose, Bld: 101 mg/dL — ABNORMAL HIGH (ref 65–99)
POTASSIUM: 4 mmol/L (ref 3.5–5.1)
SODIUM: 138 mmol/L (ref 135–145)

## 2016-05-12 MED ORDER — RIVAROXABAN 10 MG PO TABS: 10.0000 mg | ORAL_TABLET | Freq: Every day | ORAL | 0 refills | Status: DC

## 2016-05-12 MED ORDER — ACETAMINOPHEN 500 MG PO TABS: 500.0000 mg | ORAL_TABLET | Freq: Four times a day (QID) | ORAL | 0 refills | Status: DC | PRN

## 2016-05-12 MED ORDER — OXYCODONE HCL 5 MG PO TABS
5.0000 mg | ORAL_TABLET | ORAL | 0 refills | Status: DC | PRN
Start: 2016-05-12 — End: 2016-10-11

## 2016-05-12 MED ORDER — METHOCARBAMOL 500 MG PO TABS: 500.0000 mg | ORAL_TABLET | Freq: Four times a day (QID) | ORAL | 0 refills | Status: DC | PRN

## 2016-05-12 NOTE — Progress Notes (Signed)
Physical Therapy Treatment Patient Details Name: Daniel Booth MRN: [id number] DOB: [birth year] Today's Date: 05/12/2016    History of Present Illness Admitted for LTHA, Posterior Prec, WBAT;  has a pertinent past medical history of Allergy; Arthritis; Complication of anesthesia (2011); and Prostate cancer ([place]).  has a past surgical history that includes Prostate surgery (2010); Shoulder arthroscopy; and Mohs surgery (Right).    PT Comments    Pt is POD 2 and is moving slower with therapy. Pt has increased c/o pain in left hip with gait and is unable to ambulate without an AD and takes increased time to perform gait this session. Advised pt to use RW for the first week in order to reduce strain on left hip. Pt has increased muscle soreness following surgery and advised him this will improved with time.    Follow Up Recommendations  Home health PT;Supervision - Intermittent     Equipment Recommendations  3in1 (PT)    Recommendations for Other Services       Precautions / Restrictions Precautions Precautions: Posterior Hip Precaution Booklet Issued: Yes (comment) Precaution Comments: pt able to recall 3/3 hip precautions Required Braces or Orthoses: Knee Immobilizer - Left Knee Immobilizer - Left: Other (comment) (in bed only) Restrictions Weight Bearing Restrictions: Yes LLE Weight Bearing: Weight bearing as tolerated    Mobility  Bed Mobility               General bed mobility comments: Pt OOB in recliner when PT enters room  Transfers Overall transfer level: Needs assistance Equipment used: Rolling walker (2 wheeled) Transfers: Sit to/from Stand Sit to Stand: Modified independent (Device/Increase time);Supervision         General transfer comment: Mod I from recliner  Ambulation/Gait Ambulation/Gait assistance: Supervision;Min guard Ambulation Distance (Feet): 500 Feet Assistive device: Rolling walker (2 wheeled) Gait Pattern/deviations:  Antalgic;Decreased stance time - left Gait velocity: decreased Gait velocity interpretation: Below normal speed for age/gender General Gait Details: Increased antalgic gait this session with increased c/o pain during gait in left buttock.    Stairs            Wheelchair Mobility    Modified Rankin (Stroke Patients Only)       Balance                                    Cognition Arousal/Alertness: Awake/alert Behavior During Therapy: WFL for tasks assessed/performed Overall Cognitive Status: Within Functional Limits for tasks assessed                      Exercises Total Joint Exercises Ankle Circles/Pumps: AROM;Both;10 reps Quad Sets: AROM;Left;10 reps;Supine Long Arc Quad: AROM;Left;10 reps;Seated    General Comments        Pertinent Vitals/Pain Pain Assessment: 0-10 Pain Score: 6  Pain Location: L hip Pain Descriptors / Indicators: Sore Pain Intervention(s): Monitored during session;Premedicated before session;Ice applied    Home Living                      Prior Function            PT Goals (current goals can now be found in the care plan section) Acute Rehab PT Goals Patient Stated Goal: to get back to his walks and get back on the tennis court Progress towards PT goals: Progressing toward goals    Frequency    7X/week  PT Plan Current plan remains appropriate    Co-evaluation             End of Session           Time: 1040-1107 PT Time Calculation (min) (ACUTE ONLY): 27 min  Charges:  $Gait Training: 8-22 mins $Therapeutic Exercise: 8-22 mins                    G Codes:      Scheryl Marten PT, DPT  [phone]  05/12/2016, 1:14 PM

## 2016-05-12 NOTE — Discharge Summary (Signed)
Daniel Booth, Daniel Booth   Daniel Borg, PA-C [address], [place], [place]  [address]                             [phone]  PATIENT ID: Daniel Booth        MRN:  [id number]          DOB/AGE: 61-May-1957 / 61 y.o.    DISCHARGE SUMMARY  ADMISSION DATE:    05/10/2016 DISCHARGE DATE:   05/12/2016   ADMISSION DIAGNOSIS: LEFT HIP OSTEOARTHRITIS    DISCHARGE DIAGNOSIS:  LEFT HIP OSTEOARTHRITIS    ADDITIONAL DIAGNOSIS: Principal Problem:   Primary osteoarthritis of left hip Active Problems:   Status post total replacement of left hip  Past Medical History:  Diagnosis Date  . Allergy   . Arthritis   . Complication of anesthesia 2011   "vitals dropped with endoscopy"  . GERD (gastroesophageal reflux disease)    on omeprazole  . History of Barrett's esophagus   . History of kidney stones    10-15 yrs ago  . Hyperlipidemia   . Prostate cancer [facility])     PROCEDURE: Procedure(s): TOTAL HIP ARTHROPLASTY Left on 05/10/2016  CONSULTS: none    HISTORY: [name] is a very pleasant who is seen today for evaluation of his left hip. Street dates back to late 2008 when he started developing left hip pain and pelvic pain. Any history of injury or trauma at that time but with the sudden onset was a constant and mild type pain but worsened with activities. Some inserts for shoes at that time due to foot pain. MRI scan was ordered which revealed mild to moderate osteoarthritis of the hips bilaterally. He was only symptomatic on his left at that time. They used over-the-counter ibuprofen 800 mg 3 times a day with food.   He returned in February 2012 again having symptoms. He did have some changes worsening bulge in the left hip. Sent off to Dr. Ernestina Patches for evaluation. He was performed which apparently was beneficial until 2016. He also had a repeat injection in April 2016. He had done been doing well overall still having some symptoms but turned back in July of this year. He does play tennis  1-3 times week and also golfs occasionally. Has gotten to the point now where his pain was worsening. X-rays at that time revealed periarticular spurring around the femoral neck and he lacked joint space medially in the acetabular area. He also had loss of the joint space superiorly and medially. Repeat injection on 11/30/2015 was performed. The injection was on beneficial for about 6 weeks. He returns today for reevaluation and discussion of further treatment plans.  HOSPITAL COURSE:  JAMESPATRICK Booth is a 61 y.o. admitted on 05/10/2016 and found to have a diagnosis of LEFT HIP OSTEOARTHRITIS.  After appropriate laboratory studies were obtained  they were taken to the operating room on 05/10/2016 and underwent  Procedure(s): TOTAL HIP ARTHROPLASTY  Left.   They were given perioperative antibiotics:  Anti-infectives    Start     Dose/Rate Route Frequency Ordered Stop   05/10/16 1300  ceFAZolin (ANCEF) IVPB 1 g/50 mL premix     1 g 100 mL/hr over 30 Minutes Intravenous Every 6 hours 05/10/16 1126 05/10/16 1843   05/10/16 0548  ceFAZolin (ANCEF) IVPB 2g/100 mL premix     2 g 200 mL/hr over 30 Minutes Intravenous On call to O.R. 05/10/16 [id number]  05/10/16 0726    .  Tolerated the procedure well.  Toradol was given post op.  POD #1, allowed out of bed to a chair.  PT for ambulation and exercise program.    IV saline locked.  O2 discontionued.  POD #2, continued PT and ambulation.  . The remainder of the hospital course was dedicated to ambulation and strengthening.   The patient was discharged on 2 Days Post-Op in  Stable condition.  Blood products given:none  DIAGNOSTIC STUDIES: Recent vital signs: Patient Vitals for the past 24 hrs:  BP Temp Temp src Pulse Resp SpO2  05/12/16 0602 110/64 98 F (36.7 C) Oral 74 16 98 %  05/11/16 2100 106/76 98.4 F (36.9 C) Oral 79 16 98 %  05/11/16 1401 108/69 98.4 F (36.9 C) - 77 16 98 %       Recent laboratory studies:  Recent Labs  05/06/16 1320  05/11/16 0529 05/12/16 0532  WBC 6.7 11.2* 8.3  HGB 15.1 10.4* 10.0*  HCT 45.5 30.9* 30.1*  PLT 279 225 222    Recent Labs  05/06/16 1320 05/11/16 0529 05/12/16 0532  NA 140 136 138  K 3.7 4.1 4.0  CL 105 104 103  CO2 26 27 29   BUN 13 13 10   CREATININE 0.97 0.91 0.91  GLUCOSE 85 128* 101*  CALCIUM 9.6 8.7* 8.6*   Lab Results  Component Value Date   INR 0.96 05/06/2016     Recent Radiographic Studies :  Dg Chest 2 View  Result Date: 05/06/2016 CLINICAL DATA:  Arthroplasty. EXAM: CHEST  2 VIEW COMPARISON:  CT 12/19/2006. FINDINGS: Mediastinum hilar structures normal. Lungs are clear. No pleural effusion pneumothorax. Heart size normal. Degenerative changes noted of the thoracic spine. IMPRESSION: No acute cardiopulmonary disease. Electronically Signed   By: Marcello Moores  Register   On: 05/06/2016 13:53   Dg Hip Port Unilat With Pelvis 1v Left  Result Date: 05/10/2016 CLINICAL DATA:  Left hip arthroplasty. EXAM: DG HIP (WITH OR WITHOUT PELVIS) 1V PORT LEFT COMPARISON:  CT 06/03/2006. FINDINGS: Total left hip replacement. Hardware intact. Anatomic alignment. No acute bony abnormality identified. IMPRESSION: Total left hip replacement.  Anatomic alignment. Electronically Signed   By: Marcello Moores  Register   On: 05/10/2016 11:59    DISCHARGE INSTRUCTIONS: Discharge Instructions    Call Daniel Booth / Call 911    Complete by:  As directed    If you experience chest pain or shortness of breath, CALL 911 and be transported to the hospital emergency room.  If you develope a fever above 101 F, pus (white drainage) or increased drainage or redness at the wound, or calf pain, call your surgeon's office.   Change dressing    Complete by:  As directed    DO NOT CHANGE THE DRESSING   Constipation Prevention    Complete by:  As directed    Drink plenty of fluids.  Prune juice may be helpful.  You may use a stool softener, such as Colace (over the counter) 100 mg twice a day.  Use MiraLax (over the counter)  for constipation as needed.   Diet general    Complete by:  As directed    Discharge instructions    Complete by:  As directed    [place] items at home which could result in a fall. This includes throw rugs or furniture in walking pathways ICE to the affected joint every three hours while awake for 30 minutes at a  time, for at least the first 3-5 days, and then as needed for pain and swelling.  Continue to use ice for pain and swelling. You may notice swelling that will progress down to the foot and ankle.  This is normal after surgery.  Elevate your leg when you are not up walking on it.   Continue to use the breathing machine you got in the hospital (incentive spirometer) which will help keep your temperature down.  It is common for your temperature to cycle up and down following surgery, especially at night when you are not up moving around and exerting yourself.  The breathing machine keeps your lungs expanded and your temperature down.   DIET:  As you were doing prior to hospitalization, we recommend a well-balanced diet.  DRESSING / WOUND CARE / SHOWERING  Keep the surgical dressing until follow up.  The dressing is water proof, so you can shower without any extra covering.  IF THE DRESSING FALLS OFF or the wound gets wet inside, change the dressing with sterile gauze.  Please use good hand washing techniques before changing the dressing.  Do not use any lotions or creams on the incision until instructed by your surgeon.    ACTIVITY  Increase activity slowly as tolerated, but follow the weight bearing instructions below.   No driving for 6 weeks or until further direction given by your physician.  You cannot drive while taking narcotics.  No lifting or carrying greater than 10 lbs. until further directed by your surgeon. Avoid periods of inactivity such as sitting longer than an hour when not asleep. This helps prevent blood clots.  You may return  to work once you are authorized by your doctor.     WEIGHT BEARING   Weight bearing as tolerated with assist device (walker, cane, etc) as directed, use it as long as suggested by your surgeon or therapist, typically at least 4-6 weeks.   EXERCISES  Results after joint replacement surgery are often greatly improved when you follow the exercise, range of motion and muscle strengthening exercises prescribed by your doctor. Safety measures are also important to protect the joint from further injury. Any time any of these exercises cause you to have increased pain or swelling, decrease what you are doing until you are comfortable again and then slowly increase them. If you have problems or questions, call your caregiver or physical therapist for advice.   Rehabilitation is important following a joint replacement. After just a few days of immobilization, the muscles of the leg can become weakened and shrink (atrophy).  These exercises are designed to build up the tone and strength of the thigh and leg muscles and to improve motion. Often times heat used for twenty to thirty minutes before working out will loosen up your tissues and help with improving the range of motion but do not use heat for the first two weeks following surgery (sometimes heat can increase post-operative swelling).   These exercises can be done on a training (exercise) mat, on a table or on a bed. Use whatever works the best and is most comfortable for you.    Use music or television while you are exercising so that the exercises are a pleasant break in your day. This will make your life better with the exercises acting as a break in your routine that you can look forward to.   Perform all exercises about fifteen times, three times per day or as directed.  You should exercise both  the operative leg and the other leg as well.   Exercises include:  Quad Sets - Tighten up the muscle on the front of the thigh (Quad) and hold for 5-10  seconds.   Straight Leg Raises - With your knee straight (if you were given a brace, keep it on), lift the leg to 60 degrees, hold for 3 seconds, and slowly lower the leg.  Perform this exercise against resistance later as your leg gets stronger.  Leg Slides: Lying on your back, slowly slide your foot toward your buttocks, bending your knee up off the floor (only go as far as is comfortable). Then slowly slide your foot back down until your leg is flat on the floor again.  Angel Wings: Lying on your back spread your legs to the side as far apart as you can without causing discomfort.  Hamstring Strength:  Lying on your back, push your heel against the floor with your leg straight by tightening up the muscles of your buttocks.  Repeat, but this time bend your knee to a comfortable angle, and push your heel against the floor.  You may put a pillow under the heel to make it more comfortable if necessary.   A rehabilitation program following joint replacement surgery can speed recovery and prevent re-injury in the future due to weakened muscles. Contact your doctor or a physical therapist for more information on knee rehabilitation.    CONSTIPATION  Constipation is defined medically as fewer than three stools per week and severe constipation as less than one stool per week.  Even if you have a regular bowel pattern at home, your normal regimen is likely to be disrupted due to multiple reasons following surgery.  Combination of anesthesia, postoperative narcotics, change in appetite and fluid intake all can affect your bowels.   YOU MUST use at least one of the following options; they are listed in order of increasing strength to get the job done.  They are all available over the counter, and you may need to use some, POSSIBLY even all of these options:    Drink plenty of fluids (prune juice may be helpful) and high fiber foods Colace 100 mg by mouth twice a day  Senokot for constipation as directed and  as needed Dulcolax (bisacodyl), take with full glass of water  Miralax (polyethylene glycol) once or twice a day as needed.  If you have tried all these things and are unable to have a bowel movement in the first 3-4 days after surgery call either your surgeon or your primary doctor.    If you experience loose stools or diarrhea, hold the medications until you stool forms back up.  If your symptoms do not get better within 1 week or if they get worse, check with your doctor.  If you experience "the worst abdominal pain ever" or develop nausea or vomiting, please contact the office immediately for further recommendations for treatment.   ITCHING:  If you experience itching with your medications, try taking only a single pain pill, or even half a pain pill at a time.  You can also use Benadryl over the counter for itching or also to help with sleep.   TED HOSE STOCKINGS:  Use stockings on both legs until for at least 2 weeks or as directed by physician office. They may be removed at night for sleeping.  MEDICATIONS:  See your medication summary on the "After Visit Summary" that nursing will review with you.  You may have  some home medications which will be placed on hold until you complete the course of blood thinner medication.  It is important for you to complete the blood thinner medication as prescribed.  PRECAUTIONS:  If you experience chest pain or shortness of breath - call 911 immediately for transfer to the hospital emergency department.   If you develop a fever greater that 101 F, purulent drainage from wound, increased redness or drainage from wound, foul odor from the wound/dressing, or calf pain - CONTACT YOUR SURGEON.                                                   FOLLOW-UP APPOINTMENTS:  If you do not already have a post-op appointment, please call the office for an appointment to be seen by your surgeon.  Guidelines for how soon to be seen are listed in your "After Visit Summary",  but are typically between 1-4 weeks after surgery.  OTHER INSTRUCTIONS:   Knee Replacement:  Do not place pillow under knee, focus on keeping the knee straight while resting. CPM instructions: 0-90 degrees, 2 hours in the morning, 2 hours in the afternoon, and 2 hours in the evening. Place foam block, curve side up under heel at all times except when in CPM or when walking.  DO NOT modify, tear, cut, or change the foam block in any way.  MAKE SURE YOU:  Understand these instructions.  Get help right away if you are not doing well or get worse.    Thank you for letting us be a part of your medical care team.  It is a privilege we respect greatly.  We hope these instructions will help you stay on track for a fast and full recovery!   Driving restrictions    Complete by:  As directed    No driving for 6 weeks   Follow the hip precautions as taught in Physical Therapy    Complete by:  As directed    Increase activity slowly as tolerated    Complete by:  As directed    Lifting restrictions    Complete by:  As directed    No lifting for 6 weeks   Patient may shower    Complete by:  As directed    You may shower over the brown dressing   TED hose    Complete by:  As directed    Use stockings (TED hose) for 3 weeks on left leg.  You may remove them at night for sleeping.   Weight bearing as tolerated    Complete by:  As directed    Laterality:  left   Extremity:  Lower      DISCHARGE MEDICATIONS:   Allergies as of 05/12/2016      Reactions   No Known Allergies       Medication List    STOP taking these medications   aspirin 81 MG tablet   co-enzyme Q-10 30 MG capsule   fish oil-omega-3 fatty acids 1000 MG capsule   ibuprofen 200 MG tablet Commonly known as:  ADVIL,MOTRIN   multivitamin with minerals tablet   naproxen sodium 220 MG tablet Commonly known as:  ANAPROX   predniSONE 10 MG (21) Tbpk tablet Commonly known as:  STERAPRED UNI-PAK 21 TAB   traMADol 50 MG  tablet Commonly known as:  Veatrice Bourbon  TAKE these medications   acetaminophen 500 MG tablet Commonly known as:  TYLENOL Take 1-2 tablets (500-1,000 mg total) by mouth every 6 (six) hours as needed.   CIALIS 5 MG tablet Generic drug:  tadalafil TAKE 1 TABLET DAILY AS DIRECTED. What changed:  See the new instructions.   fluticasone 50 MCG/ACT nasal spray Commonly known as:  FLONASE USE AS DIRECTED What changed:  how much to take  how to take this  when to take this  reasons to take this  additional instructions   loratadine 10 MG tablet Commonly known as:  CLARITIN Take 10 mg by mouth daily.   methocarbamol 500 MG tablet Commonly known as:  ROBAXIN Take 1 tablet (500 mg total) by mouth every 6 (six) hours as needed for muscle spasms. What changed:  when to take this  reasons to take this   omeprazole 20 MG capsule Commonly known as:  PRILOSEC TAKE 1 CAPSULE (20 MG TOTAL) BY MOUTH DAILY.   oxyCODONE 5 MG immediate release tablet Commonly known as:  Oxy IR/ROXICODONE Take 1-2 tablets (5-10 mg total) by mouth every 4 (four) hours as needed for breakthrough pain.   rivaroxaban 10 MG Tabs tablet Commonly known as:  XARELTO Take 1 tablet (10 mg total) by mouth daily with breakfast. Start taking on:  05/13/2016   simvastatin 20 MG tablet Commonly known as:  ZOCOR Take 1 tablet (20 mg total) by mouth at bedtime.            Durable Medical Equipment        Start     Ordered   05/10/16 1127  DME Walker rolling  Once    Question:  Patient needs a walker to treat with the following condition  Answer:  Status post total hip replacement, left   05/10/16 1126   05/10/16 1127  DME 3 n 1  Once     05/10/16 1126   05/10/16 1127  DME Bedside commode  Once    Question:  Patient needs a bedside commode to treat with the following condition  Answer:  Status post total replacement of left hip   05/10/16 1126      FOLLOW UP VISIT:   Follow-up Information    Garald Balding, Daniel Booth Follow up on 05/23/2016.   Specialty:  Orthopedic Surgery Contact information: [address] [place] [address] [phone]           DISPOSITION:   Home  CONDITION:  Stable   Mike Craze. [place], [place] [phone]  05/12/2016 9:32 AM

## 2016-05-12 NOTE — Op Note (Signed)
PATIENT ID: Daniel Booth        MRN:  [id number]          DOB/AGE: 1955/12/12 / 61 y.o.    Joni Fears, MD   Biagio Borg, PA-C [address] [place], [place]  [address]                             [phone]   PROGRESS NOTE  Subjective:  negative for Chest Pain  negative for Shortness of Breath  negative for Nausea/Vomiting   negative for Calf Pain    Tolerating Diet: yes         Patient reports pain as mild.     Anxious for discharge-no related problems except early constipation  Objective: Vital signs in last 24 hours:   Patient Vitals for the past 24 hrs:  BP Temp Temp src Pulse Resp SpO2  05/12/16 0602 110/64 98 F (36.7 C) Oral 74 16 98 %  05/11/16 2100 106/76 98.4 F (36.9 C) Oral 79 16 98 %  05/11/16 1401 108/69 98.4 F (36.9 C) - 77 16 98 %      Intake/Output from previous day:   01/10 0701 - 01/11 0700 In: 1200 [P.O.:1200] Out: 700 [Urine:700]   Intake/Output this shift:   No intake/output data recorded.   Intake/Output      01/10 0701 - 01/11 0700 01/11 0701 - 01/12 0700   P.O. 1200    I.V. (mL/kg)     IV Piggyback     Total Intake(mL/kg) 1200 (13.4)    Urine (mL/kg/hr) 700 (0.3)    Blood     Total Output 700     Net +500          Urine Occurrence 3 x       LABORATORY DATA:  Recent Labs  05/06/16 1320 05/11/16 0529 05/12/16 0532  WBC 6.7 11.2* 8.3  HGB 15.1 10.4* 10.0*  HCT 45.5 30.9* 30.1*  PLT 279 225 222    Recent Labs  05/06/16 1320 05/11/16 0529 05/12/16 0532  NA 140 136 138  K 3.7 4.1 4.0  CL 105 104 103  CO2 26 27 29   BUN 13 13 10   CREATININE 0.97 0.91 0.91  GLUCOSE 85 128* 101*  CALCIUM 9.6 8.7* 8.6*   Lab Results  Component Value Date   INR 0.96 05/06/2016    Recent Radiographic Studies :  Dg Chest 2 View  Result Date: 05/06/2016 CLINICAL DATA:  Arthroplasty. EXAM: CHEST  2 VIEW COMPARISON:  CT 12/19/2006. FINDINGS: Mediastinum hilar structures normal. Lungs are clear. No pleural effusion  pneumothorax. Heart size normal. Degenerative changes noted of the thoracic spine. IMPRESSION: No acute cardiopulmonary disease. Electronically Signed   By: Marcello Moores  Register   On: 05/06/2016 13:53   Dg Hip Port Unilat With Pelvis 1v Left  Result Date: 05/10/2016 CLINICAL DATA:  Left hip arthroplasty. EXAM: DG HIP (WITH OR WITHOUT PELVIS) 1V PORT LEFT COMPARISON:  CT 06/03/2006. FINDINGS: Total left hip replacement. Hardware intact. Anatomic alignment. No acute bony abnormality identified. IMPRESSION: Total left hip replacement.  Anatomic alignment. Electronically Signed   By: Marcello Moores  Register   On: 05/10/2016 11:59     Examination:  General appearance: alert, cooperative and no distress  Wound Exam: clean, dry, intact   Drainage:  None: wound tissue dry  Motor Exam: EHL, FHL, Anterior Tibial and Posterior Tibial Intact  Sensory Exam: Superficial Peroneal, Deep Peroneal and Tibial normal  Vascular Exam: Normal  Assessment:    2 Days Post-Op  Procedure(s) (LRB): TOTAL HIP ARTHROPLASTY (Left)  ADDITIONAL DIAGNOSIS:  Principal Problem:   Primary osteoarthritis of left hip Active Problems:   Status post total replacement of left hip  Acute Blood Loss Anemia-stable,asymptomatic   Plan: Physical Therapy as ordered Weight Bearing as Tolerated (WBAT)  DVT Prophylaxis:  Xarelto and TED hose  DISCHARGE PLAN: Home  DISCHARGE NEEDS: HHPT, Walker and 3-in-1 comode seat    Excellent progress in PT , voiding without problems, wound clean and dry-will D/C today    Garald Balding  05/12/2016 9:39 AM

## 2016-05-12 NOTE — Care Management Note (Signed)
Case Management Note  Patient Details  Name: Daniel Booth MRN: [id number] Date of Birth: 23-Feb-1956  Subjective/Objective:    61 yr old gentleman s/p left total hip arthroplasty.                  Action/Plan: Case manager spoke with patient and his wife concerning home health and DME needs. Patient was preoperatively setup with [facility], no changes. His wife has brought in his rolling walker, 3in1 has been ordered. Patient will have family support at discharge.                 Expected Discharge Date:    05/12/16              Expected Discharge Plan:  [place]  In-House Referral:  NA  Discharge planning Services  CM Consult  Post Acute Care Choice:  Durable Medical Equipment, Home Health Choice offered to:  Patient  DME Arranged:  3-N-1 DME Agency:  [place]:  PT [place]:  [facility] (formerly [facility])  Status of Service:  Completed, signed off  If discussed at [employer] of Stay Meetings, dates discussed:    Additional Comments:  Ninfa Meeker, RN 05/12/2016, 11:02 AM

## 2016-05-12 NOTE — Discharge Instructions (Signed)
Information on my medicine - XARELTO (Rivaroxaban)  This medication education was reviewed with me or my healthcare representative as part of my discharge preparation.  The pharmacist that spoke with me during my hospital stay was:  Eudelia Bunch, [facility]  Why was Xarelto prescribed for you? Xarelto was prescribed for you to reduce the risk of blood clots forming after orthopedic surgery. The medical term for these abnormal blood clots is venous thromboembolism (VTE).  What do you need to know about xarelto ? Take your Xarelto ONCE DAILY at the same time every day. You may take it either with or without food.  If you have difficulty swallowing the tablet whole, you may crush it and mix in applesauce just prior to taking your dose.  Take Xarelto exactly as prescribed by your doctor and DO NOT stop taking Xarelto without talking to the doctor who prescribed the medication.  Stopping without other VTE prevention medication to take the place of Xarelto may increase your risk of developing a clot.  After discharge, you should have regular check-up appointments with your healthcare provider that is prescribing your Xarelto.    What do you do if you miss a dose? If you miss a dose, take it as soon as you remember on the same day then continue your regularly scheduled once daily regimen the next day. Do not take two doses of Xarelto on the same day.   Important Safety Information A possible side effect of Xarelto is bleeding. You should call your healthcare provider right away if you experience any of the following: ? Bleeding from an injury or your nose that does not stop. ? Unusual colored urine (red or dark brown) or unusual colored stools (red or black). ? Unusual bruising for unknown reasons. ? A serious fall or if you hit your head (even if there is no bleeding).  Some medicines may interact with Xarelto and might increase your risk of bleeding while on Xarelto. To help avoid  this, consult your healthcare provider or pharmacist prior to using any new prescription or non-prescription medications, including herbals, vitamins, non-steroidal anti-inflammatory drugs (NSAIDs) and supplements.  This website has more information on Xarelto: [email or web address]-benson.[email or web address]  Information on my medicine - XARELTO (Rivaroxaban)  This medication education was reviewed with me or my healthcare representative as part of my discharge preparation.  The pharmacist that spoke with me during my hospital stay was:  Eudelia Bunch, [facility]  Why was Xarelto prescribed for you? Xarelto was prescribed for you to reduce the risk of blood clots forming after orthopedic surgery. The medical term for these abnormal blood clots is venous thromboembolism (VTE).  What do you need to know about xarelto ? Take your Xarelto ONCE DAILY at the same time every day. You may take it either with or without food.  If you have difficulty swallowing the tablet whole, you may crush it and mix in applesauce just prior to taking your dose.  Take Xarelto exactly as prescribed by your doctor and DO NOT stop taking Xarelto without talking to the doctor who prescribed the medication.  Stopping without other VTE prevention medication to take the place of Xarelto may increase your risk of developing a clot.  After discharge, you should have regular check-up appointments with your healthcare provider that is prescribing your Xarelto.    What do you do if you miss a dose? If you miss a dose, take it as soon as you remember on the same day then continue your  regularly scheduled once daily regimen the next day. Do not take two doses of Xarelto on the same day.   Important Safety Information A possible side effect of Xarelto is bleeding. You should call your healthcare provider right away if you experience any of the following: ? Bleeding from an injury or your nose that does not stop. ? Unusual colored urine (red or dark  brown) or unusual colored stools (red or black). ? Unusual bruising for unknown reasons. ? A serious fall or if you hit your head (even if there is no bleeding).  Some medicines may interact with Xarelto and might increase your risk of bleeding while on Xarelto. To help avoid this, consult your healthcare provider or pharmacist prior to using any new prescription or non-prescription medications, including herbals, vitamins, non-steroidal anti-inflammatory drugs (NSAIDs) and supplements.  This website has more information on Xarelto: [email or web address]://guerra-benson.com/.   Total Hip Replacement Total hip replacement is a surgical procedure to remove damaged bone in your hip joint and replace it with an artificial hip joint (prosthetic hip joint). The purpose of this surgery is to reduce pain and to improve your hip function. During a total hip replacement, one or both parts of the hip joint are replaced, depending on the type of joint damage you have. The hip is a ball-and-socket type of joint, and it has two main parts. The ball part of the joint (femoral head) is the top of the thigh bone (femur). The socket part of the joint is a large indent in the side of your pelvis (acetabulum) where the femur and pelvis meet. Tell a health care provider about:  Any allergies you have.  All medicines you are taking, including vitamins, herbs, eye drops, creams, and over-the-counter medicines.  Any problems you or family members have had with anesthetic medicines.  Any blood disorders you have.  Any surgeries you have had.  Any medical conditions you have. What are the risks? Generally, total hip replacement is a safe procedure. However, problems can occur, including:  Infection.  Dislocation (the ball of the hip-joint prosthesis comes out of contact with the socket).  Loosening of the piece (stem) that connects the prosthetic femoral head to the femur.  Fracture of the bone while inserting the  prosthesis.  Formation of blood clots, which can break loose and travel to and injure your lungs (pulmonary embolus). What happens before the procedure?  Plan to have someone take you home after the procedure.  Do not eat or drink anything after midnight on the night before the procedure or as directed by your health care provider.  Ask your health care provider about:  Changing or stopping your regular medicines. This is especially important if you are taking diabetes medicines or blood thinners.  Taking medicines such as aspirin and ibuprofen. These medicines can thin your blood. Do not take these medicines before your procedure if your health care provider asks you not to.  Ask your health care provider about how your surgical site will be marked or identified.  You may be given antibiotic medicines to help prevent infection. What happens during the procedure?  To reduce your risk of infection:  Your health care team will wash or sanitize their hands.  Your skin will be washed with soap.  An IV tube will be inserted into one of your veins. You will be given one or more of the following:  A medicine that makes you drowsy (sedative).  A medicine that makes you fall asleep (  general anesthetic).  A medicine injected into your spine that numbs your body below the waist (spinal anesthetic).  An incision will be made in your hip. Your surgeon will take out any damaged cartilage and bone.  Your surgeon will then:  Insert a prosthetic socket into the acetabulum of your pelvis. This is usually secured with screws.  Remove the femoral head and replace it with a prosthetic ball and stem secured into the top of your femur.  Place the ball into the socket and check the range of motion and stability of your new hip.  Close the incision and apply a bandage over the surgical site. What happens after the procedure?  You will stay in a recovery area until the medicines have worn  off.  Your vital signs, such as your pulse and blood pressure, will be monitored.  Once you are awake and stable, you will be taken to a hospital room.  You may be directed to take actions to help prevent blood clots. These may include:  Walking soon after surgery, with someone assisting you. Moving around after surgery helps to improve blood flow.  Taking medicines to thin your blood (anticoagulants).  Wearing compression stockings or using different types of devices.  You will receive physical therapy until you are doing well and your health care provider feels it is safe for you to go home. This information is not intended to replace advice given to you by your health care provider. Make sure you discuss any questions you have with your health care provider. Document Released: 07/25/2000 Document Revised: 12/21/2015 Document Reviewed: 06/19/2013 Elsevier Interactive Patient Education  2017 [employer].

## 2016-05-12 NOTE — Evaluation (Signed)
Occupational Therapy Evaluation and Discharge Patient Details Name: Daniel Booth MRN: [id number] DOB: October 14, 1955 Today's Date: 05/12/2016    History of Present Illness Admitted for LTHA, Posterior Prec, WBAT;  has a pertinent past medical history of Allergy; Arthritis; Complication of anesthesia (2011); and Prostate cancer ([place]).  has a past surgical history that includes Prostate surgery (2010); Shoulder arthroscopy; and Mohs surgery (Right).   Clinical Impression   PTA Pt independent in ADL and mobility. Pt currently supervision for ADL using AE hip kit for assistance. Pt demonstrated competency with all AE in hip kit (maintaining posterior precautions) and had no questions or concerns about shower transfer or toilet transfer (discussed at length with OT). Pt fully assessed and received all education. No further OT needs at this time. OT to sign off. Thank you for this referral.     Follow Up Recommendations  No OT follow up;Supervision - Intermittent    Equipment Recommendations  3 in 1 bedside commode;Other (comment) (Pt has purchased AE hip kit)    Recommendations for Other Services       Precautions / Restrictions Precautions Precautions: Posterior Hip Precaution Booklet Issued: Yes (comment) Precaution Comments: pt able to recall 3/3 hip precautions Required Braces or Orthoses: Knee Immobilizer - Left Knee Immobilizer - Left: Other (comment) (in bed only) Restrictions Weight Bearing Restrictions: Yes LLE Weight Bearing: Weight bearing as tolerated      Mobility Bed Mobility               General bed mobility comments: Pt sitting OOB in recliner when OT entered the room. Pt stated that his bed at home is higher and so OT suggested using a step stool and having wife to hold RW in front of him and going up like PT teaches stair training.   Transfers                 General transfer comment: Pt declined transfer at this time, Pt operating at supervision  level with PT yesterday and Pt verbally able to explain safe hand placement and technique to maintain hip precautions    Balance Overall balance assessment: Needs assistance Sitting-balance support: No upper extremity supported;Feet supported Sitting balance-Leahy Scale: Good Sitting balance - Comments: sitting edge of recliner, no back support able to perform LB ADL                                    ADL Overall ADL's : Needs assistance/impaired Eating/Feeding: Modified independent;Sitting Eating/Feeding Details (indicate cue type and reason): finishing up breakfast when OT entered the room Grooming: Supervision/safety;Standing   Upper Body Bathing: Supervision/ safety;Standing   Lower Body Bathing: Supervison/ safety;With caregiver independent assisting (standing) Lower Body Bathing Details (indicate cue type and reason): educated Pt on use of long handle sponge for bathing and maintaining precautions. Pt verbalized and demonstrated understanding. Upper Body Dressing : Sitting;Set up   Lower Body Dressing: Modified independent;With adaptive equipment;Adhering to hip precautions;Sit to/from stand Lower Body Dressing Details (indicate cue type and reason): Pt educated, practiced using AE hip kit for all lower body dressing. Demonstrated proficiency with grabber/reacher, sock donner, and long handle shoe horn while maintaining precautions Toilet Transfer: Min Agricultural engineer Details (indicate cue type and reason): OT demonstrated for Pt, Pt has been using the comfort height toilet in the bathroom with grab bars per Pt report     Tub/ Shower Transfer: Walk-in shower;Supervision/safety;With  caregiver independent assisting;Ambulation;Rolling walker Tub/Shower Transfer Details (indicate cue type and reason): Pt educated on safe sequence of entry. Demonstration  Functional mobility during ADLs:  (Pt declined) General ADL Comments: Pt able to demonstrate  competency with AE kit to maintain precautions     Vision Vision Assessment?: No apparent visual deficits   Perception     Praxis      Pertinent Vitals/Pain Pain Assessment: 0-10 Pain Score: 4  Faces Pain Scale: Hurts little more Pain Location: L hip Pain Descriptors / Indicators: Sore Pain Intervention(s): Monitored during session;Limited activity within patient's tolerance;Ice applied     Hand Dominance Right   Extremity/Trunk Assessment Upper Extremity Assessment Upper Extremity Assessment: Overall WFL for tasks assessed   Lower Extremity Assessment Lower Extremity Assessment: LLE deficits/detail LLE Deficits / Details: Grossly decr AROM and strength L hip, limited by pain postop   Cervical / Trunk Assessment Cervical / Trunk Assessment: Normal   Communication Communication Communication: No difficulties   Cognition Arousal/Alertness: Awake/alert Behavior During Therapy: WFL for tasks assessed/performed Overall Cognitive Status: Within Functional Limits for tasks assessed                     General Comments       Exercises       Shoulder Instructions      Home Living Family/patient expects to be discharged to:: Private residence Living Arrangements: Spouse/significant other Available Help at Discharge: Family;Available 24 hours/day Type of Home: House Home Access: Stairs to enter [employer] of Steps: 1   Home Layout: Two level;Able to live on main level with bedroom/bathroom     Bathroom Shower/Tub: Occupational psychologist: Standard Bathroom Accessibility: Yes How Accessible: Accessible via walker Home Equipment: [place] - 2 wheels;Shower seat - built in;Hand held shower head          Prior Functioning/Environment Level of Independence: Independent                 OT Problem List: Decreased knowledge of use of DME or AE;Decreased activity tolerance;Impaired balance (sitting and/or standing)   OT  Treatment/Interventions:      OT Goals(Current goals can be found in the care plan section) Acute Rehab OT Goals Patient Stated Goal: to get back to his walks and get back on the tennis court OT Goal Formulation: With patient Time For Goal Achievement: 05/19/16 Potential to Achieve Goals: Good  OT Frequency:     Barriers to D/C:            Co-evaluation              End of Session Equipment Utilized During Treatment: Other (comment) (AE hip kit) Nurse Communication: Mobility status;Precautions  Activity Tolerance: Patient tolerated treatment well Patient left: in chair;with call bell/phone within reach   Time: 0817-0850 OT Time Calculation (min): 33 min Charges:  OT General Charges $OT Visit: 1 Procedure OT Evaluation $OT Eval Moderate Complexity: 1 Procedure OT Treatments $Self Care/Home Management : 8-22 mins G-Codes:    Merri Ray Leith Hedlund 05-14-2016, 9:29 AM Hulda Humphrey OTR/L [phone]

## 2016-05-13 ENCOUNTER — Telehealth (INDEPENDENT_AMBULATORY_CARE_PROVIDER_SITE_OTHER): Payer: Self-pay | Admitting: Orthopaedic Surgery

## 2016-05-13 NOTE — Telephone Encounter (Signed)
Patient calling on status of FMLA/STD forms that were dropped off last week. Patient has paid form fees. Patient wanted these faxed in. Please call patient back at [phone]

## 2016-05-16 ENCOUNTER — Telehealth (INDEPENDENT_AMBULATORY_CARE_PROVIDER_SITE_OTHER): Payer: Self-pay | Admitting: Orthopaedic Surgery

## 2016-05-16 NOTE — Telephone Encounter (Signed)
Spoke with PW and Left VM for [facility] to go with what was stated below.

## 2016-05-16 NOTE — Telephone Encounter (Signed)
Sharyn Lull with [facility] called and stated she started therapy with patient last week and is requesting verbal orders for: 1 week 1 and 2 week 2. Please call her back [phone]

## 2016-05-16 NOTE — Telephone Encounter (Signed)
Faxed Friday pm and called pt

## 2016-05-19 ENCOUNTER — Inpatient Hospital Stay (INDEPENDENT_AMBULATORY_CARE_PROVIDER_SITE_OTHER): Payer: 59 | Admitting: Orthopaedic Surgery

## 2016-05-23 ENCOUNTER — Inpatient Hospital Stay (INDEPENDENT_AMBULATORY_CARE_PROVIDER_SITE_OTHER): Payer: 59 | Admitting: Orthopaedic Surgery

## 2016-05-24 ENCOUNTER — Encounter (INDEPENDENT_AMBULATORY_CARE_PROVIDER_SITE_OTHER): Payer: Self-pay | Admitting: Orthopaedic Surgery

## 2016-05-24 ENCOUNTER — Ambulatory Visit (INDEPENDENT_AMBULATORY_CARE_PROVIDER_SITE_OTHER): Payer: 59

## 2016-05-24 ENCOUNTER — Ambulatory Visit (INDEPENDENT_AMBULATORY_CARE_PROVIDER_SITE_OTHER): Payer: 59 | Admitting: Orthopaedic Surgery

## 2016-05-24 VITALS — Ht 70.0 in | Wt 195.0 lb

## 2016-05-24 DIAGNOSIS — Z96642 Presence of left artificial hip joint: Secondary | ICD-10-CM

## 2016-05-24 NOTE — Progress Notes (Signed)
Office Visit Note   Patient: Daniel Booth           Date of Birth: [birth year]           MRN: [id number] Visit Date: 05/24/2016              Requested by: Dorena Cookey, MD [place], [place] [address] PCP: Daniel Man, MD   Assessment & Plan: Visit Diagnoses: Daniel Booth is 2 weeks status post left total hip replacement and doing well. He is independent with a walker and receiving home health physical therapy  Plan: 2 weeks, multiple questions answered regarding medicines and physical therapy as well as return to work Follow-Up Instructions: No Follow-up on file.   Orders:  No orders of the defined types were placed in this encounter.  No orders of the defined types were placed in this encounter.     Procedures: No procedures performed   Clinical Data: No additional findings.   Subjective: No chief complaint on file.   05/10/16 2 weeks post op of Left THA. Pt presents ambulating with a walker, wants to use a cane.   Pt is very happy with his [facility] and is doiing very well. Doing "laps" in his house every hour.    Review of Systems   Objective: Vital Signs: There were no vitals taken for this visit.  Physical Exam  Ortho Exam left hip incision is healing nicely without problem. The clips are removed and Steri-Strips applied. Neurovascular exam is intact. He might be an eighth of an inch or last longer on the left than he is on the right  Specialty Comments:  No specialty comments available.  Imaging: No results found.   PMFS History: Patient Active Problem List   Diagnosis Date Noted  . Primary osteoarthritis of left hip 05/10/2016  . Status post total replacement of left hip 05/10/2016  . Headache(784.0) 05/27/2013  . Prostate cancer ([place]) 06/29/2010  . Acute esophagitis 08/15/2007  . MIXED HYPERLIPIDEMIA 03/02/2007  . Allergic rhinitis 11/10/2006  . DEGENERATIVE DISC DISEASE, CERVICAL SPINE 11/10/2006  .  DEGENERATIVE DISC DISEASE, LUMBAR SPINE 11/10/2006   Past Medical History:  Diagnosis Date  . Allergy   . Arthritis   . Complication of anesthesia 2011   "vitals dropped with endoscopy"  . GERD (gastroesophageal reflux disease)    on omeprazole  . History of Barrett's esophagus   . History of kidney stones    10-15 yrs ago  . Hyperlipidemia   . Prostate cancer [facility])     Family History  Problem Relation Age of Onset  . Colon polyps Father   . Diabetes Father   . Colon cancer Neg Hx   . Esophageal cancer Neg Hx   . Rectal cancer Neg Hx   . Stomach cancer Neg Hx     Past Surgical History:  Procedure Laterality Date  . COLONOSCOPY    . MOHS SURGERY Right    scalp  . PROSTATE SURGERY  2010  . SHOULDER ARTHROSCOPY  2007   right  . TOTAL HIP ARTHROPLASTY Left 05/10/2016   Procedure: TOTAL HIP ARTHROPLASTY;  Surgeon: Garald Balding, MD;  Location: [facility];  Service: Orthopedics;  Laterality: Left;  . UPPER GASTROINTESTINAL ENDOSCOPY     Social History   Occupational History  . Not on file.   Social History Main Topics  . Smoking status: Former Smoker    Packs/day: 0.25    Types: Cigarettes  Quit date: 04/27/1982  . Smokeless tobacco: Never Used  . Alcohol use 1.8 oz/week    3 Glasses of wine per week  . Drug use: No  . Sexual activity: Not on file

## 2016-05-25 ENCOUNTER — Other Ambulatory Visit (INDEPENDENT_AMBULATORY_CARE_PROVIDER_SITE_OTHER): Payer: Self-pay | Admitting: Orthopedic Surgery

## 2016-05-25 ENCOUNTER — Telehealth (INDEPENDENT_AMBULATORY_CARE_PROVIDER_SITE_OTHER): Payer: Self-pay | Admitting: Orthopaedic Surgery

## 2016-05-25 MED ORDER — METHOCARBAMOL 500 MG PO TABS: 500.0000 mg | ORAL_TABLET | Freq: Four times a day (QID) | ORAL | 0 refills | Status: DC | PRN

## 2016-05-25 MED ORDER — METHOCARBAMOL 500 MG PO TABS: 500.0000 mg | ORAL_TABLET | Freq: Three times a day (TID) | ORAL | 0 refills | Status: DC | PRN

## 2016-05-25 NOTE — Telephone Encounter (Signed)
Please advise 

## 2016-05-25 NOTE — Telephone Encounter (Signed)
escribed

## 2016-05-25 NOTE — Telephone Encounter (Signed)
Patient needs a refill of the methocarbamal sent to [facility] on [address].

## 2016-05-26 ENCOUNTER — Telehealth (INDEPENDENT_AMBULATORY_CARE_PROVIDER_SITE_OTHER): Payer: Self-pay | Admitting: *Deleted

## 2016-05-26 NOTE — Telephone Encounter (Signed)
A physical therapy from [facility] called this afternoon in regards to needing orders for Physical therapy for 4 visits twice a week for two weeks. Michelle's CB # [phone]. Thank you

## 2016-05-27 NOTE — Telephone Encounter (Signed)
OK 

## 2016-05-27 NOTE — Telephone Encounter (Signed)
Called [place] and [facility]

## 2016-06-08 ENCOUNTER — Other Ambulatory Visit (INDEPENDENT_AMBULATORY_CARE_PROVIDER_SITE_OTHER): Payer: Self-pay | Admitting: Orthopedic Surgery

## 2016-06-10 ENCOUNTER — Encounter (INDEPENDENT_AMBULATORY_CARE_PROVIDER_SITE_OTHER): Payer: Self-pay | Admitting: Orthopaedic Surgery

## 2016-06-10 ENCOUNTER — Ambulatory Visit (INDEPENDENT_AMBULATORY_CARE_PROVIDER_SITE_OTHER): Payer: 59 | Admitting: Orthopaedic Surgery

## 2016-06-10 VITALS — BP 109/81 | HR 99 | Ht 70.0 in | Wt 195.0 lb

## 2016-06-10 DIAGNOSIS — Z96642 Presence of left artificial hip joint: Secondary | ICD-10-CM

## 2016-06-10 NOTE — Progress Notes (Signed)
Office Visit Note   Patient: Daniel Booth           Date of Birth: 1955/07/06           MRN: [id number] Visit Date: 06/10/2016              Requested by: Dorena Cookey, MD [place], [place] [address] PCP: Joycelyn Man, MD   Assessment & Plan: Visit Diagnoses: 1 month status post left total hip replacement-doing well  Plan: Continue with cane as needed, continue with home exercise program. Gradual increase with activity as tolerated with work note reflecting the above. Discontinues xarelto.  Follow-Up Instructions: No Follow-up on file.   Orders:  No orders of the defined types were placed in this encounter.  No orders of the defined types were placed in this encounter.     Procedures: No procedures performed   Clinical Data: No additional findings.   Subjective: Chief Complaint  Patient presents with  . Left Hip - Routine Post Op    TOTAL HIP ARTHROPLASTY 05/10/16    Patient is 61 y.o male presents today for follow up left total hip arthroplasty. He has completed in home physical therapy. He is concerned about blood pressure with a recent reading of 98/70. He does not take medication for blood pressure management and states he has no prior medical problems with this. He only is taking simvastatin for his cholesterol. He would like to know when he can restart vitamin supplements. He has almost completed xarleto. He has dental appointment, he questions if he needs antibiotic for prophyalxis. He is currently taking colace. He wants to know limitations, possible travel limitations. He does have pain on left knee cap. He has been doing lunges doing physical therapy. He has discontinued pain medication, he takes tylenol only 6x's day. He does complain of stiffness and decrease of endurance.   Has not taken the muscle relaxer pain medicine in over a week.  Review of Systems   Objective: Vital Signs: BP 109/81 (BP Location: Right Arm, Patient  Position: Sitting)   Pulse 99   Ht 5\' 10"  (1.778 m)   Wt 195 lb (88.5 kg)   BMI 27.98 kg/m   Physical Exam  Ortho Exam left hip incision healing without problem. Leg lengths appeared to be symmetrical. Neurovascular exam intact. No swelling. Atrophy of left thigh compared to right which was identified preoperatively. His range of motion of hip.  Specialty Comments:  No specialty comments available.  Imaging: No results found.   PMFS History: Patient Active Problem List   Diagnosis Date Noted  . Primary osteoarthritis of left hip 05/10/2016  . Status post total replacement of left hip 05/10/2016  . Headache(784.0) 05/27/2013  . Prostate cancer ([place]) 06/29/2010  . Acute esophagitis 08/15/2007  . MIXED HYPERLIPIDEMIA 03/02/2007  . Allergic rhinitis 11/10/2006  . DEGENERATIVE DISC DISEASE, CERVICAL SPINE 11/10/2006  . DEGENERATIVE DISC DISEASE, LUMBAR SPINE 11/10/2006   Past Medical History:  Diagnosis Date  . Allergy   . Arthritis   . Complication of anesthesia 2011   "vitals dropped with endoscopy"  . GERD (gastroesophageal reflux disease)    on omeprazole  . History of Barrett's esophagus   . History of kidney stones    10-15 yrs ago  . Hyperlipidemia   . Prostate cancer [facility])     Family History  Problem Relation Age of Onset  . Colon polyps Father   . Diabetes Father   . Colon cancer  Neg Hx   . Esophageal cancer Neg Hx   . Rectal cancer Neg Hx   . Stomach cancer Neg Hx     Past Surgical History:  Procedure Laterality Date  . COLONOSCOPY    . MOHS SURGERY Right    scalp  . PROSTATE SURGERY  2010  . SHOULDER ARTHROSCOPY  2007   right  . TOTAL HIP ARTHROPLASTY Left 05/10/2016   Procedure: TOTAL HIP ARTHROPLASTY;  Surgeon: Garald Balding, MD;  Location: [facility];  Service: Orthopedics;  Laterality: Left;  . UPPER GASTROINTESTINAL ENDOSCOPY     Social History   Occupational History  . Not on file.   Social History Main Topics  . Smoking status: Former  Smoker    Packs/day: 0.25    Types: Cigarettes    Quit date: 04/27/1982  . Smokeless tobacco: Never Used  . Alcohol use 1.8 oz/week    3 Glasses of wine per week  . Drug use: No  . Sexual activity: Not on file

## 2016-06-18 ENCOUNTER — Other Ambulatory Visit: Payer: Self-pay | Admitting: Family Medicine

## 2016-07-11 ENCOUNTER — Ambulatory Visit (INDEPENDENT_AMBULATORY_CARE_PROVIDER_SITE_OTHER): Payer: 59 | Admitting: Orthopaedic Surgery

## 2016-07-11 ENCOUNTER — Encounter (INDEPENDENT_AMBULATORY_CARE_PROVIDER_SITE_OTHER): Payer: Self-pay | Admitting: Orthopaedic Surgery

## 2016-07-11 VITALS — BP 125/90 | HR 79 | Resp 14 | Ht 70.0 in | Wt 185.0 lb

## 2016-07-11 DIAGNOSIS — M25552 Pain in left hip: Secondary | ICD-10-CM

## 2016-07-11 DIAGNOSIS — Z96642 Presence of left artificial hip joint: Secondary | ICD-10-CM

## 2016-07-11 NOTE — Progress Notes (Signed)
Office Visit Note   Patient: Daniel Booth           Date of Birth: [birth year]           MRN: [id number] Visit Date: 07/11/2016              Requested by: Dorena Cookey, MD [place], [place] [address] PCP: Joycelyn Man, MD   Assessment & Plan: Visit Diagnoses:  1. Pain of left hip joint   2 months status post primary left total hip replacement and doing well.  Plan: Office 3 months, continue exercises, progressive increase his activity as outlined  Follow-Up Instructions: No Follow-up on file.   Orders:  No orders of the defined types were placed in this encounter.  No orders of the defined types were placed in this encounter.     Procedures: No procedures performed   Clinical Data: No additional findings.   Subjective: No chief complaint on file.   Pt Patient is 61 y.o male presents today for status post 9 weeks  left total hip arthroplasty. Pt has taken several road trips and relates he had no discomfort and stretched along the way. He only takes 1 Aleve a day. He has not played tennis or golf but is driving.  Feels like his leg lengths are symmetrical. Denies fever or chills. Walks without ambulatory aid and not taking any medicines.  Review of Systems   Objective: Vital Signs: There were no vitals taken for this visit.  Physical Exam  Ortho Exam painless range of motion of both hips. Leg lengths appear to be symmetrical. Neurovascular exam intact. No issues with left hip incision. No thigh pain.  Specialty Comments:  No specialty comments available.  Imaging: No results found.   PMFS History: Patient Active Problem List   Diagnosis Date Noted  . Primary osteoarthritis of left hip 05/10/2016  . Status post total replacement of left hip 05/10/2016  . Headache(784.0) 05/27/2013  . Prostate cancer ([place]) 06/29/2010  . Acute esophagitis 08/15/2007  . MIXED HYPERLIPIDEMIA 03/02/2007  . Allergic rhinitis 11/10/2006  .  DEGENERATIVE DISC DISEASE, CERVICAL SPINE 11/10/2006  . DEGENERATIVE DISC DISEASE, LUMBAR SPINE 11/10/2006   Past Medical History:  Diagnosis Date  . Allergy   . Arthritis   . Complication of anesthesia 2011   "vitals dropped with endoscopy"  . GERD (gastroesophageal reflux disease)    on omeprazole  . History of Barrett's esophagus   . History of kidney stones    10-15 yrs ago  . Hyperlipidemia   . Prostate cancer [facility])     Family History  Problem Relation Age of Onset  . Colon polyps Father   . Diabetes Father   . Colon cancer Neg Hx   . Esophageal cancer Neg Hx   . Rectal cancer Neg Hx   . Stomach cancer Neg Hx     Past Surgical History:  Procedure Laterality Date  . COLONOSCOPY    . MOHS SURGERY Right    scalp  . PROSTATE SURGERY  2010  . SHOULDER ARTHROSCOPY  2007   right  . TOTAL HIP ARTHROPLASTY Left 05/10/2016   Procedure: TOTAL HIP ARTHROPLASTY;  Surgeon: Garald Balding, MD;  Location: [place];  Service: Orthopedics;  Laterality: Left;  . UPPER GASTROINTESTINAL ENDOSCOPY     Social History   Occupational History  . Not on file.   Social History Main Topics  . Smoking status: Former Smoker    Packs/day: 0.25  Types: Cigarettes    Quit date: 04/27/1982  . Smokeless tobacco: Never Used  . Alcohol use 1.8 oz/week    3 Glasses of wine per week  . Drug use: No  . Sexual activity: Not on file

## 2016-07-29 ENCOUNTER — Other Ambulatory Visit: Payer: Self-pay | Admitting: Family Medicine

## 2016-08-01 ENCOUNTER — Telehealth: Payer: Self-pay | Admitting: Family Medicine

## 2016-08-01 NOTE — Telephone Encounter (Signed)
Sent to the pharmacy by e-scribe for 90 days.  Message sent to scheduling.

## 2016-08-01 NOTE — Telephone Encounter (Signed)
I have refilled patient's omeprazole for 90 days.  Please help him to establish with another provider.  He is past due for a follow up/yearly.  Thanks!!

## 2016-08-02 NOTE — Telephone Encounter (Signed)
Pt has been sch

## 2016-08-12 ENCOUNTER — Other Ambulatory Visit: Payer: Self-pay | Admitting: Physician Assistant

## 2016-09-30 NOTE — Addendum Note (Signed)
Addendum  created 09/30/16 1829 by Rica Koyanagi, MD   Sign clinical note

## 2016-10-07 ENCOUNTER — Telehealth (INDEPENDENT_AMBULATORY_CARE_PROVIDER_SITE_OTHER): Payer: Self-pay | Admitting: Orthopaedic Surgery

## 2016-10-07 ENCOUNTER — Other Ambulatory Visit (INDEPENDENT_AMBULATORY_CARE_PROVIDER_SITE_OTHER): Payer: Self-pay

## 2016-10-07 MED ORDER — METHOCARBAMOL 500 MG PO TABS: 500.0000 mg | ORAL_TABLET | ORAL | 0 refills | Status: DC | PRN

## 2016-10-07 NOTE — Telephone Encounter (Signed)
Robaxin 500mg  #20 1 tab po tid prn-please call and relate RX will be faxed to his pharmacy

## 2016-10-07 NOTE — Telephone Encounter (Signed)
Patient left a message yesterday evening stating that he is in quiet a bit of pain on his left side of his back.  He has an appointment on Monday, June 11, but wants to know if Dr. Durward Fortes will call in some muscle relaxers to [facility], [place].  The address is [address], [place].  Patient's CB#[phone].  Thank you.

## 2016-10-07 NOTE — Telephone Encounter (Signed)
Please advise 

## 2016-10-07 NOTE — Telephone Encounter (Signed)
SENT FAX OF RX AND CALLED PT

## 2016-10-10 ENCOUNTER — Ambulatory Visit (INDEPENDENT_AMBULATORY_CARE_PROVIDER_SITE_OTHER): Payer: 59 | Admitting: Orthopaedic Surgery

## 2016-10-10 ENCOUNTER — Encounter (INDEPENDENT_AMBULATORY_CARE_PROVIDER_SITE_OTHER): Payer: Self-pay | Admitting: Orthopaedic Surgery

## 2016-10-10 ENCOUNTER — Ambulatory Visit (INDEPENDENT_AMBULATORY_CARE_PROVIDER_SITE_OTHER): Payer: 59

## 2016-10-10 ENCOUNTER — Other Ambulatory Visit (INDEPENDENT_AMBULATORY_CARE_PROVIDER_SITE_OTHER): Payer: Self-pay

## 2016-10-10 VITALS — BP 131/88 | HR 80 | Ht 70.0 in | Wt 180.0 lb

## 2016-10-10 DIAGNOSIS — M5442 Lumbago with sciatica, left side: Secondary | ICD-10-CM | POA: Diagnosis not present

## 2016-10-10 DIAGNOSIS — Z96642 Presence of left artificial hip joint: Secondary | ICD-10-CM

## 2016-10-10 DIAGNOSIS — M5440 Lumbago with sciatica, unspecified side: Secondary | ICD-10-CM

## 2016-10-10 NOTE — Progress Notes (Signed)
Office Visit Note   Patient: Daniel Booth           Date of Birth: 1955-08-02           MRN: [id number] Visit Date: 10/10/2016              Requested by: Dorena Cookey, MD [place],  [id number] PCP: Dorena Cookey, MD   Assessment & Plan: Visit Diagnoses:  1. Acute bilateral low back pain with left-sided sciatica   2. Status post left hip replacement   Acute onset of low back pain after playing golf week ago. Pain is better but still functionally limiting. Had some referred pain to the left thigh. 5 months status post left total hip replacement without any problems  Plan: Robaxin as muscle relaxant, physical therapy for low back exercises. Left hip is stable we'll plan to see back in 3 months or sooner if he continues to have a problem with his back and consider an MRI scan  Follow-Up Instructions: Return in about 3 months (around 01/10/2017).   Orders:  Orders Placed This Encounter  Procedures  . XR Lumbar Spine 2-3 Views   No orders of the defined types were placed in this encounter.     Procedures: No procedures performed   Clinical Data: No additional findings.   Subjective: Chief Complaint  Patient presents with  . Left Hip - Routine Post Op    [name] is status post 4 months LEFT THA. No issues with the hip. HE relates he played golf last week and his left sided low pain has pain. He quit Aleve, diverticulitis 3 weeks ago in hospital.   . Lower Back - Pain    L sided LBP started when he Stopped the Aleve due to diverticulitis and in hospital In [place] 3 weeks ago.He think the Aleve contributed to this new condition.   Daniel Booth is 5 months status post primary left total hip replacement from that standpoint doing very well. While visiting in [place] he developed diverticulitis and was hospitalized with antibiotics for 2 days. He has a follow-up appointment with his gastroenterologist here in [facility] tomorrow. He's very happy with this  hip replacement. After playing golf last week and he developed some left-sided low back pain with occasional this comfort in his left thigh. The thigh pain has resolved but still having some back pain. Denies any change in bowel or bladder function. No pain distal to the knee. Has had problems with his back in the past  HPI  Review of Systems   Objective: Vital Signs: BP 131/88   Pulse 80   Ht 5\' 10"  (1.778 m)   Wt 180 lb (81.6 kg)   BMI 25.83 kg/m   Physical Exam  Ortho Exam pain is range of motion left hip with internal/external rotation. Straight leg raise negative. Neurovascular exam intact. Mild left sided disc sacral discomfort to percussion. No masses. Little "stiff" with ambulation with back pain. No related hip discomfort  Specialty Comments:  No specialty comments available.  Imaging: Xr Lumbar Spine 2-3 Views  Result Date: 10/10/2016 Films of the lumbar spine obtained in AP and lateral projection. Straightening of the normal lordotic curve. Mild degenerative changes at L4-5 and L5-S1. no listhesis. Possible pars defect L5-S1 SI joints appear clear    PMFS History: Patient Active Problem List   Diagnosis Date Noted  . Primary osteoarthritis of left hip 05/10/2016  . Status post total replacement of left hip  05/10/2016  . Headache(784.0) 05/27/2013  . Prostate cancer ([place]) 06/29/2010  . Acute esophagitis 08/15/2007  . MIXED HYPERLIPIDEMIA 03/02/2007  . Allergic rhinitis 11/10/2006  . DEGENERATIVE DISC DISEASE, CERVICAL SPINE 11/10/2006  . DEGENERATIVE DISC DISEASE, LUMBAR SPINE 11/10/2006   Past Medical History:  Diagnosis Date  . Allergy   . Arthritis   . Complication of anesthesia 2011   "vitals dropped with endoscopy"  . GERD (gastroesophageal reflux disease)    on omeprazole  . History of Barrett's esophagus   . History of kidney stones    10-15 yrs ago  . Hyperlipidemia   . Prostate cancer [facility])     Family History  Problem Relation Age of Onset    . Colon polyps Father   . Diabetes Father   . Colon cancer Neg Hx   . Esophageal cancer Neg Hx   . Rectal cancer Neg Hx   . Stomach cancer Neg Hx     Past Surgical History:  Procedure Laterality Date  . COLONOSCOPY    . MOHS SURGERY Right    scalp  . PROSTATE SURGERY  2010  . SHOULDER ARTHROSCOPY  2007   right  . TOTAL HIP ARTHROPLASTY Left 05/10/2016   Procedure: TOTAL HIP ARTHROPLASTY;  Surgeon: Garald Balding, MD;  Location: [place];  Service: Orthopedics;  Laterality: Left;  . UPPER GASTROINTESTINAL ENDOSCOPY     Social History   Occupational History  . Not on file.   Social History Main Topics  . Smoking status: Former Smoker    Packs/day: 0.25    Types: Cigarettes    Quit date: 04/27/1982  . Smokeless tobacco: Never Used  . Alcohol use 1.8 oz/week    3 Glasses of wine per week  . Drug use: No  . Sexual activity: Not on file     Garald Balding, MD   Note - This record has been created using [employer].  Chart creation errors have been sought, but may not always  have been located. Such creation errors do not reflect on  the standard of medical care.

## 2016-10-11 ENCOUNTER — Ambulatory Visit (INDEPENDENT_AMBULATORY_CARE_PROVIDER_SITE_OTHER): Payer: 59 | Admitting: Gastroenterology

## 2016-10-11 ENCOUNTER — Encounter: Payer: Self-pay | Admitting: Gastroenterology

## 2016-10-11 VITALS — BP 116/80 | HR 80 | Ht 69.25 in | Wt 199.0 lb

## 2016-10-11 DIAGNOSIS — K5732 Diverticulitis of large intestine without perforation or abscess without bleeding: Secondary | ICD-10-CM | POA: Diagnosis not present

## 2016-10-11 DIAGNOSIS — M545 Low back pain, unspecified: Secondary | ICD-10-CM

## 2016-10-11 MED ORDER — NA SULFATE-K SULFATE-MG SULF 17.5-3.13-1.6 GM/177ML PO SOLN: 1.0000 | Freq: Once | ORAL | 0 refills | Status: AC

## 2016-10-11 NOTE — Patient Instructions (Addendum)
If you are age 61 or older, your body mass index should be between 23-30. Your Body mass index is 29.18 kg/m. If this is out of the aforementioned range listed, please consider follow up with your Primary Care Provider.  If you are age 86 or younger, your body mass index should be between 19-25. Your Body mass index is 29.18 kg/m. If this is out of the aformentioned range listed, please consider follow up with your Primary Care Provider.   We have sent the following medications to your pharmacy for you to pick up at your convenience:  [facility] have been scheduled for a colonoscopy. Please follow written instructions given to you at your visit today.  Please pick up your prep supplies at the pharmacy within the next 1-3 days. If you use inhalers (even only as needed), please bring them with you on the day of your procedure. Your physician has requested that you go to www.startemmi.com and enter the access code given to you at your visit today. This web site gives a general overview about your procedure. However, you should still follow specific instructions given to you by our office regarding your preparation for the procedure.  Please purchase the fiber supplement Citrucel and take daily.  Thank you.

## 2016-10-11 NOTE — Progress Notes (Signed)
HPI :  61 y/o male here for follow up evaluation for diverticulitis. He was traveling in [place] with family, states he developed severe LLQ pain and subjective fevers. He presented to the ER their and was admitted for diverticulitis, noted on CT scan, suspected sigmoid diverticulitis. WBC was 15s. Treated with flagyl and levaquin. Was in the hospital about 2-3 days. First time episode of this. He denied any blood in the stools. He reports after a few weeks the pain eventually resolved, mostly feeling back to normal at this time.   His last colonoscopy was April 2010 at which time he had mild sigmoid diverticulosis.  He reported fairly frequent NSAID use, asks about long term management of back pain. He takes omeprazole daily for GERD which controls symptoms well.   CT scan 09/11/2016 - extensive sigmoid diverticulosis, with inflammatory changes suspicious for sigmoid diverticulitis  Recent endoscopic history: EGD 03/09/2015 - normal GEJ, no Barrett's, benign gastric polyp Colonoscopy 08/19/2008 - mild sigmoid diverticulosis   Past Medical History:  Diagnosis Date  . Allergy   . Arthritis   . Complication of anesthesia 2011   "vitals dropped with endoscopy"  . Diverticulitis   . Diverticulosis   . GERD (gastroesophageal reflux disease)    on omeprazole  . History of Barrett's esophagus   . History of kidney stones    10-15 yrs ago  . Hyperlipidemia   . Prostate cancer [facility])      Past Surgical History:  Procedure Laterality Date  . COLONOSCOPY    . MOHS SURGERY Right    scalp  . PROSTATE SURGERY  2010  . SHOULDER ARTHROSCOPY  2007   right  . TOTAL HIP ARTHROPLASTY Left 05/10/2016   Procedure: TOTAL HIP ARTHROPLASTY;  Surgeon: Garald Balding, MD;  Location: [place];  Service: Orthopedics;  Laterality: Left;  . UPPER GASTROINTESTINAL ENDOSCOPY     Family History  Problem Relation Age of Onset  . Colon polyps Father   . Diabetes Father   . Colon cancer Neg Hx   . Esophageal  cancer Neg Hx   . Rectal cancer Neg Hx   . Stomach cancer Neg Hx    Social History  Substance Use Topics  . Smoking status: Former Smoker    Packs/day: 0.25    Types: Cigarettes    Quit date: 04/27/1982  . Smokeless tobacco: Never Used  . Alcohol use 1.8 oz/week    3 Glasses of wine per week   Current Outpatient Prescriptions  Medication Sig Dispense Refill  . acetaminophen (TYLENOL) 500 MG tablet Take 1-2 tablets (500-1,000 mg total) by mouth every 6 (six) hours as needed. 30 tablet 0  . aspirin 81 MG tablet Take 81 mg by mouth daily.    . Calcium-Magnesium (CAL-MAG PO) Take 1 tablet by mouth daily.    Marland Kitchen CIALIS 5 MG tablet TAKE 1 TABLET DAILY AS DIRECTED. 30 tablet 6  . Coenzyme Q10 (CO Q10) 60 MG CAPS Take 1 tablet by mouth daily.    . fluticasone (FLONASE) 50 MCG/ACT nasal spray USE AS DIRECTED 32 g 6  . loratadine (CLARITIN) 10 MG tablet Take 10 mg by mouth daily.      . methocarbamol (ROBAXIN) 500 MG tablet Take 1 tablet (500 mg total) by mouth as needed for muscle spasms. 20 tablet 0  . Multiple Vitamin (MULTIVITAMIN) tablet Take 1 tablet by mouth daily.    . Omega-3 1000 MG CAPS Take 1 capsule by mouth daily.    Marland Kitchen  omeprazole (PRILOSEC) 20 MG capsule TAKE 1 CAPSULE (20 MG TOTAL) BY MOUTH DAILY. 90 capsule 0  . simvastatin (ZOCOR) 20 MG tablet TAKE 1 TABLET (20 MG TOTAL) BY MOUTH AT BEDTIME. 100 tablet 1   No current facility-administered medications for this visit.    Allergies  Allergen Reactions  . No Known Allergies      Review of Systems: All systems reviewed and negative except where noted in HPI.    Xr Lumbar Spine 2-3 Views  Result Date: 10/10/2016 Films of the lumbar spine obtained in AP and lateral projection. Straightening of the normal lordotic curve. Mild degenerative changes at L4-5 and L5-S1. no listhesis. Possible pars defect L5-S1 SI joints appear clear   Physical Exam: BP 116/80 (BP Location: Left Arm, Patient Position: Sitting, Cuff Size: Normal)    Pulse 80   Ht 5' 9.25" (1.759 m) Comment: height measured without shoes  Wt 199 lb (90.3 kg)   BMI 29.18 kg/m  Constitutional: Pleasant,well-developed, male in no acute distress. HEENT: Normocephalic and atraumatic. Conjunctivae are normal. No scleral icterus. Neck supple.  Cardiovascular: Normal rate, regular rhythm.  Pulmonary/chest: Effort normal and breath sounds normal. No wheezing, rales or rhonchi. Abdominal: Soft, nondistended, nontender. . There are no masses palpable. No hepatomegaly. Extremities: no edema Lymphadenopathy: No cervical adenopathy noted. Neurological: Alert and oriented to person place and time. Skin: Skin is warm and dry. No rashes noted. Psychiatric: Normal mood and affect. Behavior is normal.   ASSESSMENT AND PLAN: 61 y/o male here for follow up visit for recently diagnosed sigmoid diverticulitis. He was admitted and responded to antibiotics.  We discussed diverticulitis, natural history, risk of recurrence, long term management. His last colonoscopy was in 2010, recommend colonoscopy 6-8 weeks out from this episode to ensure no polyp / mass lesion which could have precipitated this. Otherwise recommend a daily fiber supplement, such as Citrucel. If he has any recurrence of symptoms in the interim I would have a low threshold to give him antibiotics.   We discussed long term management for his back pain. While he does take omeprazole for his history of GERD and protects his stomach, chronic NSAID use can increase the risk for renal disease and cardiovascular events. Would recommend he minimize he use of these long term and try using tylenol for pain control. He agreed.   Discussed risks / benefits of colonoscopy and sedation with him, he wished to proceed.   [place], MD [facility] Pager [phone]

## 2016-10-20 ENCOUNTER — Telehealth: Payer: Self-pay

## 2016-10-20 NOTE — Telephone Encounter (Signed)
Patient called stating that he is still having extreme pain in his lower back that is radiating into left leg.  Would like to know about having an MRI done?  CB# is [phone].  Please Advise.

## 2016-10-21 ENCOUNTER — Other Ambulatory Visit (INDEPENDENT_AMBULATORY_CARE_PROVIDER_SITE_OTHER): Payer: Self-pay

## 2016-10-21 DIAGNOSIS — M5440 Lumbago with sciatica, unspecified side: Secondary | ICD-10-CM

## 2016-10-21 NOTE — Telephone Encounter (Signed)
Spoke with pt. Regarding the MRI scan and his on going pain in his left leg and low back pain. He is taking 2-325mg  tylenol and muscle relaxers for the past 2 weeks and he is not feeling well.

## 2016-10-24 NOTE — Telephone Encounter (Signed)
Sent referral on FRiday

## 2016-10-24 NOTE — Telephone Encounter (Signed)
MRI L-S spine. Does he need pain meds?

## 2016-10-25 ENCOUNTER — Encounter: Payer: Self-pay | Admitting: Adult Health

## 2016-10-25 ENCOUNTER — Ambulatory Visit (INDEPENDENT_AMBULATORY_CARE_PROVIDER_SITE_OTHER): Payer: 59 | Admitting: Adult Health

## 2016-10-25 VITALS — BP 112/70 | Temp 98.1°F | Ht 69.25 in | Wt 199.7 lb

## 2016-10-25 DIAGNOSIS — Z7689 Persons encountering health services in other specified circumstances: Secondary | ICD-10-CM

## 2016-10-25 DIAGNOSIS — E782 Mixed hyperlipidemia: Secondary | ICD-10-CM

## 2016-10-25 DIAGNOSIS — K209 Esophagitis, unspecified without bleeding: Secondary | ICD-10-CM

## 2016-10-25 NOTE — Progress Notes (Signed)
Patient presents to clinic today to establish care. He is a pleasant 61 year old male who  has a past medical history of Allergy; Arthritis; Complication of anesthesia (2011); Diverticulitis; Diverticulosis; GERD (gastroesophageal reflux disease); History of Barrett's esophagus; History of kidney stones; Hyperlipidemia; and Prostate cancer ([place]).   He is a former patient of Dr. Sherren Mocha who had his last CPE in December 2016.   Acute Concerns: Establish Care   Chronic Issues: Hyperlipidemia - He takes Simvastatin 20 mg   GERD - Takes Prilosec   ED - Cialis   Prostate Cancer - prostate removal in 2010   Health Maintenance: Dental -- Does do routine care  Vision -- Does do routine care  Immunizations -- UTD  Colonoscopy -- Has an appointment July 29th  Diet: Tries to eat healthy.  Exercise: He likes to play golf, tennis and walk  He is followed by   Urology - [facility]   Orthopedics - Dr. Durward Fortes.   GI - Dr. Havery Moros   Dermatology - [facility]    Past Medical History:  Diagnosis Date  . Allergy   . Arthritis   . Complication of anesthesia 2011   "vitals dropped with endoscopy"  . Diverticulitis   . Diverticulosis   . GERD (gastroesophageal reflux disease)    on omeprazole  . History of Barrett's esophagus   . History of kidney stones    10-15 yrs ago  . Hyperlipidemia   . Prostate cancer [facility])     Past Surgical History:  Procedure Laterality Date  . COLONOSCOPY    . MOHS SURGERY Right    scalp  . PROSTATE SURGERY  2010  . SHOULDER ARTHROSCOPY  2007   right  . TOTAL HIP ARTHROPLASTY Left 05/10/2016   Procedure: TOTAL HIP ARTHROPLASTY;  Surgeon: Garald Balding, MD;  Location: [place];  Service: Orthopedics;  Laterality: Left;  . UPPER GASTROINTESTINAL ENDOSCOPY      Current Outpatient Prescriptions on File Prior to Visit  Medication Sig Dispense Refill  . acetaminophen (TYLENOL) 500 MG tablet Take 1-2 tablets (500-1,000 mg total) by  mouth every 6 (six) hours as needed. 30 tablet 0  . aspirin 81 MG tablet Take 81 mg by mouth daily.    . Calcium-Magnesium (CAL-MAG PO) Take 1 tablet by mouth daily.    Marland Kitchen CIALIS 5 MG tablet TAKE 1 TABLET DAILY AS DIRECTED. 30 tablet 6  . Coenzyme Q10 (CO Q10) 60 MG CAPS Take 1 tablet by mouth daily.    . fluticasone (FLONASE) 50 MCG/ACT nasal spray USE AS DIRECTED 32 g 6  . loratadine (CLARITIN) 10 MG tablet Take 10 mg by mouth daily.      . methocarbamol (ROBAXIN) 500 MG tablet Take 1 tablet (500 mg total) by mouth as needed for muscle spasms. 20 tablet 0  . Multiple Vitamin (MULTIVITAMIN) tablet Take 1 tablet by mouth daily.    . Omega-3 1000 MG CAPS Take 1 capsule by mouth daily.    Marland Kitchen omeprazole (PRILOSEC) 20 MG capsule TAKE 1 CAPSULE (20 MG TOTAL) BY MOUTH DAILY. 90 capsule 0  . simvastatin (ZOCOR) 20 MG tablet TAKE 1 TABLET (20 MG TOTAL) BY MOUTH AT BEDTIME. 100 tablet 1   No current facility-administered medications on file prior to visit.     Allergies  Allergen Reactions  . No Known Allergies     Family History  Problem Relation Age of Onset  . Colon polyps Father   . Diabetes Father   .  Melanoma Mother   . Melanoma Maternal Grandmother   . Colon cancer Neg Hx   . Esophageal cancer Neg Hx   . Rectal cancer Neg Hx   . Stomach cancer Neg Hx     Social History   Social History  . Marital status: Married    Spouse name: N/A  . Number of children: N/A  . Years of education: N/A   Occupational History  . Not on file.   Social History Main Topics  . Smoking status: Former Smoker    Packs/day: 0.25    Types: Cigarettes    Quit date: 04/27/1982  . Smokeless tobacco: Never Used  . Alcohol use 1.8 oz/week    3 Glasses of wine per week  . Drug use: No  . Sexual activity: Not on file   Other Topics Concern  . 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   HENT: Negative.   Eyes: Negative.   Respiratory: Negative.   Cardiovascular: Negative.   Gastrointestinal: Negative.   Genitourinary: Negative.   Musculoskeletal: Positive for back pain and joint pain.  Skin: Negative.   Neurological: Negative.   Psychiatric/Behavioral: Negative.   All other systems reviewed and are negative.   BP 112/70 (BP Location: Left Arm, Patient Position: Sitting, Cuff Size: Normal)   Temp 98.1 F (36.7 C) (Oral)   Ht 5' 9.25" (1.759 m)   Wt 199 lb 11.2 oz (90.6 kg)   BMI 29.28 kg/m   Physical Exam  Constitutional: He is oriented to person, place, and time and well-developed, well-nourished, and in no distress. No distress.  HENT:  Head: Normocephalic and atraumatic.  Right Ear: External ear normal.  Left Ear: External ear normal.  Nose: Nose normal.  Mouth/Throat: Oropharynx is clear and moist. No oropharyngeal exudate.  Eyes: Conjunctivae and EOM are normal. Pupils are equal, round, and reactive to light. Right eye exhibits no discharge. Left eye exhibits no discharge. No scleral icterus.  Neck: Normal range of motion. Neck supple. No JVD present. No tracheal deviation present. No thyromegaly present.  Cardiovascular: Normal rate, regular rhythm, normal heart sounds and intact distal pulses.  Exam reveals no gallop and no friction rub.   No murmur heard. Pulmonary/Chest: Effort normal and breath sounds normal. No stridor. No respiratory distress. He has no wheezes. He has no rales. He exhibits no tenderness.  Musculoskeletal: Normal range of motion.  Lymphadenopathy:    He has no cervical adenopathy.  Neurological: He is alert and oriented to person, place, and time. He displays normal reflexes. No cranial nerve deficit. He exhibits normal muscle tone. Coordination normal. GCS score is 15.  Skin: Skin is warm and dry. No rash noted. He is not diaphoretic. No erythema. No pallor.  Psychiatric: Mood, memory, affect and  judgment normal.  Nursing note and vitals reviewed.  Assessment/Plan: 1. Encounter to establish care - Follow up for CPE in August - Continue to work in diet and exercise  2. Mixed hyperlipidemia - Continue with simvastatin  - Will check lipid panel in August   3. Acute esophagitis - Continue with Prilosec as directed by GI   Dorothyann Peng, NP

## 2016-10-25 NOTE — Patient Instructions (Signed)
It was great meeting you today   I will see you after your colonoscopy for your physical. If you need anything before that, please let me know    Good luck with the MRI

## 2016-10-31 ENCOUNTER — Ambulatory Visit
Admission: RE | Admit: 2016-10-31 | Discharge: 2016-10-31 | Disposition: A | Discharge status: Home / Self Care | Payer: 59 | Source: Ambulatory Visit | Attending: Orthopaedic Surgery | Admitting: Orthopaedic Surgery

## 2016-10-31 DIAGNOSIS — M5440 Lumbago with sciatica, unspecified side: Secondary | ICD-10-CM

## 2016-11-01 ENCOUNTER — Ambulatory Visit: Payer: 59 | Attending: Orthopaedic Surgery | Admitting: Physical Therapy

## 2016-11-03 ENCOUNTER — Other Ambulatory Visit: Payer: Self-pay | Admitting: Family Medicine

## 2016-11-03 NOTE — Progress Notes (Signed)
Needs either left S1 nerve root injection or ESI-please call [facility] and schedule

## 2016-11-04 ENCOUNTER — Other Ambulatory Visit (INDEPENDENT_AMBULATORY_CARE_PROVIDER_SITE_OTHER): Payer: Self-pay | Admitting: Orthopaedic Surgery

## 2016-11-04 ENCOUNTER — Other Ambulatory Visit (INDEPENDENT_AMBULATORY_CARE_PROVIDER_SITE_OTHER): Payer: Self-pay

## 2016-11-04 DIAGNOSIS — M5440 Lumbago with sciatica, unspecified side: Secondary | ICD-10-CM

## 2016-11-18 ENCOUNTER — Ambulatory Visit (INDEPENDENT_AMBULATORY_CARE_PROVIDER_SITE_OTHER): Payer: 59 | Admitting: Orthopaedic Surgery

## 2016-11-18 ENCOUNTER — Encounter: Payer: Self-pay | Admitting: Gastroenterology

## 2016-11-28 ENCOUNTER — Ambulatory Visit (AMBULATORY_SURGERY_CENTER): Payer: 59 | Admitting: Gastroenterology

## 2016-11-28 ENCOUNTER — Encounter: Payer: Self-pay | Admitting: Gastroenterology

## 2016-11-28 VITALS — BP 113/89 | HR 63 | Temp 98.7°F | Resp 12 | Ht 69.25 in | Wt 199.0 lb

## 2016-11-28 DIAGNOSIS — R933 Abnormal findings on diagnostic imaging of other parts of digestive tract: Secondary | ICD-10-CM

## 2016-11-28 DIAGNOSIS — K5732 Diverticulitis of large intestine without perforation or abscess without bleeding: Secondary | ICD-10-CM | POA: Diagnosis present

## 2016-11-28 DIAGNOSIS — D122 Benign neoplasm of ascending colon: Secondary | ICD-10-CM

## 2016-11-28 MED ORDER — SODIUM CHLORIDE 0.9 % IV SOLN: 500.0000 mL | INTRAVENOUS | Status: DC

## 2016-11-28 NOTE — Progress Notes (Signed)
Pt's states no medical or surgical changes since previsit or office visit.  No egg or soy allergy  

## 2016-11-28 NOTE — Op Note (Signed)
On[facility] Patient Name: Daniel Booth Procedure Date: 11/28/2016 3:34 PM MRN: [id number] Endoscopist: Remo Lipps P. Ikea Demicco MD, MD Age: 61 Referring MD:  Date of Birth: July 23, 1955 Gender: Male Account #: [id number] Procedure:                Colonoscopy Indications:              Abnormal CT of the GI tract, Follow-up of                            diverticulitis Medicines:                Monitored Anesthesia Care Procedure:                Pre-Anesthesia Assessment:                           - Prior to the procedure, a History and Physical                            was performed, and patient medications and                            allergies were reviewed. The patient's tolerance of                            previous anesthesia was also reviewed. The risks                            and benefits of the procedure and the sedation                            options and risks were discussed with the patient.                            All questions were answered, and informed consent                            was obtained. Prior Anticoagulants: The patient has                            taken no previous anticoagulant or antiplatelet                            agents. ASA Grade Assessment: II - A patient with                            mild systemic disease. After reviewing the risks                            and benefits, the patient was deemed in                            satisfactory condition to undergo the procedure.  After obtaining informed consent, the colonoscope                            was passed under direct vision. Throughout the                            procedure, the patient's blood pressure, pulse, and                            oxygen saturations were monitored continuously. The                            Colonoscope was introduced through the anus and                            advanced to the the cecum, identified by                      appendiceal orifice and ileocecal valve. The                            colonoscopy was performed without difficulty. The                            patient tolerated the procedure well. The quality                            of the bowel preparation was adequate. The                            ileocecal valve, appendiceal orifice, and rectum                            were photographed. Scope In: 3:40:56 PM Scope Out: 4:03:40 PM Scope Withdrawal Time: 0 hours 18 minutes 20 seconds  Total Procedure Duration: 0 hours 22 minutes 44 seconds  Findings:                 The perianal and digital rectal examinations were                            normal.                           Many small and large-mouthed diverticula were found                            in the entire colon. Highest burden noted in the                            sigmoid colon.                           A 4 mm polyp was found in the ascending colon. The  polyp was sessile. The polyp was removed with a                            cold snare. Resection and retrieval were complete.                            There was more than expected oozing after                            polypectomy which was monitored for a few minutes,                            hemostasis occured without intervention.                           Internal hemorrhoids were found during                            retroflexion. The hemorrhoids were small.                           The exam was otherwise without abnormality. Complications:            No immediate complications. Estimated blood loss:                            Minimal. Estimated Blood Loss:     Estimated blood loss was minimal. Impression:               - Diverticulosis in the entire examined colon,                            highest burden in the left colon which correlates                            to recent diagnosis of sigmoid diverticulitis.                            - One 4 mm polyp in the ascending colon, removed                            with a cold snare. Resected and retrieved.                           - Internal hemorrhoids.                           - The examination was otherwise normal. Recommendation:           - Patient has a contact number available for                            emergencies. The signs and symptoms of potential                            delayed complications  were discussed with the                            patient. Return to normal activities tomorrow.                            Written discharge instructions were provided to the                            patient.                           - Resume previous diet.                           - Continue present medications.                           - Await pathology results.                           - Repeat colonoscopy is recommended for                            surveillance. The colonoscopy date will be                            determined after pathology results from today's                            exam become available for review.                           - No ibuprofen, naproxen, or other non-steroidal                            anti-inflammatory drugs for 2 weeks after polyp                            removal. Remo Lipps P. Leyah Bocchino MD, MD 11/28/2016 4:10:01 PM This report has been signed electronically.

## 2016-11-28 NOTE — Progress Notes (Signed)
A/ox3 pleased with MAC, report to Michele RN 

## 2016-11-28 NOTE — Progress Notes (Signed)
Called to room to assist during endoscopic procedure.  Patient ID and intended procedure confirmed with present staff. Received instructions for my participation in the procedure from the performing physician.  

## 2016-11-28 NOTE — Patient Instructions (Signed)
YOU HAD AN ENDOSCOPIC PROCEDURE TODAY AT [facility]:   Refer to the procedure report that was given to you for any specific questions about what was found during the examination.  If the procedure report does not answer your questions, please call your gastroenterologist to clarify.  If you requested that your care partner not be given the details of your procedure findings, then the procedure report has been included in a sealed envelope for you to review at your convenience later.  YOU SHOULD EXPECT: Some feelings of bloating in the abdomen. Passage of more gas than usual.  Walking can help get rid of the air that was put into your GI tract during the procedure and reduce the bloating. If you had a lower endoscopy (such as a colonoscopy or flexible sigmoidoscopy) you may notice spotting of blood in your stool or on the toilet paper. If you underwent a bowel prep for your procedure, you may not have a normal bowel movement for a few days.  Please Note:  You might notice some irritation and congestion in your nose or some drainage.  This is from the oxygen used during your procedure.  There is no need for concern and it should clear up in a day or so.  SYMPTOMS TO REPORT IMMEDIATELY:   Following lower endoscopy (colonoscopy or flexible sigmoidoscopy):  Excessive amounts of blood in the stool  Significant tenderness or worsening of abdominal pains  Swelling of the abdomen that is new, acute  Fever of 100F or higher  For urgent or emergent issues, a gastroenterologist can be reached at any hour by calling [phone].   DIET:  We do recommend a small meal at first, but then you may proceed to your regular diet.  Drink plenty of fluids but you should avoid alcoholic beverages for 24 hours.  ACTIVITY:  You should plan to take it easy for the rest of today and you should NOT DRIVE or use heavy machinery until tomorrow (because of the sedation medicines used during the test).     FOLLOW UP: Our staff will call the number listed on your records the next business day following your procedure to check on you and address any questions or concerns that you may have regarding the information given to you following your procedure. If we do not reach you, we will leave a message.  However, if you are feeling well and you are not experiencing any problems, there is no need to return our call.  We will assume that you have returned to your regular daily activities without incident.  If any biopsies were taken you will be contacted by phone or by letter within the next 1-3 weeks.  Please call us at [phone] if you have not heard about the biopsies in 3 weeks.   Await for biopsy results to determine next repeat Colonoscopy Diverticulosis (handout given) Hemorrhoids (handout given) Polyp (handout given) No ibuprofen, naproxen or other non-steroidal anti-inflammatory drugs for 2 weeks after polyp removal. Tylenol okay if needed.  SIGNATURES/CONFIDENTIALITY: You and/or your care partner have signed paperwork which will be entered into your electronic medical record.  These signatures attest to the fact that that the information above on your After Visit Summary has been reviewed and is understood.  Full responsibility of the confidentiality of this discharge information lies with you and/or your care-partner.

## 2016-11-29 ENCOUNTER — Telehealth: Payer: Self-pay | Admitting: *Deleted

## 2016-11-29 NOTE — Telephone Encounter (Signed)
  Follow up Call-  Call back number 11/28/2016 03/09/2015  Post procedure Call Back phone  # [phone]  Permission to leave phone message Yes Yes  Some recent data might be hidden     Patient questions:  Do you have a fever, pain , or abdominal swelling? No. Pain Score  0 *  Have you tolerated food without any problems? Yes.    Have you been able to return to your normal activities? Yes.    Do you have any questions about your discharge instructions: Diet   No. Medications  No. Follow up visit  No.  Do you have questions or concerns about your Care? No.  Actions: * If pain score is 4 or above: No action needed, pain <4.

## 2016-12-02 ENCOUNTER — Encounter: Payer: Self-pay | Admitting: Gastroenterology

## 2016-12-07 ENCOUNTER — Ambulatory Visit (INDEPENDENT_AMBULATORY_CARE_PROVIDER_SITE_OTHER): Payer: 59 | Admitting: Adult Health

## 2016-12-07 ENCOUNTER — Telehealth (INDEPENDENT_AMBULATORY_CARE_PROVIDER_SITE_OTHER): Payer: Self-pay | Admitting: Orthopaedic Surgery

## 2016-12-07 ENCOUNTER — Encounter: Payer: Self-pay | Admitting: Adult Health

## 2016-12-07 VITALS — BP 116/80 | Temp 97.9°F | Ht 70.0 in | Wt 203.0 lb

## 2016-12-07 DIAGNOSIS — C61 Malignant neoplasm of prostate: Secondary | ICD-10-CM

## 2016-12-07 DIAGNOSIS — Z0000 Encounter for general adult medical examination without abnormal findings: Secondary | ICD-10-CM | POA: Diagnosis not present

## 2016-12-07 DIAGNOSIS — Z1159 Encounter for screening for other viral diseases: Secondary | ICD-10-CM

## 2016-12-07 DIAGNOSIS — E782 Mixed hyperlipidemia: Secondary | ICD-10-CM

## 2016-12-07 LAB — CBC WITH DIFFERENTIAL/PLATELET
BASOS PCT: 0.3 % (ref 0.0–3.0)
Basophils Absolute: 0 10*3/uL (ref 0.0–0.1)
EOS PCT: 1.8 % (ref 0.0–5.0)
Eosinophils Absolute: 0.1 10*3/uL (ref 0.0–0.7)
HEMATOCRIT: 41.2 % (ref 39.0–52.0)
Hemoglobin: 13.8 g/dL (ref 13.0–17.0)
LYMPHS ABS: 1.1 10*3/uL (ref 0.7–4.0)
LYMPHS PCT: 21.9 % (ref 12.0–46.0)
MCHC: 33.6 g/dL (ref 30.0–36.0)
MCV: 92.4 fl (ref 78.0–100.0)
MONOS PCT: 7 % (ref 3.0–12.0)
Monocytes Absolute: 0.4 10*3/uL (ref 0.1–1.0)
NEUTROS ABS: 3.6 10*3/uL (ref 1.4–7.7)
NEUTROS PCT: 69 % (ref 43.0–77.0)
PLATELETS: 307 10*3/uL (ref 150.0–400.0)
RBC: 4.46 Mil/uL (ref 4.22–5.81)
RDW: 13.7 % (ref 11.5–15.5)
WBC: 5.2 10*3/uL (ref 4.0–10.5)

## 2016-12-07 LAB — HEPATIC FUNCTION PANEL
ALBUMIN: 4.6 g/dL (ref 3.5–5.2)
ALK PHOS: 84 U/L (ref 39–117)
ALT: 28 U/L (ref 0–53)
AST: 24 U/L (ref 0–37)
Bilirubin, Direct: 0.1 mg/dL (ref 0.0–0.3)
TOTAL PROTEIN: 6.8 g/dL (ref 6.0–8.3)
Total Bilirubin: 0.5 mg/dL (ref 0.2–1.2)

## 2016-12-07 LAB — BASIC METABOLIC PANEL
BUN: 14 mg/dL (ref 6–23)
CALCIUM: 9.7 mg/dL (ref 8.4–10.5)
CHLORIDE: 103 meq/L (ref 96–112)
CO2: 33 meq/L — AB (ref 19–32)
Creatinine, Ser: 0.93 mg/dL (ref 0.40–1.50)
GFR: 87.83 mL/min (ref >60.00)
GLUCOSE: 106 mg/dL — AB (ref 70–99)
Potassium: 4.5 mEq/L (ref 3.5–5.1)
SODIUM: 140 meq/L (ref 135–145)

## 2016-12-07 LAB — LIPID PANEL
CHOLESTEROL: 234 mg/dL — AB (ref 0–200)
HDL: 64 mg/dL (ref >39.00)
LDL Cholesterol: 138 mg/dL — ABNORMAL HIGH (ref 0–99)
NonHDL: 170.12
Total CHOL/HDL Ratio: 4
Triglycerides: 162 mg/dL — ABNORMAL HIGH (ref 0.0–149.0)
VLDL: 32.4 mg/dL (ref 0.0–40.0)

## 2016-12-07 LAB — TSH: TSH: 1.46 u[IU]/mL (ref 0.35–4.50)

## 2016-12-07 MED ORDER — TADALAFIL 5 MG PO TABS: 5.0000 mg | ORAL_TABLET | Freq: Every day | ORAL | 6 refills | Status: DC

## 2016-12-07 MED ORDER — SIMVASTATIN 20 MG PO TABS: 20.0000 mg | ORAL_TABLET | Freq: Every day | ORAL | 3 refills | Status: DC

## 2016-12-07 MED ORDER — OMEPRAZOLE 20 MG PO CPDR: 20.0000 mg | DELAYED_RELEASE_CAPSULE | Freq: Every day | ORAL | 3 refills | Status: DC

## 2016-12-07 NOTE — Addendum Note (Signed)
Addended by: Dorothyann Peng L on: 12/07/2016 09:00 AM   Modules accepted: Orders

## 2016-12-07 NOTE — Progress Notes (Addendum)
Subjective:    Patient ID: Daniel Booth, male    DOB: 07-12-1955, 61 y.o.   MRN: [id number]  HPI Patient presents for yearly preventative medicine examination. He is a pleasant 61 year old male who  has a past medical history of Allergy; Arthritis; Complication of anesthesia (2011); Diverticulitis; Diverticulosis; GERD (gastroesophageal reflux disease); History of Barrett's esophagus; History of kidney stones; Hyperlipidemia; and Prostate cancer ([place]).  All immunizations and health maintenance protocols were reviewed with the patient and needed orders were placed.  Appropriate screening laboratory values were ordered for the patient including screening of hyperlipidemia, renal function and hepatic function.  Medication reconciliation,  past medical history, social history, problem list and allergies were reviewed in detail with the patient  Goals were established with regard to weight loss, exercise, and  diet in compliance with medications. He is very active and eats a heart healthy diet   End of life planning was discussed. He has an advanced directive and living will   Hyperlipidemia - He takes Simvastatin 20 mg - stable   GERD - Takes Prilosec - stable   ED - Cialis   Prostate Cancer - prostate removal in 2010   He is up to date on vision and dental screens. He had his colonoscopy last week.   He complains of continued left hip and hamstring pain s/p left hip replacement. He is going to call Dr. Tanda Rockers about starting physical therapy   He has been seen by Urology and Dermatology this year for routine exams  Review of Systems  Constitutional: Negative.   HENT: Negative.   Eyes: Negative.   Respiratory: Negative.   Cardiovascular: Negative.   Gastrointestinal: Negative.   Endocrine: Negative.   Genitourinary: Negative.   Musculoskeletal:       Left hip pain   Skin: Negative.   Allergic/Immunologic: Negative.   Hematological: Negative.     Psychiatric/Behavioral: Negative.   All other systems reviewed and are negative.  Past Medical History:  Diagnosis Date  . Allergy   . Arthritis   . Complication of anesthesia 2011   "vitals dropped with endoscopy"  . Diverticulitis   . Diverticulosis   . GERD (gastroesophageal reflux disease)    on omeprazole  . History of Barrett's esophagus   . History of kidney stones    10-15 yrs ago  . Hyperlipidemia   . Prostate cancer [facility])     Social History   Social History  . Marital status: Married    Spouse name: N/A  . Number of children: N/A  . Years of education: N/A   Occupational History  . Not on file.   Social History Main Topics  . Smoking status: Former Smoker    Packs/day: 0.25    Types: Cigarettes    Quit date: 04/27/1982  . Smokeless tobacco: Never Used  . Alcohol use 2.4 oz/week    4 Glasses of wine per week  . Drug use: No  . Sexual activity: Not on file   Other Topics Concern  . Not on file   Social History Narrative   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    Married    No kids           Past Surgical History:  Procedure Laterality Date  . COLONOSCOPY    . MOHS SURGERY Right    scalp  . PROSTATE SURGERY  2010  . SHOULDER ARTHROSCOPY  2007   right  . TOTAL HIP ARTHROPLASTY Left  05/10/2016   Procedure: TOTAL HIP ARTHROPLASTY;  Surgeon: Garald Balding, MD;  Location: [facility];  Service: Orthopedics;  Laterality: Left;  . UPPER GASTROINTESTINAL ENDOSCOPY      Family History  Problem Relation Age of Onset  . Colon polyps Father   . Diabetes Father   . Melanoma Mother   . Melanoma Maternal Grandmother   . Colon cancer Neg Hx   . Esophageal cancer Neg Hx   . Rectal cancer Neg Hx   . Stomach cancer Neg Hx     Allergies  Allergen Reactions  . No Known Allergies     Current Outpatient Prescriptions on File Prior to Visit  Medication Sig Dispense Refill  . acetaminophen (TYLENOL) 500 MG tablet Take 1-2 tablets (500-1,000 mg  total) by mouth every 6 (six) hours as needed. 30 tablet 0  . aspirin 81 MG tablet Take 81 mg by mouth daily.    . Calcium-Magnesium (CAL-MAG PO) Take 1 tablet by mouth daily.    . Coenzyme Q10 (CO Q10) 60 MG CAPS Take 1 tablet by mouth daily.    . fluticasone (FLONASE) 50 MCG/ACT nasal spray USE AS DIRECTED 32 g 6  . loratadine (CLARITIN) 10 MG tablet Take 10 mg by mouth daily.      . methocarbamol (ROBAXIN) 500 MG tablet Take 1 tablet (500 mg total) by mouth as needed for muscle spasms. 20 tablet 0  . Multiple Vitamin (MULTIVITAMIN) tablet Take 1 tablet by mouth daily.    . Omega-3 1000 MG CAPS Take 1 capsule by mouth daily.     No current facility-administered medications on file prior to visit.     BP 116/80 (BP Location: Left Arm)   Temp 97.9 F (36.6 C) (Oral)   Ht 5\' 10"  (1.778 m)   Wt 203 lb (92.1 kg)   BMI 29.13 kg/m       Objective:   Physical Exam  Constitutional: He is oriented to person, place, and time. He appears well-developed and well-nourished. No distress.  HENT:  Head: Normocephalic and atraumatic.  Right Ear: External ear normal.  Left Ear: External ear normal.  Nose: Nose normal.  Mouth/Throat: Oropharynx is clear and moist. No oropharyngeal exudate.  Eyes: Pupils are equal, round, and reactive to light. Conjunctivae are normal. Right eye exhibits no discharge. Left eye exhibits no discharge.  Neck: Normal range of motion. Neck supple. No JVD present. No tracheal deviation present. No thyromegaly present.  Cardiovascular: Normal rate, regular rhythm, normal heart sounds and intact distal pulses.  Exam reveals no gallop and no friction rub.   No murmur heard. Pulmonary/Chest: Effort normal and breath sounds normal. No stridor. No respiratory distress. He has no wheezes. He has no rales. He exhibits no tenderness.  Abdominal: Soft. Bowel sounds are normal. He exhibits no distension and no mass. There is no tenderness. There is no rebound and no guarding.    Genitourinary:  Genitourinary Comments: Deferred: prostate removed   Musculoskeletal: Normal range of motion. He exhibits no edema, tenderness or deformity.  Lymphadenopathy:    He has no cervical adenopathy.  Neurological: He is alert and oriented to person, place, and time. He displays normal reflexes. No cranial nerve deficit. He exhibits normal muscle tone. Coordination normal.  Skin: Skin is warm and dry. No rash noted. He is not diaphoretic. No erythema. No pallor.  Scattered moles trhoughout torso  Surgical scar on abdomen   Psychiatric: He has a normal mood and affect. His behavior is normal.  Judgment and thought content normal.  Nursing note and vitals reviewed.     Assessment & Plan:  1. Routine general medical examination at a health care facility - Continue to exercise and eat healthy  - Follow up with me in one year or sooner if needed - Follow up with specialists as needed - Basic metabolic panel - CBC with Differential/Platelet - Hepatic function panel - Lipid panel - TSH  2. Mixed hyperlipidemia - Consider increasing statin  - Basic metabolic panel - CBC with Differential/Platelet - Hepatic function panel - Lipid panel - TSH  3. Need for hepatitis C screening test  - Hep C Antibody  .Dorothyann Peng, NP

## 2016-12-07 NOTE — Telephone Encounter (Signed)
May set up PT.  If he fails then we can set up injections by Dr Ernestina Patches

## 2016-12-07 NOTE — Patient Instructions (Signed)
It was great seeing you today   I will follow up with you regarding your blood work   Please see me in one year or sooner if needed  Patient presents for yearly preventative medicine examination.   All immunizations and health maintenance protocols were reviewed with the patient and needed orders were placed.  Appropriate screening laboratory values were ordered for the patient including screening of hyperlipidemia, renal function and hepatic function. If indicated by BPH, a PSA was ordered.  Medication reconciliation,  past medical history, social history, problem list and allergies were reviewed in detail with the patient  Goals were established with regard to weight loss, exercise, and  diet in compliance with medications  End of life planning was discussed.

## 2016-12-07 NOTE — Telephone Encounter (Signed)
Patient states he has a protruding L5 and is experiencing leg pain. He says he has already had an MRI and would like to set up physical therapy. (he states he was told therapy was the next step for him). Please advise.

## 2016-12-08 LAB — HEPATITIS C ANTIBODY: HCV Ab: REACTIVE — AB

## 2016-12-09 ENCOUNTER — Telehealth (INDEPENDENT_AMBULATORY_CARE_PROVIDER_SITE_OTHER): Payer: Self-pay | Admitting: Orthopaedic Surgery

## 2016-12-09 ENCOUNTER — Other Ambulatory Visit (INDEPENDENT_AMBULATORY_CARE_PROVIDER_SITE_OTHER): Payer: Self-pay

## 2016-12-09 DIAGNOSIS — M5440 Lumbago with sciatica, unspecified side: Secondary | ICD-10-CM

## 2016-12-09 NOTE — Telephone Encounter (Signed)
Spoke with patient and we agreed to [facility] for PT for LBP. Sent referral

## 2016-12-09 NOTE — Telephone Encounter (Signed)
Patient returning your call.

## 2016-12-09 NOTE — Telephone Encounter (Signed)
LVMOM to set up PT

## 2016-12-10 LAB — HEPATITIS C RNA QUANTITATIVE
HCV Quantitative Log: <1.18 Log IU/mL
HCV Quantitative: <15 IU/mL

## 2016-12-13 ENCOUNTER — Telehealth (INDEPENDENT_AMBULATORY_CARE_PROVIDER_SITE_OTHER): Payer: Self-pay | Admitting: Orthopaedic Surgery

## 2016-12-13 ENCOUNTER — Other Ambulatory Visit (INDEPENDENT_AMBULATORY_CARE_PROVIDER_SITE_OTHER): Payer: Self-pay

## 2016-12-13 DIAGNOSIS — M5440 Lumbago with sciatica, unspecified side: Secondary | ICD-10-CM

## 2016-12-13 NOTE — Telephone Encounter (Signed)
Patient left a message stating he has not heard anything from Physical Therapy. He is requesting a status update.

## 2016-12-13 NOTE — Telephone Encounter (Signed)
Would you please make a copy of MRI lumbar from 10/31/16. \Please call when ready .[phone]

## 2016-12-22 ENCOUNTER — Ambulatory Visit: Payer: 59 | Attending: Orthopaedic Surgery | Admitting: Physical Therapy

## 2016-12-22 DIAGNOSIS — M5442 Lumbago with sciatica, left side: Secondary | ICD-10-CM | POA: Insufficient documentation

## 2016-12-22 DIAGNOSIS — R2689 Other abnormalities of gait and mobility: Secondary | ICD-10-CM | POA: Insufficient documentation

## 2016-12-22 NOTE — Therapy (Signed)
[place], [place], [address] Phone: [phone]   Fax:  [phone]  Physical Therapy Evaluation  Patient Details  Name: Daniel Booth MRN: [id number] Date of Birth: 07/10/1955 Referring Provider: Joni Fears, MD (see Sherley Bounds, MD (neurosurgery) Monday)  Encounter Date: 12/22/2016      PT End of Session - 12/22/16 1049    Visit Number 1   Number of Visits 12   Date for PT Re-Evaluation 02/02/17   Authorization Type UHC 60 visit limit   PT Start Time 0805   PT Stop Time 0842   PT Time Calculation (min) 37 min   Activity Tolerance Patient tolerated treatment well;Patient limited by pain   Behavior During Therapy [facility] for tasks assessed/performed      Past Medical History:  Diagnosis Date  . Allergy   . Arthritis   . Complication of anesthesia 2011   "vitals dropped with endoscopy"  . Diverticulitis   . Diverticulosis   . GERD (gastroesophageal reflux disease)    on omeprazole  . History of Barrett's esophagus   . History of kidney stones    10-15 yrs ago  . Hyperlipidemia   . Prostate cancer [facility])     Past Surgical History:  Procedure Laterality Date  . COLONOSCOPY    . MOHS SURGERY Right    scalp  . PROSTATE SURGERY  2010  . SHOULDER ARTHROSCOPY  2007   right  . TOTAL HIP ARTHROPLASTY Left 05/10/2016   Procedure: TOTAL HIP ARTHROPLASTY;  Surgeon: Garald Balding, MD;  Location: [place];  Service: Orthopedics;  Laterality: Left;  . UPPER GASTROINTESTINAL ENDOSCOPY      There were no vitals filed for this visit.       Subjective Assessment - 12/22/16 0806    Subjective Pt is a 61 y/o male who presents to OPPT for low back pain and LLE pain.  Pt reports he had Lt THA in Jan 2018 and cleared to play golf in May.  While playing golf developed sudden onset of pain in back.  Pt now reports inability to play golf or tennis, and expresses some difficulty with ADLs.  Has appt with  neurosurgery on Monday.   Limitations House hold activities;Sitting   How long can you sit comfortably? couple hours-has to shift and reposition   Diagnostic tests MRI: protruding L5 disc   Patient Stated Goals improve pain, return to golf/tennis   Currently in Pain? Yes   Pain Score 4   up to 6/10   Pain Location Hip   Pain Orientation Lower;Left   Pain Descriptors / Indicators Jabbing;Aching  "deep ache"   Pain Type Acute pain;Chronic pain   Pain Radiating Towards LLE mostly to thigh, occasionally to foot   Pain Onset More than a month ago   Pain Frequency Intermittent   Aggravating Factors  sitting, driving   Pain Relieving Factors stretching, hamstring stretching            [facility] PT Assessment - 12/22/16 0813      Assessment   Medical Diagnosis LBP, Lt hip pain   Referring Provider Joni Fears, MD  see Sherley Bounds, MD (neurosurgery) Monday   Onset Date/Surgical Date --  May 2018   Next MD Visit Dr Ronnald Ramp 12/26/16; Dr Durward Fortes PRN   Prior Therapy none for this condition     Precautions   Precautions None     Restrictions   Weight Bearing Restrictions No  Balance Screen   Has the patient fallen in the past 6 months No   Has the patient had a decrease in activity level because of a fear of falling?  No   Is the patient reluctant to leave their home because of a fear of falling?  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   Leisure tennis, golf, walking     Cognition   Overall Cognitive Status Within Functional Limits for tasks assessed     Observation/Other Assessments   Focus on Therapeutic Outcomes (FOTO)  59 (41% limited; predicted 26% limited)     Posture/Postural Control   Posture/Postural Control Postural limitations   Postural Limitations Rounded Shoulders;Forward head     ROM / Strength   AROM / PROM / Strength AROM;Strength     AROM   AROM Assessment Site Lumbar   Lumbar Flexion 70   Lumbar Extension 25   Lumbar - Right Side Bend 23  pain at end range   Lumbar - Left Side Bend 32  pain returning to neutral     Strength   Strength Assessment Site Hip;Knee;Ankle   Right/Left Hip Right;Left   Right Hip Flexion 5/5   Right Hip Extension 5/5   Right Hip External Rotation  5/5   Right Hip Internal Rotation 5/5   Left Hip Flexion 5/5   Left Hip Extension 4/5   Left Hip External Rotation 4/5   Left Hip Internal Rotation 4/5   Left Hip ABduction 5/5   Right/Left Knee Right;Left   Right Knee Flexion 5/5   Right Knee Extension 5/5   Left Knee Flexion 3+/5  with pain   Left Knee Extension 5/5   Right/Left Ankle Right;Left   Right Ankle Dorsiflexion 5/5   Left Ankle Dorsiflexion 5/5     Flexibility   Soft Tissue Assessment /Muscle Length yes   Hamstrings tightness Lt > Rt   Piriformis tightness Lt     Palpation   Spinal mobility tenderness and pain L4/5   SI assessment  WNL   Palpation comment signficant pain and trigger points noted in Lt hamstrings     Special Tests    Special Tests Lumbar   Lumbar Tests Straight Leg Raise;other     Straight Leg Raise   Findings Negative     other   Comments prolonged hold prone on elbows and prone extension-pt reported centralization     Ambulation/Gait   Gait Pattern Decreased stance time - left;Decreased step length - right;Antalgic            Objective measurements completed on examination: See above findings.           [facility] Adult PT Treatment/Exercise - 12/22/16 0813      Self-Care   Self-Care Other Self-Care Comments   Other Self-Care Comments  educated on use of tennis ball for myofascial release of hamstrings     Exercises  Exercises Lumbar     Lumbar Exercises: Stretches   Passive Hamstring Stretch Limitations demonstrated to pt - pt verbalized understanding; did not perform   Prone on Elbows Stretch 5 reps;60 seconds  5 min   Press Ups 5 reps  with centralization of symptoms     Lumbar Exercises: Standing   Other Standing Lumbar Exercises educated on standing lumbar extension; pt performed 3-4 reps for HEP                PT Education - 12/22/16 1049    Education provided Yes   Education Details HEP, clinical findings, POC, goals of care   Person(s) Educated Patient   Methods Explanation;Demonstration;Handout   Comprehension Verbalized understanding;Returned demonstration;Need further instruction             PT Long Term Goals - 12/22/16 1055      PT LONG TERM GOAL #1   Title independent with HEP   Time 6   Period Weeks   Target Date 02/02/17     PT LONG TERM GOAL #2   Title verbalize understanding of posture/body mechanics to decrease risk of reinjury    Time 6   Period Weeks   Status New   Target Date 02/02/17     PT LONG TERM GOAL #3   Title perform lumbar ROM without increase in pain for improved function   Time 6   Period Weeks   Status New   Target Date 02/02/17     PT LONG TERM GOAL #4   Title perform simulated golf activities without increase in pain for improved function   Time 6   Period Weeks   Status New   Target Date 02/02/17     PT LONG TERM GOAL #5   Title report 75% improvement in pain while driving for improved tolerance to work responsibilities   Time 6   Period Weeks   Status New   Target Date 02/02/17                Plan - 12/22/16 1050    Clinical Impression Statement Pt is a 61 y/o male who presents  to OPPT for Lt hip and back pain.  Pt demonstrates gait abnormalities, decreased motion and strength affecting functional mobility and ability to return to recreational activities.  Pt with significant Lt hamstring tightess and may benefit from TDN but initial exercises focusing on extension based program as pt reports centralization of symptoms with prone on elbows.  Will benefit from PT to maximize function.   History and Personal Factors relevant to plan of care: Lt THA in Jan 2018   Clinical Presentation Evolving   Clinical Presentation due to: significant pain without much relief   Clinical Decision Making Moderate   Rehab Potential Good   PT Frequency 2x / week   PT Duration 6 weeks   PT Treatment/Interventions ADLs/Self Care Home Management;Cryotherapy;Electrical Stimulation;Moist Heat;Traction;Ultrasound;Neuromuscular re-education;Therapeutic exercise;Therapeutic activities;Functional mobility training;Stair training;Gait training;Patient/family education;Manual techniques;Passive range of motion;Taping;Dry needling   PT Next Visit Plan review HEP and add core/hip stability exercises, ? try traction; possible TDN to hamstrings if needed   Consulted and Agree with Plan of Care Patient      Patient will benefit from skilled therapeutic intervention in order to improve the following deficits and impairments:  Abnormal gait, Pain, Decreased strength, Difficulty walking, Decreased mobility, Decreased range of motion, Impaired flexibility, Postural dysfunction, Increased fascial restricitons, Increased muscle spasms  Visit Diagnosis: Acute left-sided low back pain with left-sided  sciatica - Plan: PT plan of care cert/re-cert  Other abnormalities of gait and mobility - Plan: PT plan of care cert/re-cert     Problem List Patient Active Problem List   Diagnosis Date Noted  . Primary osteoarthritis of left hip 05/10/2016  . Status post total replacement of left hip 05/10/2016  .  Headache(784.0) 05/27/2013  . Prostate cancer ([place]) 06/29/2010  . Acute esophagitis 08/15/2007  . MIXED HYPERLIPIDEMIA 03/02/2007  . Allergic rhinitis 11/10/2006  . DEGENERATIVE DISC DISEASE, CERVICAL SPINE 11/10/2006  . DEGENERATIVE DISC DISEASE, LUMBAR SPINE 11/10/2006      Laureen Abrahams, PT, DPT 12/22/16 12:30 PM    [facility] [address] [place], [place], [address] Phone: [phone]   Fax:  [phone]  Name: KARDELL VIRGIL MRN: [id number] Date of Birth: November 27, 1955

## 2016-12-22 NOTE — Patient Instructions (Signed)
   Elbow Prop (Extension)   Prop body up on elbows for __3-5__ minutes. Slowly lower it. Repeat __1__ times. Do _2-3___ sessions per day.  Extension   Lie face down, hands close to chest. Press trunk up, arching back. Keep neck long, shoulders down. Tighten buttocks to protect lower back. Hold _1-2___ seconds. Repeat __10__ times. Do __2-3__ sessions per day.  Backward Bend (Standing)   Arch backward to make hollow of back deeper. Hold __1-2__ seconds. Repeat __10__ times per set. Do __1__ sets per session. Do __2-3__ sessions per day.  Copyright  VHI. All rights reserved.    Hamstring Step 2    Left foot relaxed, knee straight, other leg bent, foot flat. Raise straight leg further upward to maximal range. Hold _30_ seconds. Relax leg completely down. Repeat _2-3__ times.  Copyright  VHI. All rights reserved.

## 2016-12-23 NOTE — Telephone Encounter (Signed)
I called patient, patient will get MRI from place where MRI was performed.

## 2017-01-04 ENCOUNTER — Ambulatory Visit: Payer: 59 | Attending: Orthopaedic Surgery | Admitting: Physical Therapy

## 2017-01-04 DIAGNOSIS — M5442 Lumbago with sciatica, left side: Secondary | ICD-10-CM | POA: Insufficient documentation

## 2017-01-04 DIAGNOSIS — R2689 Other abnormalities of gait and mobility: Secondary | ICD-10-CM | POA: Insufficient documentation

## 2017-01-04 NOTE — Therapy (Signed)
[place], [place], [address] Phone: [phone]   Fax:  [phone]  Physical Therapy Treatment  Patient Details  Name: Daniel Booth MRN: [id number] Date of Birth: 19-May-1955 Referring Provider: Joni Fears, MD (see Sherley Bounds, MD (neurosurgery) Monday)  Encounter Date: 01/04/2017      PT End of Session - 01/04/17 1408    Visit Number 2   Number of Visits 12   Date for PT Re-Evaluation 02/02/17   Authorization Type UHC 60 visit limit   PT Start Time 1334   PT Stop Time 1425   PT Time Calculation (min) 51 min   Activity Tolerance Patient tolerated treatment well   Behavior During Therapy [facility] for tasks assessed/performed      Past Medical History:  Diagnosis Date  . Allergy   . Arthritis   . Complication of anesthesia 2011   "vitals dropped with endoscopy"  . Diverticulitis   . Diverticulosis   . GERD (gastroesophageal reflux disease)    on omeprazole  . History of Barrett's esophagus   . History of kidney stones    10-15 yrs ago  . Hyperlipidemia   . Prostate cancer [facility])     Past Surgical History:  Procedure Laterality Date  . COLONOSCOPY    . MOHS SURGERY Right    scalp  . PROSTATE SURGERY  2010  . SHOULDER ARTHROSCOPY  2007   right  . TOTAL HIP ARTHROPLASTY Left 05/10/2016   Procedure: TOTAL HIP ARTHROPLASTY;  Surgeon: Garald Balding, MD;  Location: [place];  Service: Orthopedics;  Laterality: Left;  . UPPER GASTROINTESTINAL ENDOSCOPY      There were no vitals filed for this visit.      Subjective Assessment - 01/04/17 1337    Subjective Pt saw the Neurosurgeon (Dr. Ronnald Ramp) last week. he is scheduled for micro-discectomy for 02/12/17.  He reports pain has been better since doing the extension exercises.  Has not played golf and tennis in 9 mos .     Currently in Pain? Yes   Pain Score 1    Pain Location Back   Pain Orientation Left;Lateral   Pain Descriptors /  Indicators Burning;Aching   Pain Type Chronic pain   Pain Onset More than a month ago   Pain Frequency Intermittent   Aggravating Factors  sitting, driving    Pain Relieving Factors stretching, heat sometimes    Effect of Pain on Daily Activities unable to play sports               [facility] Adult PT Treatment/Exercise - 01/04/17 0001      Self-Care   Other Self-Care Comments  tennis ball, piriformis stretching      Lumbar Exercises: Stretches   Active Hamstring Stretch 3 reps;30 seconds   Prone on Elbows Stretch 5 reps;60 seconds  5 min   Press Ups 5 reps  with centralization of symptoms   Piriformis Stretch 3 reps;30 seconds  pt limited due to L hip (ant ) pain, THR   Piriformis Stretch Limitations PROM L hip ER and IR <5 min by PT.      Lumbar Exercises: Aerobic   Stationary Bike NuStep increased burning anf aching in back and leg, < 5 min      Traction   Type of Traction Lumbar  prone   Min (lbs) 60   Max (lbs) 80   Hold Time 60   Rest Time 15   Time 15  PT Education - 01/04/17 1407    Education provided Yes   Education Details HEP reinforcement, extension based, traction, use of tennis ball on piriformis    Person(s) Educated Patient   Methods Explanation;Demonstration   Comprehension Verbalized understanding;Returned demonstration             PT Long Term Goals - 01/04/17 1351      PT LONG TERM GOAL #1   Title independent with HEP   Baseline initial HEP    Status Partially Met     PT LONG TERM GOAL #2   Title verbalize understanding of posture/body mechanics to decrease risk of reinjury    Status On-going     PT LONG TERM GOAL #3   Title perform lumbar ROM without increase in pain for improved function   Status On-going     PT LONG TERM GOAL #4   Title perform simulated golf activities without increase in pain for improved function   Status On-going     PT LONG TERM GOAL #5   Title report 75% improvement in pain  while driving for improved tolerance to work responsibilities   Status On-going               Plan - 01/04/17 1409    Clinical Impression Statement Improved pain since eval, has been doing HEP regularly.  Trial of traction (mechanical) to reduce radicular symptoms. Needs to see 1-2 PTS for consistency, will work on this with scheduling.    PT Next Visit Plan review HEP and add core/hip stability exercises, how was traction; possible TDN to hamstrings if needed   PT Home Exercise Plan prone press up, hamstring stretch, rolling with tennis ball    Consulted and Agree with Plan of Care Patient      Patient will benefit from skilled therapeutic intervention in order to improve the following deficits and impairments:  Abnormal gait, Pain, Decreased strength, Difficulty walking, Decreased mobility, Decreased range of motion, Impaired flexibility, Postural dysfunction, Increased fascial restricitons, Increased muscle spasms  Visit Diagnosis: Acute left-sided low back pain with left-sided sciatica  Other abnormalities of gait and mobility     Problem List Patient Active Problem List   Diagnosis Date Noted  . Primary osteoarthritis of left hip 05/10/2016  . Status post total replacement of left hip 05/10/2016  . Headache(784.0) 05/27/2013  . Prostate cancer ([place]) 06/29/2010  . Acute esophagitis 08/15/2007  . MIXED HYPERLIPIDEMIA 03/02/2007  . Allergic rhinitis 11/10/2006  . DEGENERATIVE DISC DISEASE, CERVICAL SPINE 11/10/2006  . DEGENERATIVE DISC DISEASE, LUMBAR SPINE 11/10/2006    Dominga Mcduffie 01/04/2017, 2:15 PM  [facility] [address] [place], [place], [address] Phone: [phone]   Fax:  [phone]  Name: DEANTE BLOUGH MRN: [id number] Date of Birth: 1956-03-31  Raeford Razor, PT 01/04/17 2:16 PM Phone: [phone] Fax: [phone]

## 2017-01-05 ENCOUNTER — Encounter: Payer: Self-pay | Admitting: Physical Therapy

## 2017-01-05 ENCOUNTER — Ambulatory Visit: Payer: 59 | Admitting: Physical Therapy

## 2017-01-05 ENCOUNTER — Ambulatory Visit: Payer: 59 | Admitting: Gastroenterology

## 2017-01-05 DIAGNOSIS — M5442 Lumbago with sciatica, left side: Secondary | ICD-10-CM

## 2017-01-05 DIAGNOSIS — R2689 Other abnormalities of gait and mobility: Secondary | ICD-10-CM

## 2017-01-05 NOTE — Therapy (Signed)
[place], [place], [address] Phone: [phone]   Fax:  [phone]  Physical Therapy Treatment  Patient Details  Name: Daniel Booth MRN: [id number] Date of Birth: 08/05/1955 Referring Provider: Joni Fears, MD (see Sherley Bounds, MD (neurosurgery) Monday)  Encounter Date: 01/05/2017      PT End of Session - 01/05/17 0827    Visit Number 3   Number of Visits 12   Date for PT Re-Evaluation 02/02/17   Authorization Type UHC 60 visit limit   PT Start Time 0806   PT Stop Time 0858   PT Time Calculation (min) 52 min   Activity Tolerance Patient tolerated treatment well   Behavior During Therapy [facility] for tasks assessed/performed      Past Medical History:  Diagnosis Date  . Allergy   . Arthritis   . Complication of anesthesia 2011   "vitals dropped with endoscopy"  . Diverticulitis   . Diverticulosis   . GERD (gastroesophageal reflux disease)    on omeprazole  . History of Barrett's esophagus   . History of kidney stones    10-15 yrs ago  . Hyperlipidemia   . Prostate cancer [facility])     Past Surgical History:  Procedure Laterality Date  . COLONOSCOPY    . MOHS SURGERY Right    scalp  . PROSTATE SURGERY  2010  . SHOULDER ARTHROSCOPY  2007   right  . TOTAL HIP ARTHROPLASTY Left 05/10/2016   Procedure: TOTAL HIP ARTHROPLASTY;  Surgeon: Garald Balding, MD;  Location: [place];  Service: Orthopedics;  Laterality: Left;  . UPPER GASTROINTESTINAL ENDOSCOPY      There were no vitals filed for this visit.      Subjective Assessment - 01/05/17 0806    Subjective "the traction made me alittle sore, and I had more soreness in the low back, and some soreness in the thigh"    Currently in Pain? Yes   Pain Score 1    Pain Location Back   Pain Orientation Left   Pain Descriptors / Indicators Aching                         [facility] Adult PT Treatment/Exercise - 01/05/17 0823      Lumbar Exercises: Stretches   Press Ups --  10 x with breathing out and belly sag     Modalities   Modalities Moist Heat     Moist Heat Therapy   Number Minutes Moist Heat 10 Minutes   Moist Heat Location Lumbar Spine  prone     Manual Therapy   Manual Therapy Soft tissue mobilization;Joint mobilization;Myofascial release   Manual therapy comments tack and stretch over L piriformis   Joint Mobilization grade 3 L1-L5 PA mobs   Soft tissue mobilization IASTM techniques over bil lumbar paraspinals   Myofascial Release stretching/ rolling over bil lumbar parspinals          Trigger Point Dry Needling - 01/05/17 0824    Consent Given? Yes   Education Handout Provided Yes   Muscles Treated Upper Body Longissimus   Muscles Treated Lower Body Piriformis   Longissimus Response Twitch response elicited;Palpable increased muscle length   Piriformis Response Twitch response elicited;Palpable increased muscle length              PT Education - 01/05/17 0827    Education provided Yes   Education Details muscle anatomy and referral patterns. What TPDN is,  benefits, what to expect and after care.    Person(s) Educated Patient   Methods Explanation;Verbal cues   Comprehension Verbalized understanding;Verbal cues required             PT Long Term Goals - 01/04/17 1351      PT LONG TERM GOAL #1   Title independent with HEP   Baseline initial HEP    Status Partially Met     PT LONG TERM GOAL #2   Title verbalize understanding of posture/body mechanics to decrease risk of reinjury    Status On-going     PT LONG TERM GOAL #3   Title perform lumbar ROM without increase in pain for improved function   Status On-going     PT LONG TERM GOAL #4   Title perform simulated golf activities without increase in pain for improved function   Status On-going     PT LONG TERM GOAL #5   Title report 75% improvement in pain while driving for improved tolerance to work  responsibilities   Status On-going               Plan - 01/05/17 0827    Clinical Impression Statement pt reports improvement in pain and soreness since last session. Educated and performed TPDN over bil lumbar paraspinals and L piriformis. Soft tissue work and lumbar mobs performed. continued extension biased progressing with prone press up with sag. post session he reported relief of pain and tightness. utilized MHP for low back soreness from DN.    PT Next Visit Plan assess response to DN, review HEP and add core/hip stability exercises, how was traction; possible TDN to hamstrings if needed   PT Home Exercise Plan prone press up, hamstring stretch, rolling with tennis ball, prone press up with sag   Consulted and Agree with Plan of Care Patient      Patient will benefit from skilled therapeutic intervention in order to improve the following deficits and impairments:  Abnormal gait, Pain, Decreased strength, Difficulty walking, Decreased mobility, Decreased range of motion, Impaired flexibility, Postural dysfunction, Increased fascial restricitons, Increased muscle spasms  Visit Diagnosis: Acute left-sided low back pain with left-sided sciatica  Other abnormalities of gait and mobility     Problem List Patient Active Problem List   Diagnosis Date Noted  . Primary osteoarthritis of left hip 05/10/2016  . Status post total replacement of left hip 05/10/2016  . Headache(784.0) 05/27/2013  . Prostate cancer ([place]) 06/29/2010  . Acute esophagitis 08/15/2007  . MIXED HYPERLIPIDEMIA 03/02/2007  . Allergic rhinitis 11/10/2006  . DEGENERATIVE DISC DISEASE, CERVICAL SPINE 11/10/2006  . DEGENERATIVE DISC DISEASE, LUMBAR SPINE 11/10/2006   Starr [place] PT, DPT, LAT, ATC  01/05/17  8:48 AM      [facility] [address] [place], [place], [address] Phone: [phone]   Fax:  [phone]  Name: Daniel Booth MRN:  [id number] Date of Birth: [birth year]

## 2017-01-09 ENCOUNTER — Ambulatory Visit: Payer: 59

## 2017-01-11 ENCOUNTER — Ambulatory Visit: Payer: 59 | Admitting: Physical Therapy

## 2017-01-11 DIAGNOSIS — R2689 Other abnormalities of gait and mobility: Secondary | ICD-10-CM

## 2017-01-11 DIAGNOSIS — M5442 Lumbago with sciatica, left side: Secondary | ICD-10-CM

## 2017-01-11 NOTE — Therapy (Signed)
Daniel Booth, [place], [address] Phone: [phone]   Fax:  [phone]  Physical Therapy Treatment  Patient Details  Name: Daniel Booth MRN: [id number] Date of Birth: 09/26/1955 Referring Provider: Joni Fears, MD (see Sherley Bounds, MD (neurosurgery) Monday)  Encounter Date: 01/11/2017      PT End of Session - 01/11/17 0921    Visit Number 4   Number of Visits 12   Date for PT Re-Evaluation 02/02/17   Authorization Type UHC 60 visit limit   PT Start Time 0818   PT Stop Time 0900   PT Time Calculation (min) 42 min   Activity Tolerance Patient tolerated treatment well   Behavior During Therapy [facility] for tasks assessed/performed      Past Medical History:  Diagnosis Date  . Allergy   . Arthritis   . Complication of anesthesia 2011   "vitals dropped with endoscopy"  . Diverticulitis   . Diverticulosis   . GERD (gastroesophageal reflux disease)    on omeprazole  . History of Barrett's esophagus   . History of kidney stones    10-15 yrs ago  . Hyperlipidemia   . Prostate cancer [facility])     Past Surgical History:  Procedure Laterality Date  . COLONOSCOPY    . MOHS SURGERY Right    scalp  . PROSTATE SURGERY  2010  . SHOULDER ARTHROSCOPY  2007   right  . TOTAL HIP ARTHROPLASTY Left 05/10/2016   Procedure: TOTAL HIP ARTHROPLASTY;  Surgeon: Garald Balding, MD;  Location: [place];  Service: Orthopedics;  Laterality: Left;  . UPPER GASTROINTESTINAL ENDOSCOPY      There were no vitals filed for this visit.      Subjective Assessment - 01/11/17 0810    Subjective really thinks therapy is helping.  having some stiffness and pain from driving this week ([place] on Monday; [place] yesterday) but it's not as bad as it was before. hasn't tried to play golf or tennis.   Patient Stated Goals improve pain, return to golf/tennis   Currently in Pain? Yes   Pain Score 2    Pain Location Back   Pain  Orientation Left   Pain Descriptors / Indicators Aching   Pain Type Chronic pain   Pain Onset More than a month ago   Pain Frequency Intermittent   Aggravating Factors  sitting, driving   Pain Relieving Factors stretching, heat sometimes                         [facility] Adult PT Treatment/Exercise - 01/11/17 0845      Lumbar Exercises: Stretches   Passive Hamstring Stretch 3 reps;30 seconds   Passive Hamstring Stretch Limitations seated     Moist Heat Therapy   Number Minutes Moist Heat 10 Minutes   Moist Heat Location --  hamstring Lt     Traction   Type of Traction Lumbar   Min (lbs) 60   Max (lbs) 80   Hold Time 60   Rest Time 15   Time 15      Manual Therapy   Manual Therapy Soft tissue mobilization;Myofascial release   Soft tissue mobilization Lt hamstring   Myofascial Release Lt hamstring          Trigger Point Dry Needling - 01/11/17 0846    Consent Given? Yes   Education Handout Provided Yes   Muscles Treated Lower Body Hamstring   Hamstring Response Twitch  response elicited;Palpable increased muscle length              PT Education - 01/11/17 0921    Education provided Yes   Education Details hamstring stretch   Person(s) Educated Patient   Methods Explanation;Demonstration;Handout   Comprehension Verbalized understanding;Returned demonstration;Need further instruction             PT Long Term Goals - 01/04/17 1351      PT LONG TERM GOAL #1   Title independent with HEP   Baseline initial HEP    Status Partially Met     PT LONG TERM GOAL #2   Title verbalize understanding of posture/body mechanics to decrease risk of reinjury    Status On-going     PT LONG TERM GOAL #3   Title perform lumbar ROM without increase in pain for improved function   Status On-going     PT LONG TERM GOAL #4   Title perform simulated golf activities without increase in pain for improved function   Status On-going     PT LONG TERM GOAL  #5   Title report 75% improvement in pain while driving for improved tolerance to work responsibilities   Status On-going               Plan - 01/11/17 0921    Clinical Impression Statement Pt reports increased soreness after 2 long driving trips this past week, but does report it is less than previous trips.  Pt requested DN to hamstring today with good twitch response noted and decreased tightness following STM and myofascial release.  Pt also stating that he feels traction was beneficial last time so requested again.  Progressing well towards goals.   PT Treatment/Interventions ADLs/Self Care Home Management;Cryotherapy;Electrical Stimulation;Moist Heat;Traction;Ultrasound;Neuromuscular re-education;Therapeutic exercise;Therapeutic activities;Functional mobility training;Stair training;Gait training;Patient/family education;Manual techniques;Passive range of motion;Taping;Dry needling   PT Next Visit Plan assess response to DN, review HEP and add core/hip stability exercises, how was traction; cont extension based exercises   Consulted and Agree with Plan of Care Patient      Patient will benefit from skilled therapeutic intervention in order to improve the following deficits and impairments:  Abnormal gait, Pain, Decreased strength, Difficulty walking, Decreased mobility, Decreased range of motion, Impaired flexibility, Postural dysfunction, Increased fascial restricitons, Increased muscle spasms  Visit Diagnosis: Acute left-sided low back pain with left-sided sciatica  Other abnormalities of gait and mobility     Problem List Patient Active Problem List   Diagnosis Date Noted  . Primary osteoarthritis of left hip 05/10/2016  . Status post total replacement of left hip 05/10/2016  . Headache(784.0) 05/27/2013  . Prostate cancer ([place]) 06/29/2010  . Acute esophagitis 08/15/2007  . MIXED HYPERLIPIDEMIA 03/02/2007  . Allergic rhinitis 11/10/2006  . DEGENERATIVE DISC DISEASE,  CERVICAL SPINE 11/10/2006  . [place] DISEASE, LUMBAR SPINE 11/10/2006      Laureen Abrahams, PT, DPT 01/11/17 9:24 AM    [facility] [address] [place], [place], [address] Phone: [phone]   Fax:  [phone]  Name: Daniel Booth MRN: [id number] Date of Birth: Jan 09, 1956

## 2017-01-11 NOTE — Patient Instructions (Signed)
HIP: Hamstrings - Short Sitting    Rest leg on raised surface. Keep knee straight. Lift chest. Hold 30 seconds. _3__ reps per set, __2-3_ sets per day, _7__ days per week  Copyright  VHI. All rights reserved.   

## 2017-01-16 ENCOUNTER — Ambulatory Visit: Payer: 59 | Admitting: Physical Therapy

## 2017-01-16 DIAGNOSIS — R2689 Other abnormalities of gait and mobility: Secondary | ICD-10-CM

## 2017-01-16 DIAGNOSIS — M5442 Lumbago with sciatica, left side: Secondary | ICD-10-CM | POA: Diagnosis not present

## 2017-01-16 NOTE — Therapy (Signed)
[place], [place], [address] Phone: [phone]   Fax:  [phone]  Physical Therapy Treatment  Patient Details  Name: Daniel Booth MRN: [id number] Date of Birth: October 25, 1955 Referring Provider: Joni Fears, MD (see Sherley Bounds, MD (neurosurgery) Monday)  Encounter Date: 01/16/2017      PT End of Session - 01/16/17 0917    Visit Number 5   Number of Visits 12   Date for PT Re-Evaluation 02/02/17   Authorization Type UHC 60 visit limit   PT Start Time 0806   PT Stop Time 0845   PT Time Calculation (min) 39 min   Activity Tolerance Patient tolerated treatment well   Behavior During Therapy [facility] for tasks assessed/performed      Past Medical History:  Diagnosis Date  . Allergy   . Arthritis   . Complication of anesthesia 2011   "vitals dropped with endoscopy"  . Diverticulitis   . Diverticulosis   . GERD (gastroesophageal reflux disease)    on omeprazole  . History of Barrett's esophagus   . History of kidney stones    10-15 yrs ago  . Hyperlipidemia   . Prostate cancer [facility])     Past Surgical History:  Procedure Laterality Date  . COLONOSCOPY    . MOHS SURGERY Right    scalp  . PROSTATE SURGERY  2010  . SHOULDER ARTHROSCOPY  2007   right  . TOTAL HIP ARTHROPLASTY Left 05/10/2016   Procedure: TOTAL HIP ARTHROPLASTY;  Surgeon: Garald Balding, MD;  Location: [place];  Service: Orthopedics;  Laterality: Left;  . UPPER GASTROINTESTINAL ENDOSCOPY      There were no vitals filed for this visit.      Subjective Assessment - 01/16/17 0913    Subjective Patient feels sore today. He is having pain down his posterior lateral thigh. He felt sore with both needling and traction in the same day. he would preffer neddling today. He has been working on his exercises at home.    Limitations House hold activities;Sitting   How long can you sit comfortably? couple hours-has to shift and  reposition   Diagnostic tests MRI: protruding L5 disc   Patient Stated Goals improve pain, return to golf/tennis   Currently in Pain? Yes   Pain Score 3    Pain Location Back   Pain Orientation Left   Pain Descriptors / Indicators Aching   Pain Onset More than a month ago   Pain Frequency Intermittent   Aggravating Factors  sitting and driving    Pain Relieving Factors stretching and heat    Effect of Pain on Daily Activities unable to golf                          [facility] Adult PT Treatment/Exercise - 01/16/17 0001      Moist Heat Therapy   Number Minutes Moist Heat 10 Minutes   Moist Heat Location Lumbar Spine  prone     Manual Therapy   Manual Therapy Soft tissue mobilization;Myofascial release   Joint Mobilization grade 3 L1-L5 PA mobs   Soft tissue mobilization IASTM techniques over bil lumbar paraspinals   Myofascial Release stretching/ rolling over bil lumbar parspinals and hamstring                 PT Education - 01/16/17 0917    Education provided Yes   Education Details reviewed techniques to reduce post needle soreness  Person(s) Educated Patient   Methods Explanation;Demonstration;Tactile cues;Handout   Comprehension Verbalized understanding;Returned demonstration;Verbal cues required;Tactile cues required             PT Long Term Goals - 01/16/17 1610      PT LONG TERM GOAL #1   Title independent with HEP   Baseline initial HEP    Time 6   Period Weeks   Status Partially Met     PT LONG TERM GOAL #2   Title verbalize understanding of posture/body mechanics to decrease risk of reinjury    Time 6   Period Weeks   Status On-going     PT LONG TERM GOAL #3   Title perform lumbar ROM without increase in pain for improved function   Time 6   Period Weeks   Status On-going     PT LONG TERM GOAL #4   Title perform simulated golf activities without increase in pain for improved function   Time 6   Period Weeks   Status  On-going     PT LONG TERM GOAL #5   Title report 75% improvement in pain while driving for improved tolerance to work responsibilities   Baseline continues to have pain into the posterior thigh   Time 6   Period Weeks   Status On-going               Plan - 01/16/17 1607    Clinical Impression Statement TPDN to glut emdius. Good twitch respose ellictied with 2/3 needles. Therapy reviewed soft tissue mobilization to reduce post needle soreness. Patient had pain into his hip but after treatment pain was centralized around L3.-L4. Patient reported centralization with prone press ups. Patient was 6 mins late then had TPDN.    Clinical Presentation Evolving   Clinical Decision Making Moderate   Rehab Potential Good   PT Frequency 2x / week   PT Duration 6 weeks   PT Treatment/Interventions ADLs/Self Care Home Management;Cryotherapy;Electrical Stimulation;Moist Heat;Traction;Ultrasound;Neuromuscular re-education;Therapeutic exercise;Therapeutic activities;Functional mobility training;Stair training;Gait training;Patient/family education;Manual techniques;Passive range of motion;Taping;Dry needling   PT Next Visit Plan assess response to DN, review HEP and add core/hip stability exercises, how was traction; cont extension based exercises   PT Home Exercise Plan prone press up, hamstring stretch, rolling with tennis ball, prone press up with sag   Consulted and Agree with Plan of Care Patient      Patient will benefit from skilled therapeutic intervention in order to improve the following deficits and impairments:  Abnormal gait, Pain, Decreased strength, Difficulty walking, Decreased mobility, Decreased range of motion, Impaired flexibility, Postural dysfunction, Increased fascial restricitons, Increased muscle spasms  Visit Diagnosis: Acute left-sided low back pain with left-sided sciatica  Other abnormalities of gait and mobility     Problem List Patient Active Problem List    Diagnosis Date Noted  . Primary osteoarthritis of left hip 05/10/2016  . Status post total replacement of left hip 05/10/2016  . Headache(784.0) 05/27/2013  . Prostate cancer ([place]) 06/29/2010  . Acute esophagitis 08/15/2007  . MIXED HYPERLIPIDEMIA 03/02/2007  . Allergic rhinitis 11/10/2006  . DEGENERATIVE DISC DISEASE, CERVICAL SPINE 11/10/2006  . DEGENERATIVE DISC DISEASE, LUMBAR SPINE 11/10/2006    Carney Living  PT DPT  01/16/2017, 4:13 PM  [facility] [address] [place], [place], [address] Phone: [phone]   Fax:  [phone]  Name: Daniel Booth MRN: [id number] Date of Birth: May 10, 1955

## 2017-01-17 ENCOUNTER — Encounter: Payer: Self-pay | Admitting: Physician Assistant

## 2017-01-17 ENCOUNTER — Ambulatory Visit (INDEPENDENT_AMBULATORY_CARE_PROVIDER_SITE_OTHER): Payer: 59 | Admitting: Physician Assistant

## 2017-01-17 VITALS — BP 114/76 | HR 64 | Ht 70.0 in | Wt 200.0 lb

## 2017-01-17 DIAGNOSIS — F458 Other somatoform disorders: Secondary | ICD-10-CM

## 2017-01-17 DIAGNOSIS — R0989 Other specified symptoms and signs involving the circulatory and respiratory systems: Secondary | ICD-10-CM

## 2017-01-17 NOTE — Progress Notes (Signed)
Chief Complaint: Globus sensation  HPI:  [name] is a 61 year old Caucasian male with a past medical history of reflux and others listed below, who presents to clinic today accompanied by his wife with a complaint of a globus sensation.   Patient regularly follows with Dr. Havery Moros. His last EGD was performed 03/09/15 due to history of Barrett's esophagus with findings of suspected benign fundic gland polyps and an otherwise a normal exam. His prior two procedures also did not show Barrett's and he was taken off of a surveillance schedule.   Today, the patient explains that he started with a feeling as though there was "a catch" in his throat around Labor Day weekend.  He explains that when he would talk, it felt like it would get "hung" and then let go after "hacking a little". Around the same time patient describes an ulcer on the roof of his mouth identified by his dentist. Patient does show me pictures of this. This is no longer there and the patient has not had any further problems with "this catch". Patient denies food or liquids ever getting hung in his throat. He wants to make sure that "nothing is going on".   Patient denies fever, chills, blood in the stool, melena, weight loss, fatigue, anorexia, nausea, vomiting, heartburn, reflux or symptoms that awaken him at night.  Past Medical History:  Diagnosis Date  . Allergy   . Arthritis   . Complication of anesthesia 2011   "vitals dropped with endoscopy"  . Diverticulitis   . Diverticulosis   . GERD (gastroesophageal reflux disease)    on omeprazole  . History of Barrett's esophagus   . History of kidney stones    10-15 yrs ago  . Hyperlipidemia   . Prostate cancer [facility])     Past Surgical History:  Procedure Laterality Date  . COLONOSCOPY    . MOHS SURGERY Right    scalp  . PROSTATE SURGERY  2010  . SHOULDER ARTHROSCOPY  2007   right  . TOTAL HIP ARTHROPLASTY Left 05/10/2016   Procedure: TOTAL HIP ARTHROPLASTY;   Surgeon: Garald Balding, MD;  Location: [place];  Service: Orthopedics;  Laterality: Left;  . UPPER GASTROINTESTINAL ENDOSCOPY      Current Outpatient Prescriptions  Medication Sig Dispense Refill  . acetaminophen (TYLENOL) 500 MG tablet Take 1-2 tablets (500-1,000 mg total) by mouth every 6 (six) hours as needed. 30 tablet 0  . aspirin 81 MG tablet Take 81 mg by mouth daily.    . Calcium-Magnesium (CAL-MAG PO) Take 1 tablet by mouth daily.    . Coenzyme Q10 (CO Q10) 60 MG CAPS Take 1 tablet by mouth daily.    . fluticasone (FLONASE) 50 MCG/ACT nasal spray USE AS DIRECTED 32 g 6  . loratadine (CLARITIN) 10 MG tablet Take 10 mg by mouth daily.      . Multiple Vitamin (MULTIVITAMIN) tablet Take 1 tablet by mouth daily.    . Omega-3 1000 MG CAPS Take 1 capsule by mouth daily.    Marland Kitchen omeprazole (PRILOSEC) 20 MG capsule Take 1 capsule (20 mg total) by mouth daily. 90 capsule 3  . simvastatin (ZOCOR) 20 MG tablet Take 1 tablet (20 mg total) by mouth at bedtime. 90 tablet 3  . tadalafil (CIALIS) 5 MG tablet Take 1 tablet (5 mg total) by mouth daily. as directed 30 tablet 6   No current facility-administered medications for this visit.     Allergies as of 01/17/2017 - Review Complete  01/11/2017  Allergen Reaction Noted  . No known allergies  05/09/2016    Family History  Problem Relation Age of Onset  . Colon polyps Father   . Diabetes Father   . Melanoma Mother   . Melanoma Maternal Grandmother   . Colon cancer Neg Hx   . Esophageal cancer Neg Hx   . Rectal cancer Neg Hx   . Stomach cancer Neg Hx     Social History   Social History  . Marital status: Married    Spouse name: N/A  . Number of children: N/A  . Years of education: N/A   Occupational History  . Not on file.   Social History Main Topics  . Smoking status: Former Smoker    Packs/day: 0.25    Types: Cigarettes    Quit date: 04/27/1982  . Smokeless tobacco: Never Used  . Alcohol use 2.4 oz/week    4 Glasses of  wine per week  . Drug use: No  . Sexual activity: Not on file   Other Topics Concern  . 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 Eyes:   PEERL, EOMI. No icterus. Conjunctiva pink. Ears:  Normal auditory acuity. Neck:  Supple Throat: Oral cavity and pharynx without inflammation, swelling or lesion.  Respiratory: Respirations even and unlabored. Lungs clear to auscultation bilaterally.   No wheezes, crackles, or rhonchi.  Cardiovascular: Normal S1, S2. No MRG. Regular rate and rhythm. No peripheral edema, cyanosis or pallor.  Gastrointestinal:  Soft, nondistended, nontender. No rebound or guarding. Normal bowel sounds. No appreciable masses or hepatomegaly. Psychiatric: Demonstrates good judgement and reason without abnormal affect or behaviors.  MOST RECENT LABS AND IMAGING: CBC    Component Value Date/Time   WBC 5.2 12/07/2016 0903   RBC 4.46 12/07/2016 0903   HGB 13.8 12/07/2016 0903   HCT 41.2 12/07/2016 0903   PLT 307.0 12/07/2016 0903   MCV 92.4 12/07/2016 0903   MCH 29.9 05/12/2016 0532   MCHC 33.6 12/07/2016 0903   RDW 13.7 12/07/2016 0903   LYMPHSABS 1.1 12/07/2016 0903   MONOABS 0.4 12/07/2016 0903   EOSABS 0.1 12/07/2016 0903   BASOSABS 0.0 12/07/2016 0903    CMP     Component Value Date/Time   NA 140 12/07/2016 0903   K 4.5 12/07/2016 0903   CL 103 12/07/2016 0903   CO2 33 (H) 12/07/2016 0903   GLUCOSE 106 (H) 12/07/2016  0903   BUN 14 12/07/2016 0903   CREATININE 0.93 12/07/2016 0903   CALCIUM 9.7 12/07/2016 0903   PROT 6.8 12/07/2016 0903   ALBUMIN 4.6 12/07/2016 0903   AST 24 12/07/2016 0903   ALT 28 12/07/2016 0903   ALKPHOS 84 12/07/2016 0903   BILITOT 0.5 12/07/2016 0903   GFRNONAA >60 05/12/2016 0532   GFRAA >60 05/12/2016 0532  Assessment: 1. Globus sensation: Felt like a "catch" in patients throat around Labor Day, has gotten better, patient was on vacation at the beach at this time; question relation to possible increase in reflux due to diet at that time versus mucus drainage versus stricture ring vs web  Plan: 1. Ordered a barium esophagram with tablet for the patient. If this is normal there is no need for further evaluation. If this shows structural abnormality will proceed with EGD. 2. Discussed above with the patient and his wife and they are in agreement. 3. Patient should continue his Omeprazole as prescribed. 4. Patient to return to clinic with Dr. Havery Moros as needed in the future.  Ellouise Newer, PA-C [facility] 01/17/2017, 10:37 AM  Cc: Dorothyann Peng, NP

## 2017-01-17 NOTE — Patient Instructions (Addendum)
Continue taking your Omeprazole.   You have been scheduled for a Barium Esophogram at [facility] (1st floor of the hospital) on 01-19-17 at 9:15 am. Please arrive 15 minutes prior to your appointment for registration. Make certain not to have anything to eat or drink 3 hours prior to your test. If you need to reschedule for any reason, please contact radiology at [phone] to do so. __________________________________________________________________ A barium swallow is an examination that concentrates on views of the esophagus. This tends to be a double contrast exam (barium and two liquids which, when combined, create a gas to distend the wall of the oesophagus) or single contrast (non-ionic iodine based). The study is usually tailored to your symptoms so a good history is essential. Attention is paid during the study to the form, structure and configuration of the esophagus, looking for functional disorders (such as aspiration, dysphagia, achalasia, motility and reflux) EXAMINATION You may be asked to change into a gown, depending on the type of swallow being performed. A radiologist and radiographer will perform the procedure. The radiologist will advise you of the type of contrast selected for your procedure and direct you during the exam. You will be asked to stand, sit or lie in several different positions and to hold a small amount of fluid in your mouth before being asked to swallow while the imaging is performed .In some instances you may be asked to swallow barium coated marshmallows to assess the motility of a solid food bolus. The exam can be recorded as a digital or video fluoroscopy procedure. POST PROCEDURE It will take 1-2 days for the barium to pass through your system. To facilitate this, it is important, unless otherwise directed, to increase your fluids for the next 24-48hrs and to resume your normal diet.  This test typically takes about 30 minutes to  perform. __________________________________________________________________________________

## 2017-01-17 NOTE — Progress Notes (Signed)
Agree with assessment and plan. Doubt any significant pathology will be shown, hopefully normal study will provide reassurance to the patient

## 2017-01-18 ENCOUNTER — Ambulatory Visit: Payer: 59 | Admitting: Physical Therapy

## 2017-01-18 DIAGNOSIS — R2689 Other abnormalities of gait and mobility: Secondary | ICD-10-CM

## 2017-01-18 DIAGNOSIS — M5442 Lumbago with sciatica, left side: Secondary | ICD-10-CM

## 2017-01-18 NOTE — Therapy (Signed)
Daniel Booth, [place], [address] Phone: [phone]   Fax:  [phone]  Physical Therapy Treatment  Patient Details  Name: Daniel Booth MRN: [id number] Date of Birth: Jun 21, 1955 Referring Provider: Joni Fears, MD (see Sherley Bounds, MD (neurosurgery) Monday)  Encounter Date: 01/18/2017      PT End of Session - 01/18/17 0840    Visit Number 6   Number of Visits 12   Date for PT Re-Evaluation 02/02/17   Authorization Type UHC 60 visit limit   PT Start Time 0805   PT Stop Time 8850   PT Time Calculation (min) 52 min   Activity Tolerance Patient tolerated treatment well   Behavior During Therapy [facility] for tasks assessed/performed      Past Medical History:  Diagnosis Date  . Allergy   . Arthritis   . Complication of anesthesia 2011   "vitals dropped with endoscopy"  . Diverticulitis   . Diverticulosis   . GERD (gastroesophageal reflux disease)    on omeprazole  . History of Barrett's esophagus   . History of kidney stones    10-15 yrs ago  . Hyperlipidemia   . Prostate cancer [facility])     Past Surgical History:  Procedure Laterality Date  . COLONOSCOPY    . MOHS SURGERY Right    scalp  . PROSTATE SURGERY  2010  . SHOULDER ARTHROSCOPY  2007   right  . TOTAL HIP ARTHROPLASTY Left 05/10/2016   Procedure: TOTAL HIP ARTHROPLASTY;  Surgeon: Garald Balding, MD;  Location: [place];  Service: Orthopedics;  Laterality: Left;  . UPPER GASTROINTESTINAL ENDOSCOPY      There were no vitals filed for this visit.      Subjective Assessment - 01/18/17 0806    Subjective has sx scheduled for 02/08/17.  getting short term benefit but symptoms return quickly.  overall only improved 20%; decreased burning sensation.  still having tightness in gluts/piriformis   Diagnostic tests MRI: protruding L5 disc   Patient Stated Goals improve pain, return to golf/tennis   Currently in Pain? Yes   Pain Score 3    Pain Location Back   Pain Orientation Left   Pain Descriptors / Indicators Aching   Pain Type Chronic pain   Pain Onset More than a month ago   Pain Frequency Intermittent   Aggravating Factors  sitting and driving   Pain Relieving Factors stretch and heat                         [facility] Adult PT Treatment/Exercise - 01/18/17 0001      Lumbar Exercises: Stretches   Prone on Elbows Stretch 5 reps;60 seconds   Prone on Elbows Stretch Limitations continuous   Press Ups Limitations 10 reps with centralization   Piriformis Stretch 3 reps;30 seconds   Piriformis Stretch Limitations seated; knee towards opposite shoulder; pt very tight     Lumbar Exercises: Standing   Other Standing Lumbar Exercises extension x 20 reps     Traction   Type of Traction Lumbar  in prone   Min (lbs) 65   Max (lbs) 85   Hold Time 60   Rest Time 20   Time 15           Trigger Point Dry Needling - 01/18/17 0744    Consent Given? Yes   Education Handout Provided Yes   Muscles Treated Lower Body --  Glut medius good twitch  respose               PT Education - 01/18/17 0840    Education provided Yes   Education Details hip flexor stretch   Person(s) Educated Patient   Methods Explanation;Demonstration;Handout   Comprehension Verbalized understanding;Returned demonstration;Need further instruction             PT Long Term Goals - 01/16/17 1610      PT LONG TERM GOAL #1   Title independent with HEP   Baseline initial HEP    Time 6   Period Weeks   Status Partially Met     PT LONG TERM GOAL #2   Title verbalize understanding of posture/body mechanics to decrease risk of reinjury    Time 6   Period Weeks   Status On-going     PT LONG TERM GOAL #3   Title perform lumbar ROM without increase in pain for improved function   Time 6   Period Weeks   Status On-going     PT LONG TERM GOAL #4   Title perform simulated golf activities without increase in pain for  improved function   Time 6   Period Weeks   Status On-going     PT LONG TERM GOAL #5   Title report 75% improvement in pain while driving for improved tolerance to work responsibilities   Baseline continues to have pain into the posterior thigh   Time 6   Period Weeks   Status On-going               Plan - 01/18/17 0840    Clinical Impression Statement Pt reports long term benefit of only 20% at this time, and has good results during sessions but pain and radicular symptoms return shortly after.  Continues to feel traction is beneficial so performed again today.  Will continue to benefit from PT to decrease pain and improve function.   PT Treatment/Interventions ADLs/Self Care Home Management;Cryotherapy;Electrical Stimulation;Moist Heat;Traction;Ultrasound;Neuromuscular re-education;Therapeutic exercise;Therapeutic activities;Functional mobility training;Stair training;Gait training;Patient/family education;Manual techniques;Passive range of motion;Taping;Dry needling   PT Next Visit Plan assess response to DN, review HEP and add core/hip stability exercises, how was traction; cont extension based exercises   Consulted and Agree with Plan of Care Patient      Patient will benefit from skilled therapeutic intervention in order to improve the following deficits and impairments:  Abnormal gait, Pain, Decreased strength, Difficulty walking, Decreased mobility, Decreased range of motion, Impaired flexibility, Postural dysfunction, Increased fascial restricitons, Increased muscle spasms  Visit Diagnosis: Acute left-sided low back pain with left-sided sciatica  Other abnormalities of gait and mobility     Problem List Patient Active Problem List   Diagnosis Date Noted  . Primary osteoarthritis of left hip 05/10/2016  . Status post total replacement of left hip 05/10/2016  . Headache(784.0) 05/27/2013  . Prostate cancer ([place]) 06/29/2010  . Acute esophagitis 08/15/2007  . MIXED  HYPERLIPIDEMIA 03/02/2007  . Allergic rhinitis 11/10/2006  . DEGENERATIVE DISC DISEASE, CERVICAL SPINE 11/10/2006  . DEGENERATIVE DISC DISEASE, LUMBAR SPINE 11/10/2006      Laureen Abrahams, PT, DPT 01/18/17 9:02 AM    [facility] [address] [place], [place], [address] Phone: [phone]   Fax:  [phone]  Name: JIOVANI MCCAMMON MRN: [id number] Date of Birth: 07-15-1955

## 2017-01-18 NOTE — Patient Instructions (Signed)
HIP: Short Hip Flexors - Standing    Place hand on wall for support. Place one leg in front of other. Bend both knees. Lift chest and rotate pelvis forward. Hold _30__ seconds. __2-3_ reps per set, __2-3_ sets per day, _6-7__ days per week  Copyright  VHI. All rights reserved.

## 2017-01-19 ENCOUNTER — Ambulatory Visit (HOSPITAL_COMMUNITY)
Admission: RE | Admit: 2017-01-19 | Discharge: 2017-01-19 | Disposition: A | Discharge status: Home / Self Care | Payer: 59 | Source: Ambulatory Visit | Attending: Physician Assistant | Admitting: Physician Assistant

## 2017-01-19 DIAGNOSIS — K449 Diaphragmatic hernia without obstruction or gangrene: Secondary | ICD-10-CM | POA: Insufficient documentation

## 2017-01-19 DIAGNOSIS — K219 Gastro-esophageal reflux disease without esophagitis: Secondary | ICD-10-CM | POA: Diagnosis not present

## 2017-01-19 DIAGNOSIS — F458 Other somatoform disorders: Secondary | ICD-10-CM | POA: Insufficient documentation

## 2017-01-19 DIAGNOSIS — R0989 Other specified symptoms and signs involving the circulatory and respiratory systems: Secondary | ICD-10-CM

## 2017-01-24 ENCOUNTER — Encounter: Payer: Self-pay | Admitting: Physical Therapy

## 2017-01-24 ENCOUNTER — Ambulatory Visit: Payer: 59 | Admitting: Physical Therapy

## 2017-01-24 DIAGNOSIS — R2689 Other abnormalities of gait and mobility: Secondary | ICD-10-CM

## 2017-01-24 DIAGNOSIS — M5442 Lumbago with sciatica, left side: Secondary | ICD-10-CM

## 2017-01-24 NOTE — Therapy (Signed)
[place], [place], [address] Phone: [phone]   Fax:  [phone]  Physical Therapy Treatment  Patient Details  Name: Daniel Booth MRN: [id number] Date of Birth: 1955/07/04 Referring Provider: Joni Fears, MD (see Sherley Bounds, MD (neurosurgery) Monday)  Encounter Date: 01/24/2017      PT End of Session - 01/24/17 0845    Visit Number 7   Number of Visits 12   Date for PT Re-Evaluation 02/02/17   Authorization Type UHC 60 visit limit   PT Start Time 0803   PT Stop Time 0915   PT Time Calculation (min) 72 min   Activity Tolerance Patient tolerated treatment well   Behavior During Therapy [facility] for tasks assessed/performed      Past Medical History:  Diagnosis Date  . Allergy   . Arthritis   . Complication of anesthesia 2011   "vitals dropped with endoscopy"  . Diverticulitis   . Diverticulosis   . GERD (gastroesophageal reflux disease)    on omeprazole  . History of Barrett's esophagus   . History of kidney stones    10-15 yrs ago  . Hyperlipidemia   . Prostate cancer [facility])     Past Surgical History:  Procedure Laterality Date  . COLONOSCOPY    . MOHS SURGERY Right    scalp  . PROSTATE SURGERY  2010  . SHOULDER ARTHROSCOPY  2007   right  . TOTAL HIP ARTHROPLASTY Left 05/10/2016   Procedure: TOTAL HIP ARTHROPLASTY;  Surgeon: Garald Balding, MD;  Location: [place];  Service: Orthopedics;  Laterality: Left;  . UPPER GASTROINTESTINAL ENDOSCOPY      There were no vitals filed for this visit.      Subjective Assessment - 01/24/17 0809    Subjective Patient reports intermittent burning and pain into LLE.  Has good days and bad.  Does his exercises and needs no cueing.     Currently in Pain? Yes   Pain Score 4    Pain Location Back   Pain Orientation Left   Pain Descriptors / Indicators Burning   Pain Type Chronic pain   Pain Onset More than a month ago   Pain Frequency  Intermittent   Aggravating Factors  sit, driving    Pain Relieving Factors extension, heat, traction            [facility] Adult PT Treatment/Exercise - 01/24/17 0001      Self-Care   Other Self-Care Comments  stabilization and core concepts , standing posture      Lumbar Exercises: Stretches   Prone on Elbows Stretch 5 reps;60 seconds   Prone on Elbows Stretch Limitations continuous   Press Ups Limitations 10 reps with centralization     Lumbar Exercises: Supine   Ab Set 10 reps   Clam 10 reps   Bent Knee Raise 10 reps   Bent Knee Raise Limitations needed hands under hips for maintaining neutral    Large Ball Abdominal Isometric 10 reps     Lumbar Exercises: Prone   Opposite Arm/Leg Raise Right arm/Left leg;Left arm/Right leg;10 reps   Other Prone Lumbar Exercises Transverse Abd contraction in prone x 10, added hip ext, arm flexion and opposition for extension based core/LS stab.     Other Prone Lumbar Exercises prone knee flexion x 10 alternating      Lumbar Exercises: Quadruped   Single Arm Raise 5 reps   Opposite Arm/Leg Raise Right arm/Left leg;Left arm/Right leg;10 reps  Modalities   Modalities Cryotherapy     Cryotherapy   Number Minutes Cryotherapy 10 Minutes  post traction, hooklying    Cryotherapy Location Lumbar Spine   Type of Cryotherapy Ice pack     Traction   Type of Traction Lumbar  in prone   Min (lbs) 65   Max (lbs) 85   Hold Time 60   Rest Time 20   Time 15  readjusted belt as it was slipping off torso      Manual Therapy   Joint Mobilization manual traction LLD to each leg in supine                PT Education - 01/24/17 0844    Education provided Yes   Education Details self care   Person(s) Educated Patient   Methods Explanation   Comprehension Verbalized understanding             PT Long Term Goals - 01/24/17 0846      PT LONG TERM GOAL #1   Title independent with HEP   Baseline not core routine    Status  Partially Met     PT LONG TERM GOAL #2   Title verbalize understanding of posture/body mechanics to decrease risk of reinjury    Baseline needs reinforcement    Status On-going     PT LONG TERM GOAL #3   Title perform lumbar ROM without increase in pain for improved function   Status Unable to assess     PT LONG TERM GOAL #4   Title perform simulated golf activities without increase in pain for improved function   Status Unable to assess     PT LONG TERM GOAL #5   Title report 75% improvement in pain while driving for improved tolerance to work responsibilities   Baseline continues to have pain into the posterior thigh   Status On-going               Plan - 01/24/17 0846    Clinical Impression Statement Patient with pain which fluctuates depending on his activities.  He was observed to place an itm on the floor without bending his knees.  Extension based exercises did reduce burning in LLE.  Had not ben given core strengthening exercises as of yet. Needed ice post traction due to discomfort.  TIme spent does not match charges.    PT Next Visit Plan standing core, check ext based HEP , ?traction   PT Home Exercise Plan prone press up, hamstring stretch, rolling with tennis ball, prone press up with sag, bird dog and prone hip ext    Consulted and Agree with Plan of Care Patient      Patient will benefit from skilled therapeutic intervention in order to improve the following deficits and impairments:  Abnormal gait, Pain, Decreased strength, Difficulty walking, Decreased mobility, Decreased range of motion, Impaired flexibility, Postural dysfunction, Increased fascial restricitons, Increased muscle spasms  Visit Diagnosis: Acute left-sided low back pain with left-sided sciatica  Other abnormalities of gait and mobility     Problem List Patient Active Problem List   Diagnosis Date Noted  . Primary osteoarthritis of left hip 05/10/2016  . Status post total replacement  of left hip 05/10/2016  . Headache(784.0) 05/27/2013  . Prostate cancer ([place]) 06/29/2010  . Acute esophagitis 08/15/2007  . MIXED HYPERLIPIDEMIA 03/02/2007  . Allergic rhinitis 11/10/2006  . DEGENERATIVE DISC DISEASE, CERVICAL SPINE 11/10/2006  . [place] DISEASE, LUMBAR SPINE 11/10/2006  Jesica Goheen 01/24/2017, 10:40 AM  [facility] [address] [place], [place], [address] Phone: [phone]   Fax:  [phone]  Name: Daniel Booth MRN: [id number] Date of Birth: [birth year]  Raeford Razor, PT 01/24/17 10:41 AM Phone: [phone] Fax: [phone]

## 2017-01-26 ENCOUNTER — Ambulatory Visit: Payer: 59 | Admitting: Physical Therapy

## 2017-01-26 DIAGNOSIS — M5442 Lumbago with sciatica, left side: Secondary | ICD-10-CM

## 2017-01-26 DIAGNOSIS — R2689 Other abnormalities of gait and mobility: Secondary | ICD-10-CM

## 2017-01-26 NOTE — Therapy (Signed)
[place], [place], [address] Phone: [phone]   Fax:  [phone]  Physical Therapy Treatment  Patient Details  Name: Daniel Booth MRN: [id number] Date of Birth: 11-May-1955 Referring Provider: Joni Fears, MD (see Sherley Bounds, MD (neurosurgery) Monday)  Encounter Date: 01/26/2017      PT End of Session - 01/26/17 1529    Visit Number 8   Number of Visits 12   Date for PT Re-Evaluation 02/02/17   Authorization Type UHC 60 visit limit   PT Start Time 0805   PT Stop Time 0855   PT Time Calculation (min) 50 min   Activity Tolerance Patient tolerated treatment well   Behavior During Therapy [facility] for tasks assessed/performed      Past Medical History:  Diagnosis Date  . Allergy   . Arthritis   . Complication of anesthesia 2011   "vitals dropped with endoscopy"  . Diverticulitis   . Diverticulosis   . GERD (gastroesophageal reflux disease)    on omeprazole  . History of Barrett's esophagus   . History of kidney stones    10-15 yrs ago  . Hyperlipidemia   . Prostate cancer [facility])     Past Surgical History:  Procedure Laterality Date  . COLONOSCOPY    . MOHS SURGERY Right    scalp  . PROSTATE SURGERY  2010  . SHOULDER ARTHROSCOPY  2007   right  . TOTAL HIP ARTHROPLASTY Left 05/10/2016   Procedure: TOTAL HIP ARTHROPLASTY;  Surgeon: Garald Balding, MD;  Location: [place];  Service: Orthopedics;  Laterality: Left;  . UPPER GASTROINTESTINAL ENDOSCOPY      There were no vitals filed for this visit.                       [facility] Adult PT Treatment/Exercise - 01/26/17 0001      Lumbar Exercises: Stretches   Active Hamstring Stretch Limitations reviewed hamstring stretches for car    Quad Stretch Limitations reviewed standing anterior hip fleoxr stretch for driving     Lumbar Exercises: Supine   Ab Set 10 reps   Clam 10 reps   Bent Knee Raise 10 reps   Large Ball  Abdominal Isometric 10 reps     Cryotherapy   Number Minutes Cryotherapy 10 Minutes   Cryotherapy Location Lumbar Spine   Type of Cryotherapy Ice pack     Manual Therapy   Manual Therapy Soft tissue mobilization;Myofascial release   Joint Mobilization grade 3 L1-L5 PA mobs   Soft tissue mobilization IASTM techniques over bil lumbar paraspinals   Myofascial Release stretching/ rolling over bil lumbar parspinals and hamstring                 PT Education - 01/26/17 1528    Education provided Yes   Education Details reviewed potential stretches for his long car ride.    Person(s) Educated Patient   Methods Explanation;Demonstration;Tactile cues;Verbal cues   Comprehension Verbalized understanding;Returned demonstration;Verbal cues required;Tactile cues required             PT Long Term Goals - 01/24/17 0846      PT LONG TERM GOAL #1   Title independent with HEP   Baseline not core routine    Status Partially Met     PT LONG TERM GOAL #2   Title verbalize understanding of posture/body mechanics to decrease risk of reinjury    Baseline needs reinforcement  Status On-going     PT LONG TERM GOAL #3   Title perform lumbar ROM without increase in pain for improved function   Status Unable to assess     PT LONG TERM GOAL #4   Title perform simulated golf activities without increase in pain for improved function   Status Unable to assess     PT LONG TERM GOAL #5   Title report 75% improvement in pain while driving for improved tolerance to work responsibilities   Baseline continues to have pain into the posterior thigh   Status On-going               Plan - 01/26/17 1530    Clinical Impression Statement Patients lower back continues to be reactive. He has increased pain with certain psoitioning Therapy reviewed singk stretch 2nd to quaratus tightness. He had increased radicular symptoms Patient declined needling and traction today 2nd to a 5-6 hour car  ride. He tolerate exercises well.    Clinical Presentation Stable   Clinical Decision Making Moderate   Rehab Potential Good   PT Frequency 2x / week   PT Duration 6 weeks   PT Treatment/Interventions ADLs/Self Care Home Management;Cryotherapy;Electrical Stimulation;Moist Heat;Traction;Ultrasound;Neuromuscular re-education;Therapeutic exercise;Therapeutic activities;Functional mobility training;Stair training;Gait training;Patient/family education;Manual techniques;Passive range of motion;Taping;Dry needling   PT Next Visit Plan standing core, check ext based HEP , ?traction   PT Home Exercise Plan prone press up, hamstring stretch, rolling with tennis ball, prone press up with sag, bird dog and prone hip ext    Consulted and Agree with Plan of Care Patient      Patient will benefit from skilled therapeutic intervention in order to improve the following deficits and impairments:  Abnormal gait, Pain, Decreased strength, Difficulty walking, Decreased mobility, Decreased range of motion, Impaired flexibility, Postural dysfunction, Increased fascial restricitons, Increased muscle spasms  Visit Diagnosis: Acute left-sided low back pain with left-sided sciatica  Other abnormalities of gait and mobility     Problem List Patient Active Problem List   Diagnosis Date Noted  . Primary osteoarthritis of left hip 05/10/2016  . Status post total replacement of left hip 05/10/2016  . Headache(784.0) 05/27/2013  . Prostate cancer ([place]) 06/29/2010  . Acute esophagitis 08/15/2007  . MIXED HYPERLIPIDEMIA 03/02/2007  . Allergic rhinitis 11/10/2006  . DEGENERATIVE DISC DISEASE, CERVICAL SPINE 11/10/2006  . [place] DISEASE, LUMBAR SPINE 11/10/2006    Carney Living PT DPT  01/26/2017, 3:33 PM  [facility] [address] [place], [place], [address] Phone: [phone]   Fax:  [phone]  Name: Daniel Booth MRN: [id number] Date of  Birth: 1956/02/06

## 2017-01-27 ENCOUNTER — Ambulatory Visit (HOSPITAL_COMMUNITY): Payer: 59

## 2017-02-02 ENCOUNTER — Ambulatory Visit: Payer: 59 | Admitting: Physical Therapy

## 2017-03-15 ENCOUNTER — Ambulatory Visit: Payer: 59 | Attending: Orthopaedic Surgery | Admitting: Physical Therapy

## 2017-03-15 DIAGNOSIS — R2689 Other abnormalities of gait and mobility: Secondary | ICD-10-CM | POA: Diagnosis present

## 2017-03-15 DIAGNOSIS — M5442 Lumbago with sciatica, left side: Secondary | ICD-10-CM | POA: Insufficient documentation

## 2017-03-16 ENCOUNTER — Encounter: Payer: Self-pay | Admitting: Physical Therapy

## 2017-03-16 NOTE — Therapy (Signed)
[place], [place], [address] Phone: [phone]   Fax:  [phone]  Physical Therapy Re-Evaluation  Patient Details  Name: Daniel Booth MRN: [id number] Date of Birth: [birth year] Referring Provider: Dr Sherley Bounds    Encounter Date: 03/15/2017  PT End of Session - 03/15/17 0815    Visit Number  9    Number of Visits  20    Date for PT Re-Evaluation  04/27/17    Authorization Type  UHC 60 visit limit    PT Start Time  0805    PT Stop Time  0900    PT Time Calculation (min)  55 min    Activity Tolerance  Patient tolerated treatment well    Behavior During Therapy  [facility] for tasks assessed/performed       Past Medical History:  Diagnosis Date  . Allergy   . Arthritis   . Complication of anesthesia 2011   "vitals dropped with endoscopy"  . Diverticulitis   . Diverticulosis   . GERD (gastroesophageal reflux disease)    on omeprazole  . History of Barrett's esophagus   . History of kidney stones    10-15 yrs ago  . Hyperlipidemia   . Prostate cancer [facility])     Past Surgical History:  Procedure Laterality Date  . COLONOSCOPY    . MOHS SURGERY Right    scalp  . PROSTATE SURGERY  2010  . SHOULDER ARTHROSCOPY  2007   right  . TOTAL HIP ARTHROPLASTY Left 05/10/2016   Procedure: TOTAL HIP ARTHROPLASTY;  Surgeon: Garald Balding, MD;  Location: [place];  Service: Orthopedics;  Laterality: Left;  . UPPER GASTROINTESTINAL ENDOSCOPY      There were no vitals filed for this visit.   Subjective Assessment - 03/15/17 0818    Subjective  Patient returns electingnot to have the surgery done. He has been doing exercises at home which have helped his pain. He is having a slight pull in his back but overall his pain has decreased significalty. He is no longer having radicular pain. He has been doing his press-ups and stretches.     Limitations  House hold activities;Sitting    How long can you sit  comfortably?  couple hours-has to shift and reposition    Diagnostic tests  MRI: protruding L5 disc    Patient Stated Goals  improve pain, return to golf/tennis    Currently in Pain?  Yes    Pain Score  1     Pain Orientation  Left    Pain Descriptors / Indicators  Tightness    Pain Type  Chronic pain    Pain Onset  More than a month ago    Pain Frequency  Intermittent    Aggravating Factors   use of the right leg     Pain Relieving Factors  extension, heat and traction     Effect of Pain on Daily Activities  unable to golf          [facility] PT Assessment - 03/16/17 0001      Assessment   Medical Diagnosis  LBP, Lt hip pain    Referring Provider  Dr Dorathy Kinsman     Onset Date/Surgical Date  -- May 2018    Hand Dominance  Right    Next MD Visit  --    Prior Therapy  none for this condition      Precautions   Precautions  None  Restrictions   Weight Bearing Restrictions  No      Balance Screen   Has the patient fallen in the past 6 months  No    Has the patient had a decrease in activity level because of a fear of falling?   No    Is the patient reluctant to leave their home because of a fear of falling?   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    Leisure  tennis, golf, walking      Cognition   Overall Cognitive Status  Within Functional Limits for tasks assessed      Observation/Other Assessments    Focus on Therapeutic Outcomes (FOTO)   24% limitation       Sensation   Additional Comments  dneies radiculr pain at this timebut feels a pulling in his back that can go into his left buttock       Coordination   Gross Motor Movements are Fluid and Coordinated  Yes    Fine Motor Movements are Fluid and Coordinated  Yes      Posture/Postural Control   Posture/Postural Control  Postural limitations    Postural Limitations  Rounded Shoulders;Forward head      AROM   Lumbar Flexion  25% limited     Lumbar Extension  No limit     Lumbar - Right Side Bend  No limit  pain at end range    Lumbar - Left Side Bend  no limit  pain returning to neutral      Strength   Right Hip Flexion  5/5    Right Hip Extension  5/5    Right Hip External Rotation   5/5    Right Hip Internal Rotation  5/5    Left Hip Flexion  5/5    Left Hip Extension  4+/5    Left Hip External Rotation  4+/5    Left Hip Internal Rotation  4+/5    Left Hip ABduction  4+/5    Right Knee Flexion  5/5    Right Knee Extension  5/5    Left Knee Flexion  4+/5 with pain    Left Knee Extension  5/5    Right Ankle Dorsiflexion  5/5    Left Ankle Dorsiflexion  5/5      Flexibility   Soft Tissue Assessment /Muscle Length  yes    Hamstrings  tightness Lt > Rt    Piriformis  tightness Lt      Palpation   Spinal mobility  tenderness and pain L4/5    SI assessment   WNL    Palpation comment  minor spasming of left lumbar spine and into the left glut.       Special Tests    Special Tests  Lumbar  Lumbar Tests  Straight Leg Raise;other      Straight Leg Raise   Findings  Negative      other   Comments  --      Ambulation/Gait   Gait Pattern  Decreased stance time - left;Decreased step length - right;Antalgic             Objective measurements completed on examination: See above findings.      [place] Adult PT Treatment/Exercise - 03/16/17 0001      Lumbar Exercises: Stretches   Active Hamstring Stretch  Limitations  2x20 sec hold with strap     Piriformis Stretch  3 reps;30 seconds    Piriformis Stretch Limitations  seated; knee towards opposite shoulder; pt very tight      Lumbar Exercises: Standing   Other Standing Lumbar Exercises  lateral band walk red  5 steps lef and right;     Other Standing Lumbar Exercises  single leg stance 4x10 second hold with min UE support.       Lumbar Exercises: Supine   Clam  20 reps 2x10 green    Bent Knee Raise Limitations  2x10    Bridge Limitations  2x10       Cryotherapy   Number Minutes Cryotherapy  10 Minutes    Cryotherapy Location  Lumbar Spine    Type of Cryotherapy  Ice pack      Traction   Type of Traction  Lumbar in prone    Min (lbs)  65    Max (lbs)  85    Hold Time  60    Rest Time  20    Time  15 readjusted belt as it was slipping off torso              PT Education - 03/15/17 0827    Education provided  Yes    Education Details  reviewed new HEP     Person(s) Educated  Patient    Methods  Explanation;Demonstration;Tactile cues;Verbal cues    Comprehension  Verbalized understanding;Returned demonstration;Verbal cues required;Tactile cues required          PT Long Term Goals - 03/16/17 0818      PT LONG TERM GOAL #1   Title  Pt will be independent with HEP for return to sport     Time  6    Period  Weeks    Status  New      PT LONG TERM GOAL #2   Title  Patient will demonstrate good core control with sport specific activity     Time  6    Period  Weeks    Status  New      PT LONG TERM GOAL #3   Title  Patient will return to tennis without increased radicular symptoms and without pain in the left hip    Time  6    Period  Weeks    Status  New      PT LONG TERM GOAL #4   Title  Patient will report no pain wihle driving in order to perform work tasks     Time  [place] - 03/15/17 0827    Clinical Impression Statement  Patient returns to therapy  with mild pain in his lower back. He has shown increases in strength and lumbar mobility. He is having very  little radicular pain at this time. He would like to get back to playing tennis. Patient will be given a very gradual return to sport program and review core strengthening with tennis motions. He reported feeling like the traction really helped his pain the first time. He is still having minor radicualr pain. After a trial of meachnical traction     Clinical Presentation  Stable    Clinical Decision Making  Moderate    PT Frequency  2x / week    PT Duration  6 weeks    PT Treatment/Interventions  ADLs/Self Care Home Management;Cryotherapy;Electrical Stimulation;Moist Heat;Traction;Ultrasound;Neuromuscular re-education;Therapeutic exercise;Therapeutic activities;Functional mobility training;Stair training;Gait training;Patient/family education;Manual techniques;Passive range of motion;Taping;Dry needling    PT Next Visit Plan  standing core, check ext based HEP , ?traction    PT Home Exercise Plan  prone press up, hamstring stretch, rolling with tennis ball, prone press up with sag, bird dog and prone hip ext        Patient will benefit from skilled therapeutic intervention in order to improve the following deficits and impairments:  Abnormal gait, Pain, Decreased strength, Difficulty walking, Decreased mobility, Decreased range of motion, Impaired flexibility, Postural dysfunction, Increased fascial restricitons, Increased muscle spasms  Visit Diagnosis: Acute left-sided low back pain with left-sided sciatica - Plan: PT plan of care cert/re-cert  Other abnormalities of gait and mobility - Plan: PT plan of care cert/re-cert     Problem List Patient Active Problem List   Diagnosis Date Noted  . Primary osteoarthritis of left hip 05/10/2016  . Status post total replacement of left hip 05/10/2016  . Headache(784.0) 05/27/2013  . Prostate cancer ([place]) 06/29/2010  . Acute esophagitis  08/15/2007  . MIXED HYPERLIPIDEMIA 03/02/2007  . Allergic rhinitis 11/10/2006  . DEGENERATIVE DISC DISEASE, CERVICAL SPINE 11/10/2006  . [place] DISEASE, LUMBAR SPINE 11/10/2006    Carney Living PT DPT  03/16/2017, 8:28 AM  [facility] [address] [place], [place], [address] Phone: [phone]   Fax:  [phone]  Name: Daniel Booth MRN: [id number] Date of Birth: 10-Oct-1955

## 2017-03-29 ENCOUNTER — Ambulatory Visit: Payer: 59 | Admitting: Physical Therapy

## 2017-03-29 DIAGNOSIS — M5442 Lumbago with sciatica, left side: Secondary | ICD-10-CM

## 2017-03-29 DIAGNOSIS — R2689 Other abnormalities of gait and mobility: Secondary | ICD-10-CM

## 2017-03-29 NOTE — Therapy (Signed)
[place], [place], [address] Phone: [phone]   Fax:  [phone]  Physical Therapy Treatment  Patient Details  Name: Daniel Booth MRN: [id number] Date of Birth: 1955-12-29 Referring Provider: Dr Dorathy Kinsman    Encounter Date: 03/29/2017  PT End of Session - 03/29/17 0807    Visit Number  10    Number of Visits  20    Date for PT Re-Evaluation  04/27/17    Authorization Type  UHC 60 visit limit    PT Start Time  0803    PT Stop Time  0845    PT Time Calculation (min)  42 min    Activity Tolerance  Patient tolerated treatment well    Behavior During Therapy  [facility] for tasks assessed/performed       Past Medical History:  Diagnosis Date  . Allergy   . Arthritis   . Complication of anesthesia 2011   "vitals dropped with endoscopy"  . Diverticulitis   . Diverticulosis   . GERD (gastroesophageal reflux disease)    on omeprazole  . History of Barrett's esophagus   . History of kidney stones    10-15 yrs ago  . Hyperlipidemia   . Prostate cancer [facility])     Past Surgical History:  Procedure Laterality Date  . COLONOSCOPY    . MOHS SURGERY Right    scalp  . PROSTATE SURGERY  2010  . SHOULDER ARTHROSCOPY  2007   right  . TOTAL HIP ARTHROPLASTY Left 05/10/2016   Procedure: TOTAL HIP ARTHROPLASTY;  Surgeon: Garald Balding, MD;  Location: [place];  Service: Orthopedics;  Laterality: Left;  . UPPER GASTROINTESTINAL ENDOSCOPY      There were no vitals filed for this visit.  Subjective Assessment - 03/29/17 0806    Subjective  Patient reports just some stiffness in his back. overall he is doing well no pain. He has done a little but of band workj but he could feel it in his hip.     Limitations  House hold activities;Sitting    How long can you sit comfortably?  couple hours-has to shift and reposition    Diagnostic tests  MRI: protruding L5 disc    Patient Stated Goals  improve pain, return  to golf/tennis    Currently in Pain?  No/denies                      [facility] Adult PT Treatment/Exercise - 03/29/17 1406      Lumbar Exercises: Stretches   Active Hamstring Stretch Limitations  2x20 sec hold with strap     Lower Trunk Rotation  -- 10x    Piriformis Stretch  3 reps;30 seconds    Piriformis Stretch Limitations  seated; knee towards opposite shoulder; pt very tight      Lumbar Exercises: Standing   Other Standing Lumbar Exercises  standing chop up and down red 2x10 each way with cuing for techique; Pallowf press 2x10 bilateral red;     Other Standing Lumbar Exercises  single leg stance cone touch 2x10       Lumbar Exercises: Supine   Clam  20 reps 2x10 green    Bent Knee Raise Limitations  2x10    Bridge Limitations  2x10       Cryotherapy   Number Minutes Cryotherapy  12 Minutes    Cryotherapy Location  Lumbar Spine  PT Education - 03/29/17 0807    Education provided  Yes    Education Details  added sport specific exercises     Person(s) Educated  Patient    Methods  Explanation;Tactile cues;Demonstration;Verbal cues;Handout    Comprehension  Verbalized understanding;Returned demonstration;Verbal cues required;Tactile cues required          PT Long Term Goals - 03/29/17 1408      PT LONG TERM GOAL #1   Title  Pt will be independent with HEP for return to sport     Baseline  given today     Time  6    Period  Weeks    Status  On-going      PT LONG TERM GOAL #2   Title  Patient will demonstrate good core control with sport specific activity     Baseline  needs reinforcement with sports activity     Time  6    Period  Weeks    Status  On-going      PT LONG TERM GOAL #3   Title  Patient will return to tennis without increased radicular symptoms and without pain in the left hip    Time  6    Period  Weeks    Status  On-going      PT LONG TERM GOAL #4   Title  Patient will report no pain wihle driving in order to  perform work tasks     Time  6    Period  Weeks    Status  On-going      PT LONG TERM GOAL #5   Title  report 75% improvement in pain while driving for improved tolerance to work responsibilities    Baseline  continues to have pain into the posterior thigh    Time  6    Period  Weeks    Status  On-going            Plan - 03/29/17 8299    Clinical Impression Statement  Patient tolerate treatment well. He had no sigfnificant increase in pain. He continues to have some tightness but overall he is doing well. Therapy added sports specific exercises to his HEP. He reported minor sitiffness. Traction held 2nd to no radicualr symptoms.     Clinical Presentation  Stable    Clinical Decision Making  Moderate    Rehab Potential  Good    PT Frequency  2x / week    PT Duration  6 weeks    PT Treatment/Interventions  ADLs/Self Care Home Management;Cryotherapy;Electrical Stimulation;Moist Heat;Traction;Ultrasound;Neuromuscular re-education;Therapeutic exercise;Therapeutic activities;Functional mobility training;Stair training;Gait training;Patient/family education;Manual techniques;Passive range of motion;Taping;Dry needling    PT Next Visit Plan  standing core, check ext based HEP , ?traction    PT Home Exercise Plan  prone press up, hamstring stretch, rolling with tennis ball, prone press up with sag, bird dog and prone hip ext     Consulted and Agree with Plan of Care  Patient       Patient will benefit from skilled therapeutic intervention in order to improve the following deficits and impairments:  Abnormal gait, Pain, Decreased strength, Difficulty walking, Decreased mobility, Decreased range of motion, Impaired flexibility, Postural dysfunction, Increased fascial restricitons, Increased muscle spasms  Visit Diagnosis: Acute left-sided low back pain with left-sided sciatica  Other abnormalities of gait and mobility     Problem List Patient Active Problem List   Diagnosis Date  Noted  . Primary osteoarthritis of left hip 05/10/2016  .  Status post total replacement of left hip 05/10/2016  . Headache(784.0) 05/27/2013  . Prostate cancer ([place]) 06/29/2010  . Acute esophagitis 08/15/2007  . MIXED HYPERLIPIDEMIA 03/02/2007  . Allergic rhinitis 11/10/2006  . DEGENERATIVE DISC DISEASE, CERVICAL SPINE 11/10/2006  . [place] DISEASE, LUMBAR SPINE 11/10/2006    Carney Living PT DPT 03/29/2017, 2:09 PM  [facility] [address] [place], [place], [address] Phone: [phone]   Fax:  [phone]  Name: Daniel Booth MRN: [id number] Date of Birth: 09-06-1955

## 2017-04-05 ENCOUNTER — Ambulatory Visit: Payer: 59 | Attending: Orthopaedic Surgery | Admitting: Physical Therapy

## 2017-04-05 DIAGNOSIS — M5442 Lumbago with sciatica, left side: Secondary | ICD-10-CM | POA: Insufficient documentation

## 2017-04-05 DIAGNOSIS — R2689 Other abnormalities of gait and mobility: Secondary | ICD-10-CM | POA: Insufficient documentation

## 2017-04-05 NOTE — Therapy (Signed)
[place], [place], [address] Phone: [phone]   Fax:  [phone]  Physical Therapy Treatment  Patient Details  Name: Daniel Booth MRN: [id number] Date of Birth: 1956-02-17 Referring Provider: Dr Dorathy Kinsman    Encounter Date: 04/05/2017  PT End of Session - 04/05/17 0808    Visit Number  11    Number of Visits  20    Date for PT Re-Evaluation  04/27/17    Authorization Type  UHC 60 visit limit    PT Start Time  0800    PT Stop Time  0850    PT Time Calculation (min)  50 min    Activity Tolerance  Patient tolerated treatment well    Behavior During Therapy  [facility] for tasks assessed/performed       Past Medical History:  Diagnosis Date  . Allergy   . Arthritis   . Complication of anesthesia 2011   "vitals dropped with endoscopy"  . Diverticulitis   . Diverticulosis   . GERD (gastroesophageal reflux disease)    on omeprazole  . History of Barrett's esophagus   . History of kidney stones    10-15 yrs ago  . Hyperlipidemia   . Prostate cancer [facility])     Past Surgical History:  Procedure Laterality Date  . COLONOSCOPY    . MOHS SURGERY Right    scalp  . PROSTATE SURGERY  2010  . SHOULDER ARTHROSCOPY  2007   right  . TOTAL HIP ARTHROPLASTY Left 05/10/2016   Procedure: TOTAL HIP ARTHROPLASTY;  Surgeon: Garald Balding, MD;  Location: [place];  Service: Orthopedics;  Laterality: Left;  . UPPER GASTROINTESTINAL ENDOSCOPY      There were no vitals filed for this visit.  Subjective Assessment - 04/05/17 0805    Subjective  Patient reports some stiffness in his back but overall he feels like hie is doing better. He has been doing more of his back exercises. he has tried some qucik movements but felt some instability.     Limitations  House hold activities;Sitting    How long can you sit comfortably?  couple hours-has to shift and reposition    Diagnostic tests  MRI: protruding L5 disc     Patient Stated Goals  improve pain, return to golf/tennis    Currently in Pain?  No/denies                      [facility] Adult PT Treatment/Exercise - 04/05/17 0001      Lumbar Exercises: Stretches   Active Hamstring Stretch Limitations  2x20 sec hold with strap     Lower Trunk Rotation Limitations  x10      Lumbar Exercises: Machines for Strengthening   Leg Press  2x10 40lbs     Other Lumbar Machine Exercise  [place] press on tower 2x10 4 pl; chop on tower 2x10 2lb       Lumbar Exercises: Standing   Other Standing Lumbar Exercises  lateral band walk 2x10;     Other Standing Lumbar Exercises  single leg stance 2x20 sec ; cone touch 2x10       Lumbar Exercises: Supine   Bridge Limitations  2x10              PT Education - 04/05/17 0807    Education provided  Yes    Education Details  reviewed exercises     Person(s) Educated  Patient    Methods  Explanation;Demonstration;Tactile cues;Verbal cues    Comprehension  Verbalized understanding;Returned demonstration;Verbal cues required;Tactile cues required          PT Long Term Goals - 03/29/17 1408      PT LONG TERM GOAL #1   Title  Pt will be independent with HEP for return to sport     Baseline  given today     Time  6    Period  Weeks    Status  On-going      PT LONG TERM GOAL #2   Title  Patient will demonstrate good core control with sport specific activity     Baseline  needs reinforcement with sports activity     Time  6    Period  Weeks    Status  On-going      PT LONG TERM GOAL #3   Title  Patient will return to tennis without increased radicular symptoms and without pain in the left hip    Time  6    Period  Weeks    Status  On-going      PT LONG TERM GOAL #4   Title  Patient will report no pain wihle driving in order to perform work tasks     Time  6    Period  Weeks    Status  On-going      PT LONG TERM GOAL #5   Title  report 75% improvement in pain while driving for improved  tolerance to work responsibilities    Baseline  continues to have pain into the posterior thigh    Time  6    Period  Weeks    Status  On-going            Plan - 04/05/17 0917    Clinical Impression Statement  Therapy continued to focus on sport specific activity. He had no pain with treatment. Therapy added leg press. He reported it felt good. No increase in pain.     Clinical Presentation  Stable    Clinical Decision Making  Low    Rehab Potential  Good    PT Frequency  2x / week    PT Duration  6 weeks    PT Treatment/Interventions  ADLs/Self Care Home Management;Cryotherapy;Electrical Stimulation;Moist Heat;Traction;Ultrasound;Neuromuscular re-education;Therapeutic exercise;Therapeutic activities;Functional mobility training;Stair training;Gait training;Patient/family education;Manual techniques;Passive range of motion;Taping;Dry needling    PT Next Visit Plan  standing core, check ext based HEP , ?traction    PT Home Exercise Plan  prone press up, hamstring stretch, rolling with tennis ball, prone press up with sag, bird dog and prone hip ext     Consulted and Agree with Plan of Care  Patient       Patient will benefit from skilled therapeutic intervention in order to improve the following deficits and impairments:  Abnormal gait, Pain, Decreased strength, Difficulty walking, Decreased mobility, Decreased range of motion, Impaired flexibility, Postural dysfunction, Increased fascial restricitons, Increased muscle spasms  Visit Diagnosis: Acute left-sided low back pain with left-sided sciatica  Other abnormalities of gait and mobility     Problem List Patient Active Problem List   Diagnosis Date Noted  . Primary osteoarthritis of left hip 05/10/2016  . Status post total replacement of left hip 05/10/2016  . Headache(784.0) 05/27/2013  . Prostate cancer ([place]) 06/29/2010  . Acute esophagitis 08/15/2007  . MIXED HYPERLIPIDEMIA 03/02/2007  . Allergic rhinitis 11/10/2006   . DEGENERATIVE DISC DISEASE, CERVICAL SPINE 11/10/2006  . [place] DISEASE, LUMBAR SPINE 11/10/2006  Carney Living  PT DPT  04/05/2017, 9:23 AM  [facility] [address] [place], [place], [address] Phone: [phone]   Fax:  [phone]  Name: Daniel Booth MRN: [id number] Date of Birth: November 14, 1955

## 2017-04-12 ENCOUNTER — Ambulatory Visit: Payer: 59 | Admitting: Physical Therapy

## 2017-04-19 ENCOUNTER — Ambulatory Visit: Payer: 59 | Admitting: Physical Therapy

## 2017-04-19 DIAGNOSIS — M5442 Lumbago with sciatica, left side: Secondary | ICD-10-CM | POA: Diagnosis not present

## 2017-04-19 DIAGNOSIS — R2689 Other abnormalities of gait and mobility: Secondary | ICD-10-CM

## 2017-04-19 NOTE — Therapy (Signed)
[place], [place], [address] Phone: [phone]   Fax:  [phone]  Physical Therapy Treatment  Patient Details  Name: Daniel Booth MRN: [id number] Date of Birth: 02/28/1956 Referring Provider: Dr Dorathy Kinsman    Encounter Date: 04/19/2017  PT End of Session - 04/19/17 0807    Visit Number  12    Number of Visits  20    Date for PT Re-Evaluation  04/27/17    Authorization Type  [employer] 60 visit limit    PT Start Time  0800    PT Stop Time  0907    PT Time Calculation (min)  67 min    Activity Tolerance  Patient tolerated treatment well    Behavior During Therapy  [facility] for tasks assessed/performed       Past Medical History:  Diagnosis Date  . Allergy   . Arthritis   . Complication of anesthesia 2011   "vitals dropped with endoscopy"  . Diverticulitis   . Diverticulosis   . GERD (gastroesophageal reflux disease)    on omeprazole  . History of Barrett's esophagus   . History of kidney stones    10-15 yrs ago  . Hyperlipidemia   . Prostate cancer [facility])     Past Surgical History:  Procedure Laterality Date  . COLONOSCOPY    . MOHS SURGERY Right    scalp  . PROSTATE SURGERY  2010  . SHOULDER ARTHROSCOPY  2007   right  . TOTAL HIP ARTHROPLASTY Left 05/10/2016   Procedure: TOTAL HIP ARTHROPLASTY;  Surgeon: Garald Balding, MD;  Location: [place];  Service: Orthopedics;  Laterality: Left;  . UPPER GASTROINTESTINAL ENDOSCOPY      There were no vitals filed for this visit.  Subjective Assessment - 04/19/17 0804    Subjective  Patients back was doing well until he had to shovel snow last week. It has been sore since. he has been using the satretxhes and it has been feeling better. He feels just a muslce ache in the back.     Limitations  House hold activities;Sitting    How long can you sit comfortably?  couple hours-has to shift and reposition    Diagnostic tests  MRI: protruding L5 disc    Patient Stated Goals  improve pain, return to golf/tennis    Currently in Pain?  Yes    Pain Score  3     Pain Location  Back    Pain Orientation  Left    Pain Descriptors / Indicators  Tightness    Pain Type  Chronic pain    Pain Onset  More than a month ago    Pain Frequency  Intermittent    Aggravating Factors   use of the right leg     Pain Relieving Factors  extension, heat                       [facility] Adult PT Treatment/Exercise - 04/19/17 0001      Lumbar Exercises: Stretches   Active Hamstring Stretch Limitations  2x20 sec hold with strap     Lower Trunk Rotation Limitations  x10    Piriformis Stretch  3 reps;30 seconds      Lumbar Exercises: Supine   Clam  20 reps 2x10 green    Bent Knee Raise Limitations  2x10    Bridge Limitations  x10 with some pain       Cryotherapy  Number Minutes Cryotherapy  10 Minutes    Cryotherapy Location  Lumbar Spine      Traction   Type of Traction  Lumbar in prone    Min (lbs)  65    Max (lbs)  85    Hold Time  60    Rest Time  20    Time  15 readjusted belt as it was slipping off torso       Manual Therapy   Joint Mobilization  LAD left 3x30 sec hold     Soft tissue mobilization  IASTM techniques over bil lumbar paraspinals             PT Education - 04/19/17 0806    Education provided  Yes    Education Details  reviewed self soft tissue mobilization     Person(s) Educated  Patient    Methods  Explanation;Demonstration;Tactile cues;Verbal cues    Comprehension  Verbalized understanding;Returned demonstration;Verbal cues required;Tactile cues required          PT Long Term Goals - 03/29/17 1408      PT LONG TERM GOAL #1   Title  Pt will be independent with HEP for return to sport     Baseline  given today     Time  6    Period  Weeks    Status  On-going      PT LONG TERM GOAL #2   Title  Patient will demonstrate good core control with sport specific activity     Baseline  needs  reinforcement with sports activity     Time  6    Period  Weeks    Status  On-going      PT LONG TERM GOAL #3   Title  Patient will return to tennis without increased radicular symptoms and without pain in the left hip    Time  6    Period  Weeks    Status  On-going      PT LONG TERM GOAL #4   Title  Patient will report no pain wihle driving in order to perform work tasks     Time  6    Period  Weeks    Status  On-going      PT LONG TERM GOAL #5   Title  report 75% improvement in pain while driving for improved tolerance to work responsibilities    Baseline  continues to have pain into the posterior thigh    Time  6    Period  Weeks    Status  On-going            Plan - 04/19/17 0807    Clinical Impression Statement  zPatient tolerated treatment well. He had no increase in pain. Therapy worked on manual therapy to decrease left sided spasming. He was encouraged to keep stretching and to keep working on light exercises.     Clinical Presentation  Stable    Clinical Decision Making  Low    Rehab Potential  Good    PT Frequency  2x / week    PT Duration  6 weeks    PT Treatment/Interventions  ADLs/Self Care Home Management;Cryotherapy;Electrical Stimulation;Moist Heat;Traction;Ultrasound;Neuromuscular re-education;Therapeutic exercise;Therapeutic activities;Functional mobility training;Stair training;Gait training;Patient/family education;Manual techniques;Passive range of motion;Taping;Dry needling    PT Next Visit Plan  standing core, check ext based HEP , ?traction    PT Home Exercise Plan  prone press up, hamstring stretch, rolling with tennis ball, prone press up with sag, bird dog  and prone hip ext     Consulted and Agree with Plan of Care  Patient       Patient will benefit from skilled therapeutic intervention in order to improve the following deficits and impairments:  Abnormal gait, Pain, Decreased strength, Difficulty walking, Decreased mobility, Decreased range  of motion, Impaired flexibility, Postural dysfunction, Increased fascial restricitons, Increased muscle spasms  Visit Diagnosis: Acute left-sided low back pain with left-sided sciatica  Other abnormalities of gait and mobility     Problem List Patient Active Problem List   Diagnosis Date Noted  . Primary osteoarthritis of left hip 05/10/2016  . Status post total replacement of left hip 05/10/2016  . Headache(784.0) 05/27/2013  . Prostate cancer ([place]) 06/29/2010  . Acute esophagitis 08/15/2007  . MIXED HYPERLIPIDEMIA 03/02/2007  . Allergic rhinitis 11/10/2006  . DEGENERATIVE DISC DISEASE, CERVICAL SPINE 11/10/2006  . [place] DISEASE, LUMBAR SPINE 11/10/2006    Carney Living 04/19/2017, 3:04 PM  [facility] [address] [place], [place], [address] Phone: [phone]   Fax:  [phone]  Name: Daniel Booth MRN: [id number] Date of Birth: [birth year]

## 2017-05-02 HISTORY — PX: LAMINECTOMY AND MICRODISCECTOMY LUMBAR SPINE: SHX1913

## 2017-05-03 ENCOUNTER — Encounter: Payer: Self-pay | Admitting: Physical Therapy

## 2017-05-03 ENCOUNTER — Ambulatory Visit: Payer: 59 | Attending: Orthopaedic Surgery | Admitting: Physical Therapy

## 2017-05-03 DIAGNOSIS — R2689 Other abnormalities of gait and mobility: Secondary | ICD-10-CM | POA: Diagnosis present

## 2017-05-03 DIAGNOSIS — M5442 Lumbago with sciatica, left side: Secondary | ICD-10-CM | POA: Insufficient documentation

## 2017-05-03 NOTE — Therapy (Addendum)
[place], [place], [address] Phone: [phone]   Fax:  [phone]  Physical Therapy Treatment recert   Patient Details  Name: Daniel Booth MRN: [id number] Date of Birth: [birth year] Referring Provider: Dr Dorathy Kinsman    Encounter Date: 05/03/2017  PT End of Session - 05/03/17 0856    Visit Number  13    Number of Visits  20    Date for PT Re-Evaluation  06/05/2017   Authorization Type  UHC 60 visit limit    PT Start Time  0855 10 minutes late     PT Stop Time  0945    PT Time Calculation (min)  50 min       Past Medical History:  Diagnosis Date  . Allergy   . Arthritis   . Complication of anesthesia 2011   "vitals dropped with endoscopy"  . Diverticulitis   . Diverticulosis   . GERD (gastroesophageal reflux disease)    on omeprazole  . History of Barrett's esophagus   . History of kidney stones    10-15 yrs ago  . Hyperlipidemia   . Prostate cancer [facility])     Past Surgical History:  Procedure Laterality Date  . COLONOSCOPY    . MOHS SURGERY Right    scalp  . PROSTATE SURGERY  2010  . SHOULDER ARTHROSCOPY  2007   right  . TOTAL HIP ARTHROPLASTY Left 05/10/2016   Procedure: TOTAL HIP ARTHROPLASTY;  Surgeon: Garald Balding, MD;  Location: [place];  Service: Orthopedics;  Laterality: Left;  . UPPER GASTROINTESTINAL ENDOSCOPY      There were no vitals filed for this visit.  Subjective Assessment - 05/03/17 0856    Currently in Pain?  Yes    Pain Score  1     Pain Location  Back    Pain Orientation  Right    Pain Descriptors / Indicators  -- dart    Aggravating Factors   not that aware of                       [facility] Adult PT Treatment/Exercise - 05/03/17 0001      Lumbar Exercises: Stretches   Active Hamstring Stretch Limitations  2x20 sec hold with strap     Lower Trunk Rotation Limitations  x10    Prone on Elbows Stretch  1 rep;60 seconds    Press Ups  Limitations  10 reps       Lumbar Exercises: Standing   Other Standing Lumbar Exercises  standing chop up and down red 2x10 each way with cuing for techique; Pallowf press 2x10 bilateral red; lateral band walks green band 2 x 10     Other Standing Lumbar Exercises  single leg stance 2x20 sec ; cone touch 2x10       Lumbar Exercises: Supine   Clam  20 reps 2x10 green    Bridge Limitations  2x10  low back tightness, not pain      Lumbar Exercises: Prone   Opposite Arm/Leg Raise  20 reps;Right arm/Left leg;Left arm/Right leg      Lumbar Exercises: Quadruped   Opposite Arm/Leg Raise  10 reps;Left arm/Right leg;Right arm/Left leg cues to increase hold time to 10 seconds       Manual Therapy   Soft tissue mobilization  trigger point release right lower lumbar paraspinals       Moist Heat Pack       15  minutes, Lumbar Spine             PT Long Term Goals - 05/03/17 0907      PT LONG TERM GOAL #1   Title  Pt will be independent with HEP for return to sport     Time  6    Period  Weeks    Status  On-going      PT LONG TERM GOAL #2   Title  Patient will demonstrate good core control with sport specific activity     Baseline  needs reinforcement with sports activity     Time  6    Period  Weeks    Status  On-going      PT LONG TERM GOAL #3   Title  Patient will return to tennis without increased radicular symptoms and without pain in the left hip    Status  Unable to assess      PT LONG TERM GOAL #4   Title  Patient will report no pain wihle driving in order to perform work tasks     Status  Achieved      PT LONG TERM GOAL #5   Title  report 75% improvement in pain while driving for improved tolerance to work responsibilities    Time  6    Period  Weeks    Status  Achieved            Plan - 05/03/17 1054    Clinical Impression Statement  Pt reports 1/10 pain. He reports a stumble over a curb without fall that exacerbated his pain since last visit. He has not  been consistent with HEP since last visit so we reviewed and returned to SLS activities and standing core with only "tightness" reported in lumbar, no pain. Trigger point release to area of tenderness in right lower paraspinal followed by HMP. Pt would like to continue 1 x per week to progress toward return to tennis, golf. He does reports no longer has radicular pain and can drive without pain. LTG #4, #5  met.     PT Next Visit Plan  standing core, check ext based HEP , ?traction    PT Home Exercise Plan  prone press up, hamstring stretch, rolling with tennis ball, prone press up with sag, bird dog and prone hip ext        Patient will benefit from skilled therapeutic intervention in order to improve the following deficits and impairments:  Abnormal gait, Pain, Decreased strength, Difficulty walking, Decreased mobility, Decreased range of motion, Impaired flexibility, Postural dysfunction, Increased fascial restricitons, Increased muscle spasms  Visit Diagnosis: Acute left-sided low back pain with left-sided sciatica  Other abnormalities of gait and mobility     Problem List Patient Active Problem List   Diagnosis Date Noted  . Primary osteoarthritis of left hip 05/10/2016  . Status post total replacement of left hip 05/10/2016  . Headache(784.0) 05/27/2013  . Prostate cancer ([place]) 06/29/2010  . Acute esophagitis 08/15/2007  . MIXED HYPERLIPIDEMIA 03/02/2007  . Allergic rhinitis 11/10/2006  . DEGENERATIVE DISC DISEASE, CERVICAL SPINE 11/10/2006  . DEGENERATIVE DISC DISEASE, LUMBAR SPINE 11/10/2006    Dorene Ar, PTA 05/03/2017, 10:59 AM   Carolyne Littles PT DPT  05/08/2017  [facility] [address] [place], [place], [address] Phone: [phone]   Fax:  [phone]  Name: JAKEVIOUS HOLLISTER MRN: [id number] Date of Birth: 02/11/1956

## 2017-05-08 NOTE — Addendum Note (Signed)
Addended by: Carney Living on: 05/08/2017 10:22 AM   Modules accepted: Orders

## 2017-05-11 ENCOUNTER — Encounter: Payer: Self-pay | Admitting: Physical Therapy

## 2017-05-11 ENCOUNTER — Ambulatory Visit: Payer: 59 | Admitting: Physical Therapy

## 2017-05-11 DIAGNOSIS — M5442 Lumbago with sciatica, left side: Secondary | ICD-10-CM

## 2017-05-11 DIAGNOSIS — R2689 Other abnormalities of gait and mobility: Secondary | ICD-10-CM

## 2017-05-11 NOTE — Therapy (Signed)
[place], [place], [address] Phone: [phone]   Fax:  [phone]  Physical Therapy Treatment  Patient Details  Name: Daniel Booth MRN: [id number] Date of Birth: 29-Mar-1956 Referring Provider: Dr Sherley Bounds    Encounter Date: 05/11/2017  PT End of Session - 05/11/17 0809    Visit Number  14    Number of Visits  20    Date for PT Re-Evaluation  06/05/17    Authorization Type  UHC 60 visit limit    PT Start Time  0800    PT Stop Time  0843    PT Time Calculation (min)  43 min    Activity Tolerance  Patient tolerated treatment well    Behavior During Therapy  [facility] for tasks assessed/performed       Past Medical History:  Diagnosis Date  . Allergy   . Arthritis   . Complication of anesthesia 2011   "vitals dropped with endoscopy"  . Diverticulitis   . Diverticulosis   . GERD (gastroesophageal reflux disease)    on omeprazole  . History of Barrett's esophagus   . History of kidney stones    10-15 yrs ago  . Hyperlipidemia   . Prostate cancer [facility])     Past Surgical History:  Procedure Laterality Date  . COLONOSCOPY    . MOHS SURGERY Right    scalp  . PROSTATE SURGERY  2010  . SHOULDER ARTHROSCOPY  2007   right  . TOTAL HIP ARTHROPLASTY Left 05/10/2016   Procedure: TOTAL HIP ARTHROPLASTY;  Surgeon: Garald Balding, MD;  Location: [place];  Service: Orthopedics;  Laterality: Left;  . UPPER GASTROINTESTINAL ENDOSCOPY      There were no vitals filed for this visit.  Subjective Assessment - 05/11/17 0806    Subjective  Patient reports his back has still been a little sore. He has been driving a lot. He feels like it still may be from shoveling.     Limitations  House hold activities;Sitting    How long can you sit comfortably?  couple hours-has to shift and reposition    Diagnostic tests  MRI: protruding L5 disc    Patient Stated Goals  improve pain, return to golf/tennis    Currently  in Pain?  Yes    Pain Score  2     Pain Orientation  Right    Pain Type  Chronic pain    Pain Onset  More than a month ago    Pain Frequency  Intermittent    Aggravating Factors   not aware     Pain Relieving Factors  extension     Effect of Pain on Daily Activities  unable to gof n                      [facility] Adult PT Treatment/Exercise - 05/11/17 0001      Lumbar Exercises: Stretches   Active Hamstring Stretch Limitations  2x20 sec hold with strap     Prone on Elbows Stretch  1 rep;60 seconds    Piriformis Stretch  3 reps;30 seconds      Lumbar Exercises: Standing   Other Standing Lumbar Exercises  standing chop up and down red 2x10 each way with cuing for techique; Pallowf press 2x10 bilateral red; lateral band walks green band 2 x 10     Other Standing Lumbar Exercises  lateral band walk red 2x10  Lumbar Exercises: Supine   Bridge Limitations  2x10  low back tightness, not pain      Manual Therapy   Soft tissue mobilization  IASTYM to lumbar spine right more then left              PT Education - 05/11/17 0808    Education provided  Yes    Education Details  reviwed self soft tissue mobilization     Person(s) Educated  Patient    Methods  Explanation;Demonstration;Tactile cues;Verbal cues    Comprehension  Verbalized understanding;Returned demonstration;Verbal cues required;Tactile cues required          PT Long Term Goals - 05/03/17 0907      PT LONG TERM GOAL #1   Title  Pt will be independent with HEP for return to sport     Time  6    Period  Weeks    Status  On-going      PT LONG TERM GOAL #2   Title  Patient will demonstrate good core control with sport specific activity     Baseline  needs reinforcement with sports activity     Time  6    Period  Weeks    Status  On-going      PT LONG TERM GOAL #3   Title  Patient will return to tennis without increased radicular symptoms and without pain in the left hip    Status  Unable  to assess      PT LONG TERM GOAL #4   Title  Patient will report no pain wihle driving in order to perform work tasks     Status  Achieved      PT LONG TERM GOAL #5   Title  report 75% improvement in pain while driving for improved tolerance to work responsibilities    Time  6    Period  Weeks    Status  Achieved            Plan - 05/11/17 0809    Clinical Impression Statement  Therapy focused on manual therapy as well as sport specific training. he had no increase in pain with treatment. he flet the pain in the middle of his back had improved. Therapy encouraged him to continue with self soft tissue mobilization to his back.     Clinical Presentation  Stable    Clinical Decision Making  Low    Rehab Potential  Good    PT Frequency  2x / week    PT Treatment/Interventions  ADLs/Self Care Home Management;Cryotherapy;Electrical Stimulation;Moist Heat;Traction;Ultrasound;Neuromuscular re-education;Therapeutic exercise;Therapeutic activities;Functional mobility training;Stair training;Gait training;Patient/family education;Manual techniques;Passive range of motion;Taping;Dry needling    PT Next Visit Plan  standing core, check ext based HEP , ?traction       Patient will benefit from skilled therapeutic intervention in order to improve the following deficits and impairments:  Abnormal gait, Pain, Decreased strength, Difficulty walking, Decreased mobility, Decreased range of motion, Impaired flexibility, Postural dysfunction, Increased fascial restricitons, Increased muscle spasms  Visit Diagnosis: Acute left-sided low back pain with left-sided sciatica  Other abnormalities of gait and mobility     Problem List Patient Active Problem List   Diagnosis Date Noted  . Primary osteoarthritis of left hip 05/10/2016  . Status post total replacement of left hip 05/10/2016  . Headache(784.0) 05/27/2013  . Prostate cancer ([place]) 06/29/2010  . Acute esophagitis 08/15/2007  . MIXED  HYPERLIPIDEMIA 03/02/2007  . Allergic rhinitis 11/10/2006  . DEGENERATIVE DISC DISEASE, CERVICAL SPINE  11/10/2006  . [place] DISEASE, LUMBAR SPINE 11/10/2006    Carney Living PT DPT  05/11/2017, 1:29 PM  [facility] [address] [place], [place], [address] Phone: [phone]   Fax:  [phone]  Name: Daniel Booth MRN: [id number] Date of Birth: 05/01/1956

## 2017-05-18 ENCOUNTER — Encounter: Payer: Self-pay | Admitting: Physical Therapy

## 2017-05-18 ENCOUNTER — Ambulatory Visit: Payer: 59 | Admitting: Physical Therapy

## 2017-05-18 DIAGNOSIS — M5442 Lumbago with sciatica, left side: Secondary | ICD-10-CM | POA: Diagnosis not present

## 2017-05-18 DIAGNOSIS — R2689 Other abnormalities of gait and mobility: Secondary | ICD-10-CM

## 2017-05-18 NOTE — Therapy (Signed)
[place], [place], [address] Phone: [phone]   Fax:  [phone]  Physical Therapy Treatment  Patient Details  Name: Daniel Booth MRN: [id number] Date of Birth: 08-Sep-1955 Referring Provider: Dr Dorathy Kinsman    Encounter Date: 05/18/2017  PT End of Session - 05/18/17 0807    Visit Number  15    Number of Visits  20    Date for PT Re-Evaluation  06/05/17    Authorization Type  U[employer] 60 visit limit    PT Start Time  0800    PT Stop Time  0852    PT Time Calculation (min)  52 min    Activity Tolerance  Patient tolerated treatment well    Behavior During Therapy  [facility] for tasks assessed/performed       Past Medical History:  Diagnosis Date  . Allergy   . Arthritis   . Complication of anesthesia 2011   "vitals dropped with endoscopy"  . Diverticulitis   . Diverticulosis   . GERD (gastroesophageal reflux disease)    on omeprazole  . History of Barrett's esophagus   . History of kidney stones    10-15 yrs ago  . Hyperlipidemia   . Prostate cancer [facility])     Past Surgical History:  Procedure Laterality Date  . COLONOSCOPY    . MOHS SURGERY Right    scalp  . PROSTATE SURGERY  2010  . SHOULDER ARTHROSCOPY  2007   right  . TOTAL HIP ARTHROPLASTY Left 05/10/2016   Procedure: TOTAL HIP ARTHROPLASTY;  Surgeon: Garald Balding, MD;  Location: [place];  Service: Orthopedics;  Laterality: Left;  . UPPER GASTROINTESTINAL ENDOSCOPY      There were no vitals filed for this visit.  Subjective Assessment - 05/18/17 0804    Subjective  Patient was able to get out and play tennis on Sunday. He intially was just going to hit but he felyt good so he played a little. He reports minor sorenes in his back but overall it was pretty good. He had a little soreness in his anterior thigh but that improved too.     Limitations  House hold activities;Sitting    How long can you sit comfortably?  couple hours-has to  shift and reposition    Diagnostic tests  MRI: protruding L5 disc    Patient Stated Goals  improve pain, return to golf/tennis    Currently in Pain?  Yes    Pain Score  2     Pain Location  Back    Pain Orientation  Right    Pain Descriptors / Indicators  Aching    Pain Type  Acute pain    Pain Onset  More than a month ago    Pain Frequency  Intermittent    Aggravating Factors   not aware     Pain Relieving Factors  extension     Effect of Pain on Daily Activities  unable to go in,                       [facility] Adult PT Treatment/Exercise - 05/18/17 0001      Lumbar Exercises: Stretches   Active Hamstring Stretch Limitations  2x20 sec hold with strap     Lower Trunk Rotation Limitations  x10    Prone on Elbows Stretch  1 rep;60 seconds    Piriformis Stretch  3 reps;30 seconds      Lumbar Exercises:  Standing   Other Standing Lumbar Exercises  standing chop up and down red 2x10 each way with cuing for techique; Pallowf press 2x10 bilateral red; lateral band walks green band 2 x 10     Other Standing Lumbar Exercises  lateral band walk red 2x10       Lumbar Exercises: Supine   Clam  20 reps 2x10 green    Bridge Limitations  2x10  low back tightness, not pain    Other Supine Lumbar Exercises  leg press 40 lb 2x10 felt it with 60 lbs       Lumbar Exercises: Prone   Opposite Arm/Leg Raise  --      Cryotherapy   Number Minutes Cryotherapy  10 Minutes    Cryotherapy Location  Lumbar Spine             PT Education - 05/18/17 0807    Education provided  Yes    Education Details  reviewed soft tissue mbilizations     Person(s) Educated  Patient    Methods  Explanation;Tactile cues;Verbal cues;Demonstration    Comprehension  Verbalized understanding;Returned demonstration;Verbal cues required;Tactile cues required          PT Long Term Goals - 05/03/17 0907      PT LONG TERM GOAL #1   Title  Pt will be independent with HEP for return to sport     Time   6    Period  Weeks    Status  On-going      PT LONG TERM GOAL #2   Title  Patient will demonstrate good core control with sport specific activity     Baseline  needs reinforcement with sports activity     Time  6    Period  Weeks    Status  On-going      PT LONG TERM GOAL #3   Title  Patient will return to tennis without increased radicular symptoms and without pain in the left hip    Status  Unable to assess      PT LONG TERM GOAL #4   Title  Patient will report no pain wihle driving in order to perform work tasks     Status  Achieved      PT LONG TERM GOAL #5   Title  report 75% improvement in pain while driving for improved tolerance to work responsibilities    Time  6    Period  Weeks    Status  Achieved            Plan - 05/18/17 0808    Clinical Impression Statement  Patient tolerated treatment well. he continues to be able to tolerate higher level core exercises. He is back to sport. He has two more visits scheduled then he will likley D/C to HEP.     Clinical Presentation  Stable    Clinical Decision Making  Low    Rehab Potential  Good    PT Frequency  2x / week    PT Duration  6 weeks    PT Treatment/Interventions  ADLs/Self Care Home Management;Cryotherapy;Electrical Stimulation;Moist Heat;Traction;Ultrasound;Neuromuscular re-education;Therapeutic exercise;Therapeutic activities;Functional mobility training;Stair training;Gait training;Patient/family education;Manual techniques;Passive range of motion;Taping;Dry needling    PT Next Visit Plan  standing core, check ext based HEP , ?traction    PT Home Exercise Plan  prone press up, hamstring stretch, rolling with tennis ball, prone press up with sag, bird dog and prone hip ext     Consulted and Agree with Plan  of Care  Patient       Patient will benefit from skilled therapeutic intervention in order to improve the following deficits and impairments:  Abnormal gait, Pain, Decreased strength, Difficulty walking,  Decreased mobility, Decreased range of motion, Impaired flexibility, Postural dysfunction, Increased fascial restricitons, Increased muscle spasms  Visit Diagnosis: Acute left-sided low back pain with left-sided sciatica  Other abnormalities of gait and mobility     Problem List Patient Active Problem List   Diagnosis Date Noted  . Primary osteoarthritis of left hip 05/10/2016  . Status post total replacement of left hip 05/10/2016  . Headache(784.0) 05/27/2013  . Prostate cancer ([place]) 06/29/2010  . Acute esophagitis 08/15/2007  . MIXED HYPERLIPIDEMIA 03/02/2007  . Allergic rhinitis 11/10/2006  . DEGENERATIVE DISC DISEASE, CERVICAL SPINE 11/10/2006  . [place] DISEASE, LUMBAR SPINE 11/10/2006    Carney Living PT DPT  05/18/2017, 1:04 PM  [facility] [address] [place], [place], [address] Phone: [phone]   Fax:  [phone]  Name: Daniel Booth MRN: [id number] Date of Birth: March 17, 1956

## 2017-05-19 ENCOUNTER — Other Ambulatory Visit: Payer: Self-pay

## 2017-05-19 ENCOUNTER — Telehealth: Payer: Self-pay | Admitting: Gastroenterology

## 2017-05-19 MED ORDER — CIPROFLOXACIN HCL 500 MG PO TABS: 500.0000 mg | ORAL_TABLET | Freq: Two times a day (BID) | ORAL | 0 refills | Status: DC

## 2017-05-19 MED ORDER — METRONIDAZOLE 500 MG PO TABS: 500.0000 mg | ORAL_TABLET | Freq: Three times a day (TID) | ORAL | 0 refills | Status: DC

## 2017-05-19 NOTE — Telephone Encounter (Signed)
Daniel Booth, If this sounds like his prior diverticulitis flare I would recommend treating him empirically for it.  Let's give him flagyl 500mg  TID and ciprofloxacin 500mg  BID for 7 days. If no improvement or worsening symptoms on this regimen he needs to let us know or seek further evaluation. Thanks

## 2017-05-19 NOTE — Telephone Encounter (Signed)
Called patient back he is aware to go pick up medication and if worsening symptoms or no improvement will call back to office.

## 2017-05-19 NOTE — Telephone Encounter (Signed)
Patient states he has had LLQ pain x 2 days, no blood in stools or changes in bm's, no fever/sweats. States it feels like it did with previous diverticulitis flare ups.

## 2017-05-25 ENCOUNTER — Encounter: Payer: Self-pay | Admitting: Physical Therapy

## 2017-05-25 ENCOUNTER — Ambulatory Visit: Payer: 59 | Admitting: Physical Therapy

## 2017-05-25 DIAGNOSIS — R2689 Other abnormalities of gait and mobility: Secondary | ICD-10-CM

## 2017-05-25 DIAGNOSIS — M5442 Lumbago with sciatica, left side: Secondary | ICD-10-CM | POA: Diagnosis not present

## 2017-05-25 NOTE — Therapy (Signed)
[place], [place], [address] Phone: [phone]   Fax:  [phone]  Physical Therapy Treatment  Patient Details  Name: DEMETRIS CAPELL MRN: [id number] Date of Birth: Apr 08, 1956 Referring Provider: Dr Sherley Bounds    Encounter Date: 05/25/2017  PT End of Session - 05/25/17 0811    Visit Number  16    Number of Visits  20    Date for PT Re-Evaluation  06/05/17    Authorization Type  UHC 60 visit limit    PT Start Time  0800    PT Stop Time  0853    PT Time Calculation (min)  53 min    Activity Tolerance  Patient tolerated treatment well    Behavior During Therapy  [facility] for tasks assessed/performed       Past Medical History:  Diagnosis Date  . Allergy   . Arthritis   . Complication of anesthesia 2011   "vitals dropped with endoscopy"  . Diverticulitis   . Diverticulosis   . GERD (gastroesophageal reflux disease)    on omeprazole  . History of Barrett's esophagus   . History of kidney stones    10-15 yrs ago  . Hyperlipidemia   . Prostate cancer [facility])     Past Surgical History:  Procedure Laterality Date  . COLONOSCOPY    . MOHS SURGERY Right    scalp  . PROSTATE SURGERY  2010  . SHOULDER ARTHROSCOPY  2007   right  . TOTAL HIP ARTHROPLASTY Left 05/10/2016   Procedure: TOTAL HIP ARTHROPLASTY;  Surgeon: Garald Balding, MD;  Location: [place];  Service: Orthopedics;  Laterality: Left;  . UPPER GASTROINTESTINAL ENDOSCOPY      There were no vitals filed for this visit.  Subjective Assessment - 05/25/17 0808    Subjective  Patient has no complaints at this time. He feels minor stiffness at times. No radicualr pain at all.     Limitations  House hold activities;Sitting    How long can you sit comfortably?  couple hours-has to shift and reposition    Diagnostic tests  MRI: protruding L5 disc    Patient Stated Goals  improve pain, return to golf/tennis    Currently in Pain?  No/denies                       [facility] Adult PT Treatment/Exercise - 05/25/17 0001      Lumbar Exercises: Stretches   Active Hamstring Stretch Limitations  2x20 sec hold with strap     Lower Trunk Rotation Limitations  x10    Piriformis Stretch  --      Lumbar Exercises: Standing   Other Standing Lumbar Exercises  step onto sair-ex 20x forward and side to side; cone dsrill 2x10 each leg; cable walk x5 4 plates.     Other Standing Lumbar Exercises  lateral band walk green 2x10: step up with opposite hip flexion 6N62 clicking in lower back with left step. Improved when not moved through full range.       Lumbar Exercises: Supine   Bridge Limitations  2x10  low back tightness, not pain    Other Supine Lumbar Exercises  leg press 40 lb 2x10 felt it with 60 lbs       Cryotherapy   Number Minutes Cryotherapy  10 Minutes    Cryotherapy Location  Lumbar Spine             PT Education -  05/25/17 7425    Education provided  Yes    Education Details  reviewed home exercises     Person(s) Educated  Patient    Methods  Explanation;Demonstration;Tactile cues;Verbal cues    Comprehension  Verbalized understanding;Returned demonstration;Verbal cues required;Tactile cues required          PT Long Term Goals - 05/03/17 0907      PT LONG TERM GOAL #1   Title  Pt will be independent with HEP for return to sport     Time  6    Period  Weeks    Status  On-going      PT LONG TERM GOAL #2   Title  Patient will demonstrate good core control with sport specific activity     Baseline  needs reinforcement with sports activity     Time  6    Period  Weeks    Status  On-going      PT LONG TERM GOAL #3   Title  Patient will return to tennis without increased radicular symptoms and without pain in the left hip    Status  Unable to assess      PT LONG TERM GOAL #4   Title  Patient will report no pain wihle driving in order to perform work tasks     Status  Achieved      PT LONG TERM  GOAL #5   Title  report 75% improvement in pain while driving for improved tolerance to work responsibilities    Time  [place] - 05/25/17 9563    Clinical Impression Statement  Patient continues to tolerate exercises with no difficutly. He will come for 1 visit. He will then take a few weeks and perfrom HEP. He may come in for a follow up visit. If not he will likley D/C to HEP.  Therapy focused on lower extremity retun to sport exercises. he flet fatigued after but no increase in pain.,     Clinical Presentation  Stable    Clinical Decision Making  Low    Rehab Potential  Good    PT Frequency  2x / week    PT Duration  6 weeks    PT Treatment/Interventions  ADLs/Self Care Home Management;Cryotherapy;Electrical Stimulation;Moist Heat;Traction;Ultrasound;Neuromuscular re-education;Therapeutic exercise;Therapeutic activities;Functional mobility training;Stair training;Gait training;Patient/family education;Manual techniques;Passive range of motion;Taping;Dry needling    PT Next Visit Plan  standing core, check ext based HEP , ?traction    PT Home Exercise Plan  prone press up, hamstring stretch, rolling with tennis ball, prone press up with sag, bird dog and prone hip ext     Consulted and Agree with Plan of Care  Patient       Patient will benefit from skilled therapeutic intervention in order to improve the following deficits and impairments:  Abnormal gait, Pain, Decreased strength, Difficulty walking, Decreased mobility, Decreased range of motion, Impaired flexibility, Postural dysfunction, Increased fascial restricitons, Increased muscle spasms  Visit Diagnosis: Acute left-sided low back pain with left-sided sciatica  Other abnormalities of gait and mobility     Problem List Patient Active Problem List   Diagnosis Date Noted  . Primary osteoarthritis of left hip 05/10/2016  . Status post total replacement of left hip  05/10/2016  . Headache(784.0) 05/27/2013  . Prostate cancer ([place]) 06/29/2010  . Acute esophagitis 08/15/2007  . MIXED HYPERLIPIDEMIA 03/02/2007  .  Allergic rhinitis 11/10/2006  . DEGENERATIVE DISC DISEASE, CERVICAL SPINE 11/10/2006  . [place] DISEASE, LUMBAR SPINE 11/10/2006    Carney Living  PT DPT  05/25/2017, 3:42 PM  [facility] [address] [place], [place], [address] Phone: [phone]   Fax:  [phone]  Name: SHERON ROBIN MRN: [id number] Date of Birth: 18-Mar-1956

## 2017-06-01 ENCOUNTER — Encounter: Payer: Self-pay | Admitting: Physical Therapy

## 2017-06-01 ENCOUNTER — Ambulatory Visit: Payer: 59 | Admitting: Physical Therapy

## 2017-06-01 DIAGNOSIS — R2689 Other abnormalities of gait and mobility: Secondary | ICD-10-CM

## 2017-06-01 DIAGNOSIS — M5442 Lumbago with sciatica, left side: Secondary | ICD-10-CM | POA: Diagnosis not present

## 2017-06-01 NOTE — Therapy (Signed)
[place], [place], [address] Phone: [phone]   Fax:  [phone]  Physical Therapy Treatment  Patient Details  Name: Daniel Booth MRN: [id number] Date of Birth: 1956-01-13 Referring Provider: Dr Dorathy Kinsman    Encounter Date: 06/01/2017  PT End of Session - 06/01/17 0810    Visit Number  17    Number of Visits  20    Date for PT Re-Evaluation  06/05/17    Authorization Type  UHC 60 visit limit    PT Start Time  0803    PT Stop Time  0845    PT Time Calculation (min)  42 min    Activity Tolerance  Patient tolerated treatment well    Behavior During Therapy  [facility] for tasks assessed/performed       Past Medical History:  Diagnosis Date  . Allergy   . Arthritis   . Complication of anesthesia 2011   "vitals dropped with endoscopy"  . Diverticulitis   . Diverticulosis   . GERD (gastroesophageal reflux disease)    on omeprazole  . History of Barrett's esophagus   . History of kidney stones    10-15 yrs ago  . Hyperlipidemia   . Prostate cancer [facility])     Past Surgical History:  Procedure Laterality Date  . COLONOSCOPY    . MOHS SURGERY Right    scalp  . PROSTATE SURGERY  2010  . SHOULDER ARTHROSCOPY  2007   right  . TOTAL HIP ARTHROPLASTY Left 05/10/2016   Procedure: TOTAL HIP ARTHROPLASTY;  Surgeon: Garald Balding, MD;  Location: [place];  Service: Orthopedics;  Laterality: Left;  . UPPER GASTROINTESTINAL ENDOSCOPY      There were no vitals filed for this visit.  Subjective Assessment - 06/01/17 0808    Subjective  Patient reports a little soreness in his back but overall it is feeling good. He had just a little soreness after the last visit. He has not been able to play tennis recently but he pkans to play on Sunday.     Limitations  House hold activities;Sitting    How long can you sit comfortably?  couple hours-has to shift and reposition    Diagnostic tests  MRI: protruding L5  disc    Patient Stated Goals  improve pain, return to golf/tennis    Currently in Pain?  No/denies                      [name] Adult PT Treatment/Exercise - 06/01/17 0001      Lumbar Exercises: Stretches   Active Hamstring Stretch Limitations  2x20 sec hold with strap     Lower Trunk Rotation Limitations  x10    Piriformis Stretch  3 reps;30 seconds      Lumbar Exercises: Standing   Other Standing Lumbar Exercises  cable 2alk 5 plates x5 each direction;     Other Standing Lumbar Exercises  standing chop 3 plates 2x10 each direction; single leg stance 2x10       Lumbar Exercises: Supine   Clam  20 reps 2x10 green    Bridge Limitations  2x10  low back tightness, not pain    Other Supine Lumbar Exercises  leg press 40 lb 2x10 felt it with 60 lbs       Cryotherapy   Number Minutes Cryotherapy  10 Minutes    Cryotherapy Location  Lumbar Spine      Manual Therapy  Soft tissue mobilization  IASTYM to lumbar spine right more then left              PT Education - 06/01/17 0809    Education provided  Yes    Education Details  reviewed home exercises     Person(s) Educated  Patient    Methods  Explanation;Demonstration;Tactile cues;Verbal cues    Comprehension  Verbalized understanding;Returned demonstration;Verbal cues required;Tactile cues required          PT Long Term Goals - 06/01/17 1432      PT LONG TERM GOAL #1   Title  Pt will be independent with HEP for return to sport     Baseline  given today     Time  6    Period  Weeks    Status  On-going      PT LONG TERM GOAL #2   Title  Patient will demonstrate good core control with sport specific activity     Baseline  needs reinforcement with sports activity     Time  6    Period  Weeks    Status  On-going      PT LONG TERM GOAL #3   Title  Patient will return to tennis without increased radicular symptoms and without pain in the left hip    Time  6    Period  Weeks    Status  On-going       PT LONG TERM GOAL #4   Title  Patient will report no pain wihle driving in order to perform work tasks     Time  6    Period  Weeks    Status  Achieved      PT LONG TERM GOAL #5   Title  report 75% improvement in pain while driving for improved tolerance to work responsibilities    Baseline  no pain     Time  6    Period  Weeks    Status  Achieved            Plan - 06/01/17 1431    Clinical Impression Statement  Patient had some minor stiffness and pain with treatment today. He felt better after manual therapy. He was advised to stretch well before treatment.     Clinical Presentation  Stable    Clinical Decision Making  Low    Rehab Potential  Good    PT Frequency  2x / week    PT Duration  6 weeks    PT Treatment/Interventions  ADLs/Self Care Home Management;Cryotherapy;Electrical Stimulation;Moist Heat;Traction;Ultrasound;Neuromuscular re-education;Therapeutic exercise;Therapeutic activities;Functional mobility training;Stair training;Gait training;Patient/family education;Manual techniques;Passive range of motion;Taping;Dry needling    PT Next Visit Plan  standing core, check ext based HEP , ?traction    PT Home Exercise Plan  prone press up, hamstring stretch, rolling with tennis ball, prone press up with sag, bird dog and prone hip ext     Consulted and Agree with Plan of Care  Patient       Patient will benefit from skilled therapeutic intervention in order to improve the following deficits and impairments:  Abnormal gait, Pain, Decreased strength, Difficulty walking, Decreased mobility, Decreased range of motion, Impaired flexibility, Postural dysfunction, Increased fascial restricitons, Increased muscle spasms  Visit Diagnosis: Acute left-sided low back pain with left-sided sciatica  Other abnormalities of gait and mobility     Problem List Patient Active Problem List   Diagnosis Date Noted  . Primary osteoarthritis of left hip 05/10/2016  .  Status post total  replacement of left hip 05/10/2016  . Headache(784.0) 05/27/2013  . Prostate cancer ([place]) 06/29/2010  . Acute esophagitis 08/15/2007  . MIXED HYPERLIPIDEMIA 03/02/2007  . Allergic rhinitis 11/10/2006  . DEGENERATIVE DISC DISEASE, CERVICAL SPINE 11/10/2006  . [place] DISEASE, LUMBAR SPINE 11/10/2006    Carney Living PT DPT  06/01/2017, 2:34 PM  [facility] [address] [place], [place], [address] Phone: [phone]   Fax:  [phone]  Name: Daniel Booth MRN: [id number] Date of Birth: Feb 19, 1956

## 2017-06-14 ENCOUNTER — Encounter: Payer: Self-pay | Admitting: Physical Therapy

## 2017-06-14 ENCOUNTER — Ambulatory Visit: Payer: 59 | Attending: Orthopaedic Surgery | Admitting: Physical Therapy

## 2017-06-14 DIAGNOSIS — R2689 Other abnormalities of gait and mobility: Secondary | ICD-10-CM | POA: Diagnosis present

## 2017-06-14 DIAGNOSIS — M5442 Lumbago with sciatica, left side: Secondary | ICD-10-CM | POA: Diagnosis present

## 2017-06-14 NOTE — Therapy (Signed)
[place], [place], [address] Phone: [phone]   Fax:  [phone]  Physical Therapy Treatment  Patient Details  Name: Daniel Booth MRN: [id number] Date of Birth: [birth year] Referring Provider: Dr Dorathy Kinsman    Encounter Date: 06/14/2017  PT End of Session - 06/14/17 0808    Visit Number  18    Number of Visits  20    Date for PT Re-Evaluation  06/05/17    Authorization Type  [employer] 60 visit limit    PT Start Time  0804    PT Stop Time  0845    PT Time Calculation (min)  41 min    Activity Tolerance  Patient tolerated treatment well    Behavior During Therapy  [facility] for tasks assessed/performed       Past Medical History:  Diagnosis Date  . Allergy   . Arthritis   . Complication of anesthesia 2011   "vitals dropped with endoscopy"  . Diverticulitis   . Diverticulosis   . GERD (gastroesophageal reflux disease)    on omeprazole  . History of Barrett's esophagus   . History of kidney stones    10-15 yrs ago  . Hyperlipidemia   . Prostate cancer [facility])     Past Surgical History:  Procedure Laterality Date  . COLONOSCOPY    . MOHS SURGERY Right    scalp  . PROSTATE SURGERY  2010  . SHOULDER ARTHROSCOPY  2007   right  . TOTAL HIP ARTHROPLASTY Left 05/10/2016   Procedure: TOTAL HIP ARTHROPLASTY;  Surgeon: Garald Balding, MD;  Location: [facility];  Service: Orthopedics;  Laterality: Left;  . UPPER GASTROINTESTINAL ENDOSCOPY      There were no vitals filed for this visit.  Subjective Assessment - 06/14/17 0807    Subjective  Patient is having a little tightness in his lower right side of his back. he has been on a trip to [place].     Limitations  House hold activities;Sitting    How long can you sit comfortably?  couple hours-has to shift and reposition    Diagnostic tests  MRI: protruding L5 disc    Patient Stated Goals  improve pain, return to golf/tennis    Currently in Pain?   No/denies                      [facility] Adult PT Treatment/Exercise - 06/14/17 0001      Lumbar Exercises: Stretches   Active Hamstring Stretch Limitations  2x20 sec hold with strap     Lower Trunk Rotation Limitations  x10    Piriformis Stretch  3 reps;30 seconds      Lumbar Exercises: Supine   Clam  20 reps 2x10 green    Bridge Limitations  2x10  low back tightness, not pain      Cryotherapy   Number Minutes Cryotherapy  10 Minutes    Cryotherapy Location  Lumbar Spine      Manual Therapy   Soft tissue mobilization  IASTYM to lumbar spine right more then left              PT Education - 06/14/17 0807    Education provided  Yes    Education Details  reviewed exercises     Person(s) Educated  Patient    Methods  Explanation;Tactile cues;Demonstration;Verbal cues;Handout    Comprehension  Verbalized understanding;Returned demonstration;Verbal cues required;Tactile cues required  PT Long Term Goals - 06/14/17 0810      PT LONG TERM GOAL #1   Title  Pt will be independent with HEP for return to sport     Baseline  given today     Time  6    Period  Weeks    Status  Achieved      PT LONG TERM GOAL #2   Title  Patient will demonstrate good core control with sport specific activity     Baseline  independent with sport specific activity. Dmeonstrates good control     Time  6    Period  Weeks    Status  Achieved      PT LONG TERM GOAL #3   Title  Patient will return to tennis without increased radicular symptoms and without pain in the left hip    Baseline  has played a fe wtimes without pain     Time  6    Period  Weeks    Status  Achieved      PT LONG TERM GOAL #4   Title  Patient will report no pain wihle driving in order to perform work tasks     Time  6    Period  Weeks    Status  Achieved      PT LONG TERM GOAL #5   Title  report 75% improvement in pain while driving for improved tolerance to work responsibilities    Baseline   no pain     Time  6    Period  Weeks    Status  Achieved            Plan - 06/14/17 0809    Clinical Impression Statement  Patient is independent with exercise program. He has the tools he needs to keep himself out of pain. He was encouragd to continue with HEP at home. He also has a sport specific HEP.  He had some tightness today but he has a good plan to decrease his tightness. D/C to HEP    Clinical Presentation  Stable    Clinical Decision Making  Low    Rehab Potential  Good    PT Frequency  2x / week    PT Duration  6 weeks    PT Treatment/Interventions  ADLs/Self Care Home Management;Cryotherapy;Electrical Stimulation;Moist Heat;Traction;Ultrasound;Neuromuscular re-education;Therapeutic exercise;Therapeutic activities;Functional mobility training;Stair training;Gait training;Patient/family education;Manual techniques;Passive range of motion;Taping;Dry needling    PT Next Visit Plan  standing core, check ext based HEP , ?traction    PT Home Exercise Plan  prone press up, hamstring stretch, rolling with tennis ball, prone press up with sag, bird dog and prone hip ext     Consulted and Agree with Plan of Care  Patient       Patient will benefit from skilled therapeutic intervention in order to improve the following deficits and impairments:  Abnormal gait, Pain, Decreased strength, Difficulty walking, Decreased mobility, Decreased range of motion, Impaired flexibility, Postural dysfunction, Increased fascial restricitons, Increased muscle spasms  Visit Diagnosis: Acute left-sided low back pain with left-sided sciatica  Other abnormalities of gait and mobility  PHYSICAL THERAPY DISCHARGE SUMMARY  Visits from Start of Care: 18  Current functional level related to goals / functional outcomes: Back to playing tennis    Remaining deficits: Intermittent spasming    Education / Equipment: HEP   Plan: Patient agrees to discharge.  Patient goals were not met. Patient is  being discharged due to meeting the stated rehab  goals.  ?????       Problem List Patient Active Problem List   Diagnosis Date Noted  . Primary osteoarthritis of left hip 05/10/2016  . Status post total replacement of left hip 05/10/2016  . Headache(784.0) 05/27/2013  . Prostate cancer ([place]) 06/29/2010  . Acute esophagitis 08/15/2007  . MIXED HYPERLIPIDEMIA 03/02/2007  . Allergic rhinitis 11/10/2006  . DEGENERATIVE DISC DISEASE, CERVICAL SPINE 11/10/2006  . [place] DISEASE, LUMBAR SPINE 11/10/2006    Carney Living 06/14/2017, 1:14 PM  [facility] [address] [place], [place], [address] Phone: [phone]   Fax:  [phone]  Name: Daniel Booth MRN: [id number] Date of Birth: 1956/04/22

## 2017-06-23 ENCOUNTER — Encounter: Payer: Self-pay | Admitting: Family Medicine

## 2017-06-23 ENCOUNTER — Ambulatory Visit: Payer: 59 | Admitting: Family Medicine

## 2017-06-23 VITALS — BP 110/70 | HR 70 | Temp 98.1°F | Wt 189.3 lb

## 2017-06-23 DIAGNOSIS — R197 Diarrhea, unspecified: Secondary | ICD-10-CM | POA: Diagnosis not present

## 2017-06-23 DIAGNOSIS — J069 Acute upper respiratory infection, unspecified: Secondary | ICD-10-CM

## 2017-06-23 MED ORDER — IPRATROPIUM BROMIDE 0.06 % NA SOLN: 2.0000 | Freq: Four times a day (QID) | NASAL | 12 refills | Status: DC

## 2017-06-23 NOTE — Patient Instructions (Signed)
Food Choices to Help Relieve Diarrhea, Adult  When you have diarrhea, the foods you eat and your eating habits are very important. Choosing the right foods and drinks can help:  · Relieve diarrhea.  · Replace lost fluids and nutrients.  · Prevent dehydration.    What general guidelines should I follow?  Relieving diarrhea  · Choose foods with less than 2 g or .07 oz. of fiber per serving.  · Limit fats to less than 8 tsp (38 g or 1.34 oz.) a day.  · Avoid the following:  ? Foods and beverages sweetened with high-fructose corn syrup, honey, or sugar alcohols such as xylitol, sorbitol, and mannitol.  ? Foods that contain a lot of fat or sugar.  ? Fried, greasy, or spicy foods.  ? High-fiber grains, breads, and cereals.  ? Raw fruits and vegetables.  · Eat foods that are rich in probiotics. These foods include dairy products such as yogurt and fermented milk products. They help increase healthy bacteria in the stomach and intestines (gastrointestinal tract, or GI tract).  · If you have lactose intolerance, avoid dairy products. These may make your diarrhea worse.  · Take medicine to help stop diarrhea (antidiarrheal medicine) only as told by your health care provider.  Replacing nutrients  · Eat small meals or snacks every 3–4 hours.  · Eat bland foods, such as white rice, toast, or baked potato, until your diarrhea starts to get better. Gradually reintroduce nutrient-rich foods as tolerated or as told by your health care provider. This includes:  ? Well-cooked protein foods.  ? Peeled, seeded, and soft-cooked fruits and vegetables.  ? Low-fat dairy products.  · Take vitamin and mineral supplements as told by your health care provider.  Preventing dehydration    · Start by sipping water or a special solution to prevent dehydration (oral rehydration solution, ORS). Urine that is clear or pale yellow means that you are getting enough fluid.  · Try to drink at least 8–10 cups of fluid each day to help replace lost  fluids.  · You may add other liquids in addition to water, such as clear juice or decaffeinated sports drinks, as tolerated or as told by your health care provider.  · Avoid drinks with caffeine, such as coffee, tea, or soft drinks.  · Avoid alcohol.  What foods are recommended?  The items listed may not be a complete list. Talk with your health care provider about what dietary choices are best for you.  Grains  White rice. White, French, or pita breads (fresh or toasted), including plain rolls, buns, or bagels. White pasta. Saltine, soda, or graham crackers. Pretzels. Low-fiber cereal. Cooked cereals made with water (such as cornmeal, farina, or cream cereals). Plain muffins. Matzo. Melba toast. Zwieback.  Vegetables  Potatoes (without the skin). Most well-cooked and canned vegetables without skins or seeds. Tender lettuce.  Fruits  Apple sauce. Fruits canned in juice. Cooked apricots, cherries, grapefruit, peaches, pears, or plums. Fresh bananas and cantaloupe.  Meats and other protein foods  Baked or boiled chicken. Eggs. Tofu. Fish. Seafood. Smooth nut butters. Ground or well-cooked tender beef, ham, veal, lamb, pork, or poultry.  Dairy  Plain yogurt, kefir, and unsweetened liquid yogurt. Lactose-free milk, buttermilk, skim milk, or soy milk. Low-fat or nonfat hard cheese.  Beverages  Water. Low-calorie sports drinks. Fruit juices without pulp. Strained tomato and vegetable juices. Decaffeinated teas. Sugar-free beverages not sweetened with sugar alcohols. Oral rehydration solutions, if approved by your health care   provider.  Seasoning and other foods  Bouillon, broth, or soups made from recommended foods.  What foods are not recommended?  The items listed may not be a complete list. Talk with your health care provider about what dietary choices are best for you.  Grains  Whole grain, whole wheat, bran, or rye breads, rolls, pastas, and crackers. Wild or brown rice. Whole grain or bran cereals. Barley. Oats  and oatmeal. Corn tortillas or taco shells. Granola. Popcorn.  Vegetables  Raw vegetables. Fried vegetables. Cabbage, broccoli, Brussels sprouts, artichokes, baked beans, beet greens, corn, kale, legumes, peas, sweet potatoes, and yams. Potato skins. Cooked spinach and cabbage.  Fruits  Dried fruit, including raisins and dates. Raw fruits. Stewed or dried prunes. Canned fruits with syrup.  Meat and other protein foods  Fried or fatty meats. Deli meats. Chunky nut butters. Nuts and seeds. Beans and lentils. Bacon. Hot dogs. Sausage.  Dairy  High-fat cheeses. Whole milk, chocolate milk, and beverages made with milk, such as milk shakes. Half-and-half. Cream. sour cream. Ice cream.  Beverages  Caffeinated beverages (such as coffee, tea, soda, or energy drinks). Alcoholic beverages. Fruit juices with pulp. Prune juice. Soft drinks sweetened with high-fructose corn syrup or sugar alcohols. High-calorie sports drinks.  Fats and oils  Butter. Cream sauces. Margarine. Salad oils. Plain salad dressings. Olives. Avocados. Mayonnaise.  Sweets and desserts  Sweet rolls, doughnuts, and sweet breads. Sugar-free desserts sweetened with sugar alcohols such as xylitol and sorbitol.  Seasoning and other foods  Honey. Hot sauce. Chili powder. Gravy. Cream-based or milk-based soups. Pancakes and waffles.  Summary  · When you have diarrhea, the foods you eat and your eating habits are very important.  · Make sure you get at least 8–10 cups of fluid each day, or enough to keep your urine clear or pale yellow.  · Eat bland foods and gradually reintroduce healthy, nutrient-rich foods as tolerated, or as told by your health care provider.  · Avoid high-fiber, fried, greasy, or spicy foods.  This information is not intended to replace advice given to you by your health care provider. Make sure you discuss any questions you have with your health care provider.  Document Released: 07/09/2003 Document Revised: 04/15/2016 Document  Reviewed: 04/15/2016  Elsevier Interactive Patient Education © 2018 Elsevier Inc.

## 2017-06-23 NOTE — Progress Notes (Signed)
Subjective:     Patient ID: Daniel Booth, male   DOB: 10-17-1955, 62 y.o.   MRN: [id number]  HPI  Patient seen with several symptoms as below. He states about one week ago he developed some diarrhea along with low-grade fever and some sweats. He had some fairly severe diarrhea up to 15 watery nonbloody stools her day over the weekend but then symptoms seemed to get better. He was out of work Monday and Tuesday and seemed better by Wednesday. He then yesterday developed some recurrent diarrhea but not as severe as over the weekend. No recent antibiotics. No bloody stools. No travels.  Has taken some Pepto-Bismol. No abdominal pain.  He also in addition to the above has developed past few days some sore throat, cough, postnasal drip symptoms. Tried over-the-counter cough medicine with mild relief. He feels postnasal drip may be contributed to his cough.  Past Medical History:  Diagnosis Date  . Allergy   . Arthritis   . Complication of anesthesia 2011   "vitals dropped with endoscopy"  . Diverticulitis   . Diverticulosis   . GERD (gastroesophageal reflux disease)    on omeprazole  . History of Barrett's esophagus   . History of kidney stones    10-15 yrs ago  . Hyperlipidemia   . Prostate cancer [facility])    Past Surgical History:  Procedure Laterality Date  . COLONOSCOPY    . MOHS SURGERY Right    scalp  . PROSTATE SURGERY  2010  . SHOULDER ARTHROSCOPY  2007   right  . TOTAL HIP ARTHROPLASTY Left 05/10/2016   Procedure: TOTAL HIP ARTHROPLASTY;  Surgeon: Garald Balding, MD;  Location: [facility];  Service: Orthopedics;  Laterality: Left;  . UPPER GASTROINTESTINAL ENDOSCOPY      reports that he quit smoking about 35 years ago. His smoking use included cigarettes. He smoked 0.25 packs per day. he has never used smokeless tobacco. He reports that he drinks about 2.4 oz of alcohol per week. He reports that he does not use drugs. family history includes Colon polyps in his father; Diabetes  in his father; Melanoma in his maternal grandmother and mother. Allergies  Allergen Reactions  . No Known Allergies     Review of Systems  Constitutional: Positive for fatigue.  HENT: Positive for congestion, postnasal drip and rhinorrhea. Negative for sinus pain.   Eyes: Negative for visual disturbance.  Respiratory: Negative for cough, chest tightness and shortness of breath.   Cardiovascular: Negative for chest pain, palpitations and leg swelling.  Gastrointestinal: Positive for diarrhea. Negative for abdominal pain, blood in stool, nausea and vomiting.  Neurological: Negative for dizziness, syncope, weakness, light-headedness and headaches.       Objective:   Physical Exam  Constitutional: He appears well-developed and well-nourished.  HENT:  Right Ear: External ear normal.  Left Ear: External ear normal.  Mouth/Throat: Oropharynx is clear and moist.  Neck: Neck supple.  Cardiovascular: Normal rate and regular rhythm.  Pulmonary/Chest: Effort normal and breath sounds normal. No respiratory distress. He has no wheezes. He has no rales.  Abdominal: Soft. He exhibits no mass. There is no tenderness. There is no rebound and no guarding.  Lymphadenopathy:    He has no cervical adenopathy.       Assessment:     #1 nonbloody diarrhea. Suspect viral. Patient keeping down fluids and clinically does not appear dehydrated  #2 upper respiratory symptoms. These seem to be different from his diarrhea symptoms    Plan:     -  Handout on appropriate diet for diarrhea given -Consider short-term use of Imodium if necessary -Atrovent nasal spray 0.06% 1-2 sprays per nostril 4 times a day when necessary -Follow-up for any persistent diarrhea, fever, or other concerns  Eulas Post MD [facility]

## 2017-09-07 ENCOUNTER — Encounter: Payer: Self-pay | Admitting: Family Medicine

## 2017-09-29 ENCOUNTER — Other Ambulatory Visit: Payer: Self-pay | Admitting: Physician Assistant

## 2017-12-24 ENCOUNTER — Other Ambulatory Visit: Payer: Self-pay | Admitting: Adult Health

## 2018-04-03 ENCOUNTER — Other Ambulatory Visit: Payer: Self-pay | Admitting: Physician Assistant

## 2018-04-03 DIAGNOSIS — C4491 Basal cell carcinoma of skin, unspecified: Secondary | ICD-10-CM

## 2018-04-03 HISTORY — DX: Basal cell carcinoma of skin, unspecified: C44.91

## 2018-10-09 ENCOUNTER — Encounter: Payer: Self-pay | Admitting: Orthopaedic Surgery

## 2018-10-09 ENCOUNTER — Ambulatory Visit: Payer: Self-pay

## 2018-10-09 ENCOUNTER — Ambulatory Visit (INDEPENDENT_AMBULATORY_CARE_PROVIDER_SITE_OTHER): Payer: 59 | Admitting: Orthopaedic Surgery

## 2018-10-09 ENCOUNTER — Other Ambulatory Visit: Payer: Self-pay

## 2018-10-09 ENCOUNTER — Ambulatory Visit: Payer: 59 | Admitting: Orthopaedic Surgery

## 2018-10-09 ENCOUNTER — Encounter (INDEPENDENT_AMBULATORY_CARE_PROVIDER_SITE_OTHER): Payer: Self-pay

## 2018-10-09 VITALS — BP 126/92 | HR 74 | Ht 70.0 in | Wt 190.0 lb

## 2018-10-09 DIAGNOSIS — M25511 Pain in right shoulder: Secondary | ICD-10-CM

## 2018-10-09 DIAGNOSIS — G8929 Other chronic pain: Secondary | ICD-10-CM | POA: Diagnosis not present

## 2018-10-09 MED ORDER — BUPIVACAINE HCL 0.5 % IJ SOLN
2.0000 mL | INTRAMUSCULAR | Status: AC | PRN
  Administered 2018-10-09: 2 mL via INTRA_ARTICULAR

## 2018-10-09 MED ORDER — METHYLPREDNISOLONE ACETATE 40 MG/ML IJ SUSP
80.0000 mg | INTRAMUSCULAR | Status: AC | PRN
  Administered 2018-10-09: 80 mg via INTRA_ARTICULAR

## 2018-10-09 MED ORDER — LIDOCAINE HCL 2 % IJ SOLN
2.0000 mL | INTRAMUSCULAR | Status: AC | PRN
  Administered 2018-10-09: 11:00:00 2 mL

## 2018-10-09 NOTE — Progress Notes (Signed)
Office Visit Note   Patient: Daniel Booth           Date of Birth: Nov 25, 1955           MRN: [id number] Visit Date: 10/09/2018              Requested by: Dorothyann Peng, NP [place], [place] [address] PCP: Dorothyann Peng, NP   Assessment & Plan: Visit Diagnoses:  1. Chronic right shoulder pain     Plan: Impingement syndrome right shoulder.  We will try a subacromial cortisone injection and monitor response.  If no improvement in the next month would consider MRI scan with the possibility of a rotator cuff tear  Follow-Up Instructions: Return if symptoms worsen or fail to improve.   Orders:  Orders Placed This Encounter  Procedures  . Large Joint Inj: R subacromial bursa  . XR Shoulder Right   No orders of the defined types were placed in this encounter.     Procedures: Large Joint Inj: R subacromial bursa on 10/09/2018 11:22 AM Indications: pain and diagnostic evaluation Details: 25 G 1.5 in needle, anterolateral approach  Arthrogram: No  Medications: 2 mL lidocaine 2 %; 2 mL bupivacaine 0.5 %; 80 mg methylPREDNISolone acetate 40 MG/ML Consent was given by the patient. Immediately prior to procedure a time out was called to verify the correct patient, procedure, equipment, support staff and site/side marked as required. Patient was prepped and draped in the usual sterile fashion.       Clinical Data: No additional findings.   Subjective: Chief Complaint  Patient presents with  . Right Shoulder - Pain  Patient presents today with right shoulder pain X 3months. No Known injury. Patient states that he is active with tennis and golf, and he has a difficult time doing either of those. He has limited range of motion, and has noticed that it "cracks" at times. He takes tylenol daily. He is right hand dominant. He had arthroscopic surgery on the right side about 15years ago without any significant pain until the above onset.  Pain is localized to the  shoulder without referred pain.  No neck discomfort.  HPI  Review of Systems  Constitutional: Negative for fatigue.  HENT: Negative for ear pain.   Eyes: Negative for pain.  Respiratory: Negative for shortness of breath.   Cardiovascular: Negative for leg swelling.  Gastrointestinal: Negative for constipation and diarrhea.  Endocrine: Negative for cold intolerance and heat intolerance.  Genitourinary: Negative for difficulty urinating.  Musculoskeletal: Negative for joint swelling.  Skin: Negative for rash.  Allergic/Immunologic: Negative for food allergies.  Neurological: Positive for weakness.  Hematological: Does not bruise/bleed easily.  Psychiatric/Behavioral: Positive for sleep disturbance.     Objective: Vital Signs: BP (!) 126/92   Pulse 74   Ht 5\' 10"  (1.778 m)   Wt 190 lb (86.2 kg)   BMI 27.26 kg/m   Physical Exam Constitutional:      Appearance: He is well-developed.  Eyes:     Pupils: Pupils are equal, round, and reactive to light.  Pulmonary:     Effort: Pulmonary effort is normal.  Skin:    General: Skin is warm and dry.  Neurological:     Mental Status: He is alert and oriented to person, place, and time.  Psychiatric:        Behavior: Behavior normal.     Ortho Exam awake alert and oriented x3.  Comfortable sitting.  Painful overhead arc of motion but  no loss of motion.  Positive impingement and extreme of external rotation.  Biceps intact.  Skin intact.  Good strength.  Minimally positive empty can testing.  Some discomfort beneath the anterior subacromial region but no popping or clicking  Specialty Comments:  No specialty comments available.  Imaging: Xr Shoulder Right  Result Date: 10/09/2018 Films of the right shoulder obtained in several projections.  There is some ectopic calcification beneath the acromium between the humeral head and the lateral acromial edge probably consistent with his old surgery over 15 years ago no significant  degenerative changes at the [facility] joint.  No acute changes    PMFS History: Patient Active Problem List   Diagnosis Date Noted  . Primary osteoarthritis of left hip 05/10/2016  . Status post total replacement of left hip 05/10/2016  . Headache(784.0) 05/27/2013  . Prostate cancer ([place]) 06/29/2010  . Acute esophagitis 08/15/2007  . MIXED HYPERLIPIDEMIA 03/02/2007  . Allergic rhinitis 11/10/2006  . DEGENERATIVE DISC DISEASE, CERVICAL SPINE 11/10/2006  . DEGENERATIVE DISC DISEASE, LUMBAR SPINE 11/10/2006   Past Medical History:  Diagnosis Date  . Allergy   . Arthritis   . Complication of anesthesia 2011   "vitals dropped with endoscopy"  . Diverticulitis   . Diverticulosis   . GERD (gastroesophageal reflux disease)    on omeprazole  . History of Barrett's esophagus   . History of kidney stones    10-15 yrs ago  . Hyperlipidemia   . Prostate cancer [facility])     Family History  Problem Relation Age of Onset  . Colon polyps Father   . Diabetes Father   . Melanoma Mother   . Melanoma Maternal Grandmother   . Colon cancer Neg Hx   . Esophageal cancer Neg Hx   . Rectal cancer Neg Hx   . Stomach cancer Neg Hx     Past Surgical History:  Procedure Laterality Date  . COLONOSCOPY    . MOHS SURGERY Right    scalp  . PROSTATE SURGERY  2010  . SHOULDER ARTHROSCOPY  2007   right  . TOTAL HIP ARTHROPLASTY Left 05/10/2016   Procedure: TOTAL HIP ARTHROPLASTY;  Surgeon: Garald Balding, MD;  Location: [facility];  Service: Orthopedics;  Laterality: Left;  . UPPER GASTROINTESTINAL ENDOSCOPY     Social History   Occupational History  . Not on file  Tobacco Use  . Smoking status: Former Smoker    Packs/day: 0.25    Types: Cigarettes    Last attempt to quit: 04/27/1982    Years since quitting: 36.4  . Smokeless tobacco: Never Used  Substance and Sexual Activity  . Alcohol use: Yes    Alcohol/week: 4.0 standard drinks    Types: 4 Glasses of wine per week  . Drug use: No  . Sexual  activity: Not on file

## 2019-02-26 ENCOUNTER — Other Ambulatory Visit: Payer: Self-pay | Admitting: Adult Health

## 2019-02-28 NOTE — Telephone Encounter (Signed)
Denied.  Need cpx and fasting lab work.

## 2019-04-04 ENCOUNTER — Other Ambulatory Visit: Payer: Self-pay | Admitting: Adult Health

## 2019-04-04 NOTE — Telephone Encounter (Signed)
Medication Refill - Medication: simvastatin   Has the patient contacted their pharmacy? Yes.   Pt called stating he is completely out of the medication. Please advise.  (Agent: If no, request that the patient contact the pharmacy for the refill.) (Agent: If yes, when and what did the pharmacy advise?)  Preferred Pharmacy (with phone number or street name):  [id number] - [place], [place]. [address]. [place] [address]  Phone: [phone] Fax: [phone]  Not a 24 hour pharmacy; exact hours not known.     Agent: Please be advised that RX refills may take up to 3 business days. We ask that you follow-up with your pharmacy.

## 2019-04-04 NOTE — Telephone Encounter (Signed)
See below request

## 2019-04-04 NOTE — Telephone Encounter (Signed)
Requested medication (s) are due for refill today: yes  Requested medication (s) are on the active medication list: yes  Last refill:  12/24/2017  Future visit scheduled: no  Notes to clinic:  Overdue for refill Review for refill   Requested Prescriptions  Pending Prescriptions Disp Refills   simvastatin (ZOCOR) 20 MG tablet 90 tablet 3     Cardiovascular:  Antilipid - Statins Failed - 04/04/2019 11:56 AM      Failed - Total Cholesterol in normal range and within 360 days    Cholesterol  Date Value Ref Range Status  12/07/2016 234 (H) 0 - 200 mg/dL Final    Comment:    ATP III Classification       Desirable:  < 200 mg/dL               Borderline High:  200 - 239 mg/dL          High:  > = 240 mg/dL         Failed - LDL in normal range and within 360 days    LDL Cholesterol  Date Value Ref Range Status  12/07/2016 138 (H) 0 - 99 mg/dL Final         Failed - HDL in normal range and within 360 days    HDL  Date Value Ref Range Status  12/07/2016 64.00 >39.00 mg/dL Final         Failed - Triglycerides in normal range and within 360 days    Triglycerides  Date Value Ref Range Status  12/07/2016 162.0 (H) 0.0 - 149.0 mg/dL Final    Comment:    Normal:  <150 mg/dLBorderline High:  150 - 199 mg/dL         Failed - Valid encounter within last 12 months    Recent Outpatient Visits          1 year ago Diarrhea, unspecified type   [facility], Alinda Sierras, MD   2 years ago Routine general medical examination at a health care facility   [facility] [place], [place], NP   2 years ago Encounter to establish care   [facility] [facility], [place], NP   3 years ago Mixed hyperlipidemia   [facility] at [facility], Jory Ee, MD   5 years ago [employer])   [facility]rap[facility] at [facility], Jory Ee, MD             Passed - Patient is not pregnant

## 2019-04-05 NOTE — Telephone Encounter (Signed)
Denied.  Not seen since 2018.

## 2019-04-09 ENCOUNTER — Other Ambulatory Visit: Payer: Self-pay | Admitting: Physician Assistant

## 2019-04-15 ENCOUNTER — Telehealth: Payer: Self-pay | Admitting: Adult Health

## 2019-04-17 ENCOUNTER — Other Ambulatory Visit: Payer: Self-pay

## 2019-04-17 MED ORDER — SIMVASTATIN 20 MG PO TABS: ORAL_TABLET | ORAL | 0 refills | Status: DC

## 2019-04-17 NOTE — Telephone Encounter (Signed)
Left message for patient to return call to office. 

## 2019-04-17 NOTE — Telephone Encounter (Signed)
Pt calling back to check status. Pt would like a call back from the nurse to discuss denial. Please advise

## 2019-04-17 NOTE — Telephone Encounter (Signed)
DENIED.  PT IS PAST DUE FOR CPX AND LAB WORK. 

## 2019-04-17 NOTE — Telephone Encounter (Signed)
Pt has been scheduled for CPE. Rx approved by CMA and sent in for 30days. No further action needed.

## 2019-04-24 ENCOUNTER — Other Ambulatory Visit: Payer: Self-pay

## 2019-04-25 ENCOUNTER — Encounter: Payer: Self-pay | Admitting: Adult Health

## 2019-04-25 ENCOUNTER — Ambulatory Visit (INDEPENDENT_AMBULATORY_CARE_PROVIDER_SITE_OTHER): Payer: 59 | Admitting: Adult Health

## 2019-04-25 VITALS — BP 110/82 | Temp 97.7°F | Ht 70.0 in | Wt 201.0 lb

## 2019-04-25 DIAGNOSIS — Z8546 Personal history of malignant neoplasm of prostate: Secondary | ICD-10-CM | POA: Diagnosis not present

## 2019-04-25 DIAGNOSIS — Z0000 Encounter for general adult medical examination without abnormal findings: Secondary | ICD-10-CM

## 2019-04-25 DIAGNOSIS — E782 Mixed hyperlipidemia: Secondary | ICD-10-CM

## 2019-04-25 DIAGNOSIS — Z23 Encounter for immunization: Secondary | ICD-10-CM | POA: Diagnosis not present

## 2019-04-25 DIAGNOSIS — K219 Gastro-esophageal reflux disease without esophagitis: Secondary | ICD-10-CM

## 2019-04-25 DIAGNOSIS — N5231 Erectile dysfunction following radical prostatectomy: Secondary | ICD-10-CM

## 2019-04-25 LAB — COMPREHENSIVE METABOLIC PANEL
ALT: 23 U/L (ref 0–53)
AST: 23 U/L (ref 0–37)
Albumin: 4.6 g/dL (ref 3.5–5.2)
Alkaline Phosphatase: 84 U/L (ref 39–117)
BUN: 22 mg/dL (ref 6–23)
CO2: 30 mEq/L (ref 19–32)
Calcium: 9.4 mg/dL (ref 8.4–10.5)
Chloride: 105 mEq/L (ref 96–112)
Creatinine, Ser: 0.84 mg/dL (ref 0.40–1.50)
GFR: 92.21 mL/min (ref >60.00)
Glucose, Bld: 105 mg/dL — ABNORMAL HIGH (ref 70–99)
Potassium: 4.7 mEq/L (ref 3.5–5.1)
Sodium: 142 mEq/L (ref 135–145)
Total Bilirubin: 0.5 mg/dL (ref 0.2–1.2)
Total Protein: 6.6 g/dL (ref 6.0–8.3)

## 2019-04-25 LAB — LIPID PANEL
Cholesterol: 218 mg/dL — ABNORMAL HIGH (ref 0–200)
HDL: 67.2 mg/dL (ref >39.00)
LDL Cholesterol: 133 mg/dL — ABNORMAL HIGH (ref 0–99)
NonHDL: 151.05
Total CHOL/HDL Ratio: 3
Triglycerides: 90 mg/dL (ref 0.0–149.0)
VLDL: 18 mg/dL (ref 0.0–40.0)

## 2019-04-25 LAB — CBC WITH DIFFERENTIAL/PLATELET
Basophils Absolute: 0 10*3/uL (ref 0.0–0.1)
Basophils Relative: 0.2 % (ref 0.0–3.0)
Eosinophils Absolute: 0.1 10*3/uL (ref 0.0–0.7)
Eosinophils Relative: 2.4 % (ref 0.0–5.0)
HCT: 44.2 % (ref 39.0–52.0)
Hemoglobin: 14.7 g/dL (ref 13.0–17.0)
Lymphocytes Relative: 22.4 % (ref 12.0–46.0)
Lymphs Abs: 1.4 10*3/uL (ref 0.7–4.0)
MCHC: 33.3 g/dL (ref 30.0–36.0)
MCV: 93.4 fl (ref 78.0–100.0)
Monocytes Absolute: 0.4 10*3/uL (ref 0.1–1.0)
Monocytes Relative: 7.3 % (ref 3.0–12.0)
Neutro Abs: 4.1 10*3/uL (ref 1.4–7.7)
Neutrophils Relative %: 67.7 % (ref 43.0–77.0)
Platelets: 299 10*3/uL (ref 150.0–400.0)
RBC: 4.73 Mil/uL (ref 4.22–5.81)
RDW: 12.5 % (ref 11.5–15.5)
WBC: 6.1 10*3/uL (ref 4.0–10.5)

## 2019-04-25 LAB — TSH: TSH: 1.23 u[IU]/mL (ref 0.35–4.50)

## 2019-04-25 MED ORDER — OMEPRAZOLE 20 MG PO CPDR: 20.0000 mg | DELAYED_RELEASE_CAPSULE | Freq: Every day | ORAL | 3 refills | Status: DC

## 2019-04-25 MED ORDER — TADALAFIL 5 MG PO TABS: 5.0000 mg | ORAL_TABLET | Freq: Every day | ORAL | 6 refills | Status: DC

## 2019-04-25 NOTE — Addendum Note (Signed)
Addended by: Miles Costain T on: 04/25/2019 10:40 AM   Modules accepted: Orders

## 2019-04-25 NOTE — Patient Instructions (Signed)
It was great seeing you today   I will follow up with you regarding your blood work   Please work on weight loss through diet and exercise    Health Maintenance, Male A healthy lifestyle and preventative care can promote health and wellness.  Maintain regular health, dental, and eye exams.  Eat a healthy diet. Foods like vegetables, fruits, whole grains, low-fat dairy products, and lean protein foods contain the nutrients you need and are low in calories. Decrease your intake of foods high in solid fats, added sugars, and salt. Get information about a proper diet from your health care provider, if necessary.  Regular physical exercise is one of the most important things you can do for your health. Most adults should get at least 150 minutes of moderate-intensity exercise (any activity that increases your heart rate and causes you to sweat) each week. In addition, most adults need muscle-strengthening exercises on 2 or more days a week.   Maintain a healthy weight. The body mass index (BMI) is a screening tool to identify possible weight problems. It provides an estimate of body fat based on height and weight. Your health care provider can find your BMI and can help you achieve or maintain a healthy weight. For males 20 years and older:  A BMI below 18.5 is considered underweight.  A BMI of 18.5 to 24.9 is normal.  A BMI of 25 to 29.9 is considered overweight.  A BMI of 30 and above is considered obese.  Maintain normal blood lipids and cholesterol by exercising and minimizing your intake of saturated fat. Eat a balanced diet with plenty of fruits and vegetables. Blood tests for lipids and cholesterol should begin at age 39 and be repeated every 5 years. If your lipid or cholesterol levels are high, you are over age 26, or you are at high risk for heart disease, you may need your cholesterol levels checked more frequently.Ongoing high lipid and cholesterol levels should be treated with  medicines if diet and exercise are not working.  If you smoke, find out from your health care provider how to quit. If you do not use tobacco, do not start.  Lung cancer screening is recommended for adults aged 24-80 years who are at high risk for developing lung cancer because of a history of smoking. A yearly low-dose CT scan of the lungs is recommended for people who have at least a 30-pack-year history of smoking and are current smokers or have quit within the past 15 years. A pack year of smoking is smoking an average of 1 pack of cigarettes a day for 1 year (for example, a 30-pack-year history of smoking could mean smoking 1 pack a day for 30 years or 2 packs a day for 15 years). Yearly screening should continue until the smoker has stopped smoking for at least 15 years. Yearly screening should be stopped for people who develop a health problem that would prevent them from having lung cancer treatment.  If you choose to drink alcohol, do not have more than 2 drinks per day. One drink is considered to be 12 oz (360 mL) of beer, 5 oz (150 mL) of wine, or 1.5 oz (45 mL) of liquor.  Avoid the use of street drugs. Do not share needles with anyone. Ask for help if you need support or instructions about stopping the use of drugs.  High blood pressure causes heart disease and increases the risk of stroke. High blood pressure is more likely to  develop in:  People who have blood pressure in the end of the normal range (100-139/85-89 mm Hg).  People who are overweight or obese.  People who are African American.  If you are 31-28 years of age, have your blood pressure checked every 3-5 years. If you are 81 years of age or older, have your blood pressure checked every year. You should have your blood pressure measured twice--once when you are at a hospital or clinic, and once when you are not at a hospital or clinic. Record the average of the two measurements. To check your blood pressure when you are not  at a hospital or clinic, you can use:  An automated blood pressure machine at a pharmacy.  A home blood pressure monitor.  If you are 69-6 years old, ask your health care provider if you should take aspirin to prevent heart disease.  Diabetes screening involves taking a blood sample to check your fasting blood sugar level. This should be done once every 3 years after age 5 if you are at a normal weight and without risk factors for diabetes. Testing should be considered at a younger age or be carried out more frequently if you are overweight and have at least 1 risk factor for diabetes.  Colorectal cancer can be detected and often prevented. Most routine colorectal cancer screening begins at the age of 106 and continues through age 37. However, your health care provider may recommend screening at an earlier age if you have risk factors for colon cancer. On a yearly basis, your health care provider may provide home test kits to check for hidden blood in the stool. A small camera at the end of a tube may be used to directly examine the colon (sigmoidoscopy or colonoscopy) to detect the earliest forms of colorectal cancer. Talk to your health care provider about this at age 82 when routine screening begins. A direct exam of the colon should be repeated every 5-10 years through age 75, unless early forms of precancerous polyps or small growths are found.  People who are at an increased risk for hepatitis B should be screened for this virus. You are considered at high risk for hepatitis B if:  You were born in a country where hepatitis B occurs often. Talk with your health care provider about which countries are considered high risk.  Your parents were born in a high-risk country and you have not received a shot to protect against hepatitis B (hepatitis B vaccine).  You have HIV or AIDS.  You use needles to inject street drugs.  You live with, or have sex with, someone who has hepatitis B.  You  are a man who has sex with other men (MSM).  You get hemodialysis treatment.  You take certain medicines for conditions like cancer, organ transplantation, and autoimmune conditions.  Hepatitis C blood testing is recommended for all people born from [birth year] through 1965 and any individual with known risk factors for hepatitis C.  Healthy men should no longer receive prostate-specific antigen (PSA) blood tests as part of routine cancer screening. Talk to your health care provider about prostate cancer screening.  Testicular cancer screening is not recommended for adolescents or adult males who have no symptoms. Screening includes self-exam, a health care provider exam, and other screening tests. Consult with your health care provider about any symptoms you have or any concerns you have about testicular cancer.  Practice safe sex. Use condoms and avoid high-risk sexual practices to reduce  the spread of sexually transmitted infections (STIs).  You should be screened for STIs, including gonorrhea and chlamydia if:  You are sexually active and are younger than 24 years.  You are older than 24 years, and your health care provider tells you that you are at risk for this type of infection.  Your sexual activity has changed since you were last screened, and you are at an increased risk for chlamydia or gonorrhea. Ask your health care provider if you are at risk.  If you are at risk of being infected with HIV, it is recommended that you take a prescription medicine daily to prevent HIV infection. This is called pre-exposure prophylaxis (PrEP). You are considered at risk if:  You are a man who has sex with other men (MSM).  You are a heterosexual man who is sexually active with multiple partners.  You take drugs by injection.  You are sexually active with a partner who has HIV.  Talk with your health care provider about whether you are at high risk of being infected with HIV. If you choose to begin  PrEP, you should first be tested for HIV. You should then be tested every 3 months for as long as you are taking PrEP.  Use sunscreen. Apply sunscreen liberally and repeatedly throughout the day. You should seek shade when your shadow is shorter than you. Protect yourself by wearing long sleeves, pants, a wide-brimmed hat, and sunglasses year round whenever you are outdoors.  Tell your health care provider of new moles or changes in moles, especially if there is a change in shape or color. Also, tell your health care provider if a mole is larger than the size of a pencil eraser.  A one-time screening for abdominal aortic aneurysm (AAA) and surgical repair of large AAAs by ultrasound is recommended for men aged 78-75 years who are current or former smokers.  Stay current with your vaccines (immunizations).   This information is not intended to replace advice given to you by your health care provider. Make sure you discuss any questions you have with your health care provider.   Document Released: 10/15/2007 Document Revised: 05/09/2014 Document Reviewed: 09/13/2010 Elsevier Interactive Patient Education [employer].

## 2019-04-25 NOTE — Progress Notes (Signed)
Subjective:    Patient ID: Daniel Booth, male    DOB: [birth year], 63 y.o.   MRN: [id number]  HPI Patient presents for yearly preventative medicine examination. He is a pleasant 63 year old male who  has a past medical history of Allergy, Arthritis, Complication of anesthesia (2011), Diverticulitis, Diverticulosis, GERD (gastroesophageal reflux disease), History of Barrett's esophagus, History of kidney stones, Hyperlipidemia, and Prostate cancer ([place]).  He has not been seen since 11/2016 . Since the last time we saw each other he had lumbar disk laminectomy - is now pain free  Hyperlipidemia - Takes simvastatin 20 mg. He denies myalgia or fatigue.  Lab Results  Component Value Date   CHOL 234 (H) 12/07/2016   HDL 64.00 12/07/2016   LDLCALC 138 (H) 12/07/2016   LDLDIRECT 148.0 06/07/2006   TRIG 162.0 (H) 12/07/2016   CHOLHDL 4 12/07/2016    GERD- controlled with omeprazole.   ED- takes cialis as needed  H/o Prostate cancer -had prostatectomy in 2010. Is seen by Urology on a yearly basis still.   All immunizations and health maintenance protocols were reviewed with the patient and needed orders were placed. He is due for flu vaccination   Appropriate screening laboratory values were ordered for the patient including screening of hyperlipidemia, renal function and hepatic function.  Medication reconciliation,  past medical history, social history, problem list and allergies were reviewed in detail with the patient  Goals were established with regard to weight loss, exercise, and  diet in compliance with medications. He continues to golf and plays tennis once a week. He is trying to eat healthy.   Wt Readings from Last 3 Encounters:  04/25/19 201 lb (91.2 kg)  10/09/18 190 lb (86.2 kg)  06/23/17 189 lb 4.8 oz (85.9 kg)    End of life planning was discussed. He has an advanced directive and living will.   He has no acute issues today   Review of Systems  Constitutional:  Negative.   HENT: Negative.   Eyes: Negative.   Respiratory: Negative.   Cardiovascular: Negative.   Gastrointestinal: Negative.   Endocrine: Negative.   Genitourinary: Negative.   Musculoskeletal: Negative.   Skin: Negative.   Allergic/Immunologic: Negative.   Neurological: Negative.   Hematological: Negative.   Psychiatric/Behavioral: Negative.   All other systems reviewed and are negative.  Past Medical History:  Diagnosis Date  . Allergy   . Arthritis   . Complication of anesthesia 2011   "vitals dropped with endoscopy"  . Diverticulitis   . Diverticulosis   . GERD (gastroesophageal reflux disease)    on omeprazole  . History of Barrett's esophagus   . History of kidney stones    10-15 yrs ago  . Hyperlipidemia   . Prostate cancer [facility])     Social History   Socioeconomic History  . Marital status: Married    Spouse name: Not on file  . Number of children: Not on file  . Years of education: Not on file  . Highest education level: Not on file  Occupational History  . Not on file  Tobacco Use  . Smoking status: Former Smoker    Packs/day: 0.25    Types: Cigarettes    Quit date: 04/27/1982    Years since quitting: 37.0  . Smokeless tobacco: Never Used  Substance and Sexual Activity  . Alcohol use: Yes    Alcohol/week: 4.0 standard drinks    Types: 4 Glasses of wine per week  . Drug use:  No  . Sexual activity: Not on file  Other Topics Concern  . 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 Difficulty of Paying Living Expenses: Not on file  Food Insecurity:   . Worried About Charity fundraiser in the Last Year: Not on file  . Ran Out of Food in the Last Year: Not on file  Transportation Needs:   . Lack of Transportation (Medical): Not on file  . Lack of Transportation (Non-Medical): Not on file  Physical Activity:    . Days of Exercise per Week: Not on file  . Minutes of Exercise per Session: Not on file  Stress:   . Feeling of Stress : Not on file  Social Connections:   . Frequency of Communication with Friends and Family: Not on file  . Frequency of Social Gatherings with Friends and Family: Not on file  . Attends Religious Services: Not on file  . Active Member of Clubs or Organizations: Not on file  . Attends Archivist Meetings: Not on file  . Marital Status: Not on file  Intimate Partner Violence:   . Fear of Current or Ex-Partner: Not on file  . Emotionally Abused: Not on file  . Physically Abused: Not on file  . Sexually Abused: Not on file    Past Surgical History:  Procedure Laterality Date  . COLONOSCOPY    . MOHS SURGERY Right    scalp  . PROSTATE SURGERY  2010  . SHOULDER ARTHROSCOPY  2007   right  . TOTAL HIP ARTHROPLASTY Left 05/10/2016   Procedure: TOTAL HIP ARTHROPLASTY;  Surgeon: Garald Balding, MD;  Location: [place];  Service: Orthopedics;  Laterality: Left;  . UPPER GASTROINTESTINAL ENDOSCOPY      Family History  Problem Relation Age of Onset  . Colon polyps Father   . Diabetes Father   . Melanoma Mother   . Melanoma Maternal Grandmother   . Colon cancer Neg Hx   . Esophageal cancer Neg Hx   . Rectal cancer Neg Hx   . Stomach cancer Neg Hx     Allergies  Allergen Reactions  . No Known Allergies     Current Outpatient Medications on File Prior to Visit  Medication Sig Dispense Refill  . acetaminophen (TYLENOL) 500 MG tablet Take 1-2 tablets (500-1,000 mg total) by mouth every 6 (six) hours as needed. 30 tablet 0  . aspirin 81 MG tablet Take 81 mg by mouth daily.    . Calcium-Magnesium (CAL-MAG PO) Take 1 tablet by mouth daily.    . Coenzyme Q10 (CO Q10) 60 MG CAPS Take 1 tablet by mouth daily.    . fluticasone (FLONASE) 50 MCG/ACT nasal spray USE AS DIRECTED 32 g 6  . loratadine (CLARITIN) 10 MG tablet Take 10 mg by mouth daily.      . Multiple  Vitamin (MULTIVITAMIN) tablet Take 1 tablet by mouth daily.    . Omega-3 1000 MG CAPS Take 1 capsule by mouth daily.    Marland Kitchen omeprazole (PRILOSEC) 20 MG capsule Take 1 capsule (20 mg total) by mouth daily. 90 capsule 3  . simvastatin (ZOCOR) 20 MG tablet TAKE 1 TABLET BY MOUTH EVERYDAY AT BEDTIME 30 tablet 0  . tadalafil (CIALIS) 5 MG tablet Take 1 tablet (5 mg  total) by mouth daily. as directed 30 tablet 6   No current facility-administered medications on file prior to visit.    BP 110/82   Temp 97.7 F (36.5 C)   Ht 5\' 10"  (1.778 m)   Wt 201 lb (91.2 kg)   BMI 28.84 kg/m       Objective:   Physical Exam Vitals and nursing note reviewed.  Constitutional:      Appearance: Normal appearance.  HENT:     Right Ear: Tympanic membrane, ear canal and external ear normal. There is no impacted cerumen.     Left Ear: Tympanic membrane, ear canal and external ear normal. There is no impacted cerumen.     Nose: Nose normal. No congestion or rhinorrhea.     Mouth/Throat:     Mouth: Mucous membranes are moist.     Pharynx: Oropharynx is clear.  Cardiovascular:     Rate and Rhythm: Normal rate and regular rhythm.     Pulses: Normal pulses.     Heart sounds: Normal heart sounds. No murmur. No friction rub. No gallop.   Pulmonary:     Effort: Pulmonary effort is normal. No respiratory distress.     Breath sounds: Normal breath sounds. No stridor. No wheezing, rhonchi or rales.  Chest:     Chest wall: No tenderness.  Abdominal:     General: Abdomen is flat. Bowel sounds are normal. There is no distension.     Palpations: Abdomen is soft. There is no mass.     Tenderness: There is no abdominal tenderness. There is no right CVA tenderness, left CVA tenderness, guarding or rebound.     Hernia: No hernia is present.  Musculoskeletal:        General: No swelling, tenderness, deformity or signs of injury. Normal range of motion.     Right lower leg: No edema.     Left lower leg: No edema.   Skin:    General: Skin is warm and dry.     Coloration: Skin is not jaundiced or pale.     Findings: No bruising, erythema, lesion or rash.  Neurological:     General: No focal deficit present.     Mental Status: He is alert and oriented to person, place, and time.  Psychiatric:        Mood and Affect: Mood normal.        Behavior: Behavior normal.        Thought Content: Thought content normal.        Judgment: Judgment normal.       Assessment & Plan:  1. Routine general medical examination at a health care facility - Encouraged heart healthy diet and exercise. Needs to lose weight  - Follow up in one year or sooner if needed - CBC with Differential/Platelet - CMP - Lipid panel - TSH  2. Mixed hyperlipidemia - Consider increase in statin  - CBC with Differential/Platelet - CMP - Lipid panel - TSH  3. History of prostate cancer - Follow up with Urology as directed  4. Gastroesophageal reflux disease without esophagitis - Continue with omprazole   5. Erectile dysfunction after radical prostatectomy  - tadalafil (CIALIS) 5 MG tablet; Take 1 tablet (5 mg total) by mouth daily. as directed  Dispense: 30 tablet; Refill: 6   Dorothyann Peng, NP  This visit occurred during the SARS-CoV-2 public health emergency.  Safety protocols were in place, including screening questions prior to the visit, additional usage of staff PPE, and  extensive cleaning of exam room while observing appropriate contact time as indicated for disinfecting solutions.

## 2019-04-30 ENCOUNTER — Other Ambulatory Visit: Payer: Self-pay | Admitting: Family Medicine

## 2019-04-30 MED ORDER — ATORVASTATIN CALCIUM 20 MG PO TABS: 20.0000 mg | ORAL_TABLET | Freq: Every day | ORAL | 3 refills | Status: DC

## 2019-04-30 NOTE — Addendum Note (Signed)
Addended by: Miles Costain T on: 04/30/2019 04:34 PM   Modules accepted: Orders

## 2019-05-10 ENCOUNTER — Other Ambulatory Visit: Payer: Self-pay | Admitting: Adult Health

## 2019-05-17 ENCOUNTER — Other Ambulatory Visit: Payer: Self-pay | Admitting: Physician Assistant

## 2019-05-17 DIAGNOSIS — C4492 Squamous cell carcinoma of skin, unspecified: Secondary | ICD-10-CM

## 2019-05-17 HISTORY — DX: Squamous cell carcinoma of skin, unspecified: C44.92

## 2019-09-12 ENCOUNTER — Encounter: Payer: Self-pay | Admitting: *Deleted

## 2019-09-17 ENCOUNTER — Telehealth: Payer: Self-pay | Admitting: Physician Assistant

## 2019-09-17 ENCOUNTER — Ambulatory Visit: Payer: 59 | Admitting: Physician Assistant

## 2019-09-17 ENCOUNTER — Encounter: Payer: Self-pay | Admitting: Physician Assistant

## 2019-09-17 ENCOUNTER — Other Ambulatory Visit: Payer: Self-pay

## 2019-09-17 DIAGNOSIS — R21 Rash and other nonspecific skin eruption: Secondary | ICD-10-CM

## 2019-09-17 MED ORDER — ULTRAVATE 0.05 % EX LOTN: 1.0000 "application " | TOPICAL_LOTION | Freq: Two times a day (BID) | CUTANEOUS | 2 refills | Status: AC

## 2019-09-17 MED ORDER — HYDROXYZINE HCL 10 MG PO TABS: 10.0000 mg | ORAL_TABLET | Freq: Three times a day (TID) | ORAL | 2 refills | Status: DC | PRN

## 2019-09-17 MED ORDER — TRIAMCINOLONE ACETONIDE 40 MG/ML IJ SUSP
80.0000 mg | Freq: Once | INTRAMUSCULAR | Status: AC
  Administered 2019-09-17: 80 mg via INTRAMUSCULAR

## 2019-09-17 NOTE — Telephone Encounter (Signed)
Take one to three pills qhs. Start with 1 pill and see how he feels.

## 2019-09-17 NOTE — Patient Instructions (Signed)

## 2019-09-17 NOTE — Progress Notes (Addendum)
   Follow-Up Visit   Subjective  Daniel Booth is a 64 y.o. male who presents for the following: Follow-up (Patient here today for CIS follow up on right forearm, per healed well.) and Rash (Patient also has a rash all over his body x 2 weeks all over the body. Patient states that he has itching that's keeping him up at night, no household changes and patient hasn't tried anything.). Wife has no rash.   The following portions of the chart were reviewed this encounter and updated as appropriate: Tobacco  Allergies  Meds  Problems  Med Hx  Surg Hx  Fam Hx      Objective  Well appearing patient in no apparent distress; mood and affect are within normal limits.  A full examination was performed including scalp, head, eyes, ears, nose, lips, neck, chest, axillae, abdomen, back, buttocks, bilateral upper extremities, bilateral lower extremities, hands, feet, fingers, toes, fingernails, and toenails. All findings within normal limits unless otherwise noted below.  Objective  Right Shoulder - Posterior, left side scalp: Thin scaly erythematous papules coalescing into larger patches  Images      Assessment & Plan  Rash and other nonspecific skin eruption (2) Right Shoulder - Posterior; left side scalp    Halobetasol Propionate (ULTRAVATE) 0.05 % LOTN - Right Shoulder - Posterior, left side scalp  hydrOXYzine (ATARAX/VISTARIL) 10 MG tablet - Right Shoulder - Posterior, left side scalp  Skin / nail biopsy - left side scalp Type of biopsy: tangential   Informed consent: discussed and consent obtained   Timeout: patient name, date of birth, surgical site, and procedure verified   Anesthesia: the lesion was anesthetized in a standard fashion   Anesthetic:  1% lidocaine w/ epinephrine 1-100,000 local infiltration Instrument used: flexible razor blade   Hemostasis achieved with: aluminum chloride and electrodesiccation   Outcome: patient tolerated procedure well   Post-procedure  details: wound care instructions given    Skin / nail biopsy - Right Shoulder - Posterior Type of biopsy: tangential   Informed consent: discussed and consent obtained   Timeout: patient name, date of birth, surgical site, and procedure verified   Anesthesia: the lesion was anesthetized in a standard fashion   Anesthetic:  1% lidocaine w/ epinephrine 1-100,000 local infiltration Instrument used: flexible razor blade   Hemostasis achieved with: aluminum chloride and electrodesiccation   Outcome: patient tolerated procedure well   Post-procedure details: wound care instructions given    Specimen 1 - Surgical pathology Differential Diagnosis: contact dermatitis, psoriasis, atopic dermatitis Check Margins: No Thin scaly erythematous papules coalescing into larger patches  Specimen 2 - Surgical pathology Differential Diagnosis: contact dermatitis, psoriasis, atopic dermatitis  Check Margins: No Thin scaly erythematous papules coalescing into larger patches I, Dailey Buccheri, PA-C, have reviewed all documentation for this visit. The documentation on 01/02/20 for the exam, diagnosis, procedures, and orders are all accurate and complete.

## 2019-09-17 NOTE — Telephone Encounter (Signed)
Patient was seen in office today, 09/17/19. Patient is calling to verify directions for hydroxyzine. Patient states that [facility] said to take one at night, but the directions say take 3 times per day as needed. Leave a detailed message with directions for hydroxyzine if patient does not answer. Chart 13518.

## 2019-09-17 NOTE — Telephone Encounter (Signed)
Informed patient he can take 1 -3 pills of hydroxyzine and start out with 1 pill and see how he feels.

## 2019-09-23 ENCOUNTER — Telehealth: Payer: Self-pay | Admitting: Physician Assistant

## 2019-09-23 NOTE — Telephone Encounter (Signed)
Phone call to patient with pathology results. Continue treatment recheck 2 months, patient has improved.

## 2019-09-24 ENCOUNTER — Telehealth: Payer: Self-pay | Admitting: Physician Assistant

## 2019-09-24 NOTE — Telephone Encounter (Signed)
Gave patient pathology report. He is doing much better on hydroxyzine and triamcinalone cream.  Will follow up in 2 months to assess his progress.

## 2019-10-08 ENCOUNTER — Ambulatory Visit: Payer: 59 | Admitting: Physician Assistant

## 2019-10-09 ENCOUNTER — Other Ambulatory Visit: Payer: Self-pay | Admitting: Physician Assistant

## 2019-10-09 DIAGNOSIS — R21 Rash and other nonspecific skin eruption: Secondary | ICD-10-CM

## 2019-11-29 ENCOUNTER — Ambulatory Visit: Payer: 59 | Admitting: Physician Assistant

## 2019-11-29 ENCOUNTER — Other Ambulatory Visit: Payer: Self-pay

## 2019-11-29 ENCOUNTER — Encounter: Payer: Self-pay | Admitting: Physician Assistant

## 2019-11-29 DIAGNOSIS — Z86006 Personal history of melanoma in-situ: Secondary | ICD-10-CM

## 2019-11-29 DIAGNOSIS — Z86007 Personal history of in-situ neoplasm of skin: Secondary | ICD-10-CM

## 2019-11-29 DIAGNOSIS — Z1283 Encounter for screening for malignant neoplasm of skin: Secondary | ICD-10-CM | POA: Diagnosis not present

## 2019-11-29 DIAGNOSIS — L570 Actinic keratosis: Secondary | ICD-10-CM

## 2019-11-29 DIAGNOSIS — Z85828 Personal history of other malignant neoplasm of skin: Secondary | ICD-10-CM

## 2019-11-29 DIAGNOSIS — D225 Melanocytic nevi of trunk: Secondary | ICD-10-CM

## 2019-11-29 DIAGNOSIS — D485 Neoplasm of uncertain behavior of skin: Secondary | ICD-10-CM

## 2019-11-29 DIAGNOSIS — Z808 Family history of malignant neoplasm of other organs or systems: Secondary | ICD-10-CM

## 2019-11-29 NOTE — Patient Instructions (Signed)

## 2019-11-29 NOTE — Progress Notes (Signed)
   Follow up Visit  Subjective  Daniel Booth is a 64 y.o. male who presents for the following: New Patient (Initial Visit) (family history of melanoma--check skin). History of MIS x 2.   Objective  Well appearing patient in no apparent distress; mood and affect are within normal limits.  A full examination was performed including scalp, head, eyes, ears, nose, lips, neck, chest, axillae, abdomen, back, buttocks, bilateral upper extremities, bilateral lower extremities, hands, feet, fingers, toes, fingernails, and toenails. All findings within normal limits unless otherwise noted below.   Objective  Mid Back: Full body skin exam  Objective  Mid Parietal Scalp: Erythematous patches with gritty scale.  Objective  Mid Back: Macule with irregular color     Objective  Right Forearm - Posterior: Right forearm 05/17/19  Treated with ED&C  Objective  Left Axilla: Left axilla 04/03/18 Treated with ED&C  Objective  Mid Parietal Scalp, Right Thigh - Anterior: Left post scalp-Treated with mohs.  Right thigh- excisied   Assessment & Plan  Screening for malignant neoplasm of skin Mid Back  AK (actinic keratosis) Mid Parietal Scalp  Patient has tolak cream he will start this in the fall.   Neoplasm of uncertain behavior of skin Mid Back  Skin / nail biopsy Type of biopsy: tangential   Informed consent: discussed and consent obtained   Timeout: patient name, date of birth, surgical site, and procedure verified   Procedure prep:  Patient was prepped and draped in usual sterile fashion (Non sterile) Prep type:  Chlorhexidine Anesthesia: the lesion was anesthetized in a standard fashion   Anesthetic:  1% lidocaine w/ epinephrine 1-100,000 local infiltration Instrument used: flexible razor blade   Hemostasis achieved with: ferric subsulfate   Outcome: patient tolerated procedure well   Post-procedure details: wound care instructions given    Specimen 1 - Surgical  pathology Differential Diagnosis: scc vs bcc vs Atypia Check Margins: No  History of squamous cell carcinoma in situ (SCCIS) Right Forearm - Posterior  History of basal cell carcinoma (BCC) of skin Left Axilla  Personal history of melanoma in-situ (2) Right Thigh - Anterior; Mid Parietal Scalp

## 2020-06-02 ENCOUNTER — Ambulatory Visit: Payer: 59 | Admitting: Physician Assistant

## 2020-06-02 ENCOUNTER — Other Ambulatory Visit: Payer: Self-pay

## 2020-06-02 ENCOUNTER — Encounter: Payer: Self-pay | Admitting: Physician Assistant

## 2020-06-02 DIAGNOSIS — L570 Actinic keratosis: Secondary | ICD-10-CM

## 2020-06-02 DIAGNOSIS — Z85820 Personal history of malignant melanoma of skin: Secondary | ICD-10-CM | POA: Diagnosis not present

## 2020-06-02 DIAGNOSIS — L219 Seborrheic dermatitis, unspecified: Secondary | ICD-10-CM | POA: Diagnosis not present

## 2020-06-02 DIAGNOSIS — Z1283 Encounter for screening for malignant neoplasm of skin: Secondary | ICD-10-CM | POA: Diagnosis not present

## 2020-06-02 DIAGNOSIS — Z85828 Personal history of other malignant neoplasm of skin: Secondary | ICD-10-CM | POA: Diagnosis not present

## 2020-06-02 MED ORDER — TOLAK 4 % EX CREA: 1.0000 "application " | TOPICAL_CREAM | Freq: Every evening | CUTANEOUS | 2 refills | Status: DC

## 2020-06-02 MED ORDER — KETOCONAZOLE 2 % EX SHAM: 1.0000 "application " | MEDICATED_SHAMPOO | Freq: Once | CUTANEOUS | 6 refills | Status: AC

## 2020-06-05 ENCOUNTER — Other Ambulatory Visit: Payer: Self-pay | Admitting: Adult Health

## 2020-06-09 NOTE — Telephone Encounter (Signed)
Sent to the pharmacy by e-scribe.  Pt has an upcoming cpx on 07/02/20.

## 2020-06-16 ENCOUNTER — Encounter: Payer: Self-pay | Admitting: Physician Assistant

## 2020-06-16 NOTE — Progress Notes (Signed)
   Follow-Up Visit   Subjective  Daniel Booth is a 65 y.o. male who presents for the following: Follow-up (6 mo check scalp crust still present).   The following portions of the chart were reviewed this encounter and updated as appropriate:      Objective  Well appearing patient in no apparent distress; mood and affect are within normal limits.  A full examination was performed including scalp, head, eyes, ears, nose, lips, neck, chest, axillae, abdomen, back, buttocks, bilateral upper extremities, bilateral lower extremities, hands, feet, fingers, toes, fingernails, and toenails. All findings within normal limits unless otherwise noted below.  Objective  Chest - Medial [facility]): White scar- clear  Objective  Chest - Medial [facility]): Full  body skin examination  Objective  Right Forearm - Anterior: White scar- clear  Objective  Left Upper Arm - Anterior: White scar -clear  Objective  Mid Parietal Scalp (3): Erythematous patches with gritty scale.  Objective  Mid Parietal Scalp: Thin scaly erythematous papules coalescing to plaques.    Assessment & Plan  Personal history of malignant melanoma of skin Chest - Medial (Center)  Yearly skin check  Skin exam for malignant neoplasm Chest - Medial (Center)  Yearly skin check  History of squamous cell carcinoma of skin Right Forearm - Anterior  Yearly skin check  History of basal cell cancer Left Upper Arm - Anterior  Yearly skin check  AK (actinic keratosis) (3) Mid Parietal Scalp  Fluorouracil (TOLAK) 4 % CREA - Mid Parietal Scalp  Destruction of lesion - Mid Parietal Scalp Complexity: simple   Destruction method: cryotherapy   Informed consent: discussed and consent obtained   Timeout:  patient name, date of birth, surgical site, and procedure verified Lesion destroyed using liquid nitrogen: Yes   Cryotherapy cycles:  3 Outcome: patient tolerated procedure well with no complications    Seborrheic  dermatitis Mid Parietal Scalp  ketoconazole (NIZORAL) 2 % shampoo - Mid Parietal Scalp    I, Avigail Pilling, PA-C, have reviewed all documentation's for this visit.  The documentation on 06/16/20 for the exam, diagnosis, procedures and orders are all accurate and complete.

## 2020-07-02 ENCOUNTER — Encounter: Payer: Self-pay | Admitting: Adult Health

## 2020-07-02 ENCOUNTER — Ambulatory Visit (INDEPENDENT_AMBULATORY_CARE_PROVIDER_SITE_OTHER): Payer: 59 | Admitting: Adult Health

## 2020-07-02 ENCOUNTER — Other Ambulatory Visit: Payer: Self-pay

## 2020-07-02 VITALS — BP 120/78 | HR 76 | Temp 97.9°F | Ht 70.0 in | Wt 202.2 lb

## 2020-07-02 DIAGNOSIS — N5231 Erectile dysfunction following radical prostatectomy: Secondary | ICD-10-CM | POA: Diagnosis not present

## 2020-07-02 DIAGNOSIS — K219 Gastro-esophageal reflux disease without esophagitis: Secondary | ICD-10-CM

## 2020-07-02 DIAGNOSIS — Z0000 Encounter for general adult medical examination without abnormal findings: Secondary | ICD-10-CM

## 2020-07-02 DIAGNOSIS — E782 Mixed hyperlipidemia: Secondary | ICD-10-CM | POA: Diagnosis not present

## 2020-07-02 DIAGNOSIS — Z8546 Personal history of malignant neoplasm of prostate: Secondary | ICD-10-CM

## 2020-07-02 LAB — CBC WITH DIFFERENTIAL/PLATELET
Basophils Absolute: 0 10*3/uL (ref 0.0–0.1)
Basophils Relative: 0.2 % (ref 0.0–3.0)
Eosinophils Absolute: 0.1 10*3/uL (ref 0.0–0.7)
Eosinophils Relative: 1.6 % (ref 0.0–5.0)
HCT: 42 % (ref 39.0–52.0)
Hemoglobin: 14.5 g/dL (ref 13.0–17.0)
Lymphocytes Relative: 24.1 % (ref 12.0–46.0)
Lymphs Abs: 1.4 10*3/uL (ref 0.7–4.0)
MCHC: 34.4 g/dL (ref 30.0–36.0)
MCV: 91 fl (ref 78.0–100.0)
Monocytes Absolute: 0.5 10*3/uL (ref 0.1–1.0)
Monocytes Relative: 9 % (ref 3.0–12.0)
Neutro Abs: 3.8 10*3/uL (ref 1.4–7.7)
Neutrophils Relative %: 65.1 % (ref 43.0–77.0)
Platelets: 273 10*3/uL (ref 150.0–400.0)
RBC: 4.62 Mil/uL (ref 4.22–5.81)
RDW: 12.8 % (ref 11.5–15.5)
WBC: 5.8 10*3/uL (ref 4.0–10.5)

## 2020-07-02 LAB — COMPREHENSIVE METABOLIC PANEL
ALT: 27 U/L (ref 0–53)
AST: 26 U/L (ref 0–37)
Albumin: 4.5 g/dL (ref 3.5–5.2)
Alkaline Phosphatase: 85 U/L (ref 39–117)
BUN: 20 mg/dL (ref 6–23)
CO2: 29 mEq/L (ref 19–32)
Calcium: 9.6 mg/dL (ref 8.4–10.5)
Chloride: 104 mEq/L (ref 96–112)
Creatinine, Ser: 0.94 mg/dL (ref 0.40–1.50)
GFR: 85.7 mL/min (ref >60.00)
Glucose, Bld: 96 mg/dL (ref 70–99)
Potassium: 4.3 mEq/L (ref 3.5–5.1)
Sodium: 140 mEq/L (ref 135–145)
Total Bilirubin: 0.7 mg/dL (ref 0.2–1.2)
Total Protein: 6.6 g/dL (ref 6.0–8.3)

## 2020-07-02 LAB — LIPID PANEL
Cholesterol: 189 mg/dL (ref 0–200)
HDL: 68 mg/dL (ref >39.00)
LDL Cholesterol: 98 mg/dL (ref 0–99)
NonHDL: 121.25
Total CHOL/HDL Ratio: 3
Triglycerides: 115 mg/dL (ref 0.0–149.0)
VLDL: 23 mg/dL (ref 0.0–40.0)

## 2020-07-02 LAB — HEMOGLOBIN A1C: Hgb A1c MFr Bld: 5.5 % (ref 4.6–6.5)

## 2020-07-02 LAB — TSH: TSH: 2.45 u[IU]/mL (ref 0.35–4.50)

## 2020-07-02 LAB — PSA: PSA: 0 ng/mL — ABNORMAL LOW (ref 0.10–4.00)

## 2020-07-02 NOTE — Progress Notes (Signed)
Subjective:    Patient ID: Daniel Booth, male    DOB: [birth year], 65 y.o.   MRN: [id number]  HPI Patient presents for yearly preventative medicine examination. He is a pleasant 65 year old male who  has a past medical history of Allergy, Arthritis, Basal cell carcinoma (15/72/6203), Complication of anesthesia (2011), Diverticulitis, Diverticulosis, GERD (gastroesophageal reflux disease), History of Barrett's esophagus, History of kidney stones, Hyperlipidemia, Melanoma ([place]) (10/25/2011), Melanoma ([place]) (07/05/2011), Prostate cancer ([place]), and Squamous cell carcinoma of skin (05/17/2019).  Hyperlipidemia-takes Lipitor 20 mg daily.  He denies myalgia or fatigue Lab Results  Component Value Date   CHOL 218 (H) 04/25/2019   HDL 67.20 04/25/2019   LDLCALC 133 (H) 04/25/2019   LDLDIRECT 148.0 06/07/2006   TRIG 90.0 04/25/2019   CHOLHDL 3 04/25/2019    GERD-is controlled with omeprazole  Erectile dysfunction-takes Cialis as needed  History of prostate cancer-had prostatectomy in 2010.  Is seen by urology on a yearly basis  History of skin cancer-basal cell and squamous cell carcinoma as well as malignant melanoma.  Is seen by dermatology on a yearly basis  All immunizations and health maintenance protocols were reviewed with the patient and needed orders were placed.  Appropriate screening laboratory values were ordered for the patient including screening of hyperlipidemia, renal function and hepatic function. If indicated by BPH, a PSA was ordered.  Medication reconciliation,  past medical history, social history, problem list and allergies were reviewed in detail with the patient  Goals were established with regard to weight loss, exercise, and  diet in compliance with medications. He is golfing, playing tennis, and walking multiple times a week.   Wt Readings from Last 3 Encounters:  07/02/20 202 lb 3.2 oz (91.7 kg)  04/25/19 201 lb (91.2 kg)  10/09/18 190 lb (86.2 kg)   Due  for colonoscopy next year.   He has no acute complaints today   Review of Systems  Constitutional: Negative.   HENT: Negative.   Eyes: Negative.   Respiratory: Negative.   Cardiovascular: Negative.   Gastrointestinal: Negative.   Endocrine: Negative.   Genitourinary: Negative.   Musculoskeletal: Negative.   Skin: Negative.   Allergic/Immunologic: Negative.   Neurological: Negative.   Hematological: Negative.   Psychiatric/Behavioral: Negative.   All other systems reviewed and are negative.  Past Medical History:  Diagnosis Date  . Allergy   . Arthritis   . Basal cell carcinoma 04/03/2018   nod-left axilla- (CX35FU)  . Complication of anesthesia 2011   "vitals dropped with endoscopy"  . Diverticulitis   . Diverticulosis   . GERD (gastroesophageal reflux disease)    on omeprazole  . History of Barrett's esophagus   . History of kidney stones    10-15 yrs ago  . Hyperlipidemia   . Melanoma ([place]) 10/25/2011   in situ-Left post scalp (MOHS)  . Melanoma ([place]) 07/05/2011   in situ-Right ant. thigh- (EXC)  . Prostate cancer ([place])   . Squamous cell carcinoma of skin 05/17/2019   in situ-right forarm- (CX35FU)    Social History   Socioeconomic History  . Marital status: Married    Spouse name: Not on file  . Number of children: Not on file  . Years of education: Not on file  . Highest education level: Not on file  Occupational History  . Not on file  Tobacco Use  . Smoking status: Former Smoker    Packs/day: 0.25    Types: Cigarettes    Quit date: 04/27/1982  Years since quitting: 38.2  . Smokeless tobacco: Never Used  Vaping Use  . Vaping Use: Never used  Substance and Sexual Activity  . Alcohol use: Yes    Alcohol/week: 4.0 standard drinks    Types: 4 Glasses of wine per week  . Drug use: No  . Sexual activity: Not on file  Other Topics Concern  . 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 COLONOSCOPY    . LAMINECTOMY AND MICRODISCECTOMY LUMBAR SPINE  2019  . MOHS SURGERY Right    scalp  . PROSTATE SURGERY  2010  . SHOULDER ARTHROSCOPY  2007   right  . TOTAL HIP ARTHROPLASTY Left 05/10/2016   Procedure: TOTAL HIP ARTHROPLASTY;  Surgeon: Garald Balding, MD;  Location: [facility];  Service: Orthopedics;  Laterality: Left;  . UPPER GASTROINTESTINAL ENDOSCOPY      Family History  Problem Relation Age of Onset  . Colon polyps Father   . Diabetes Father   . Melanoma Mother   . Melanoma Maternal Grandmother   . Colon cancer Neg Hx   . Esophageal cancer Neg Hx   . Rectal cancer Neg Hx   . Stomach cancer Neg Hx     Allergies  Allergen Reactions  . No Known Allergies     Current Outpatient Medications on File Prior to Visit  Medication Sig Dispense Refill  . aspirin 81 MG tablet Take 81 mg by mouth daily.    Marland Kitchen atorvastatin (LIPITOR) 20 MG tablet TAKE 1 TABLET BY MOUTH EVERY DAY 90 tablet 0  . Calcium-Magnesium (CAL-MAG PO) Take 1 tablet by mouth daily.    . Coenzyme Q10 (CO Q10) 60 MG CAPS Take 1 tablet by mouth daily.    Stasia Cavalier (EUCRISA) 2 % OINT Eucrisa 2 % topical ointment    . dexamethasone (DECADRON) 4 MG/ML injection dexamethasone sodium phosphate 4 mg/mL injection solution  Inject 2.5 mL by intramuscular route.    . Fluorouracil (TOLAK) 4 % CREA Apply 1 application topically at bedtime. Apply qhs Monday - Sunday x 2 weeks 40 g 2  . fluticasone (FLONASE) 50 MCG/ACT nasal spray USE AS DIRECTED 32 g 6  . Halobetasol Propionate (ULTRAVATE) 0.05 % LOTN Apply 1 application topically 2 (two) times daily. 60 mL 2  . ibuprofen  (ADVIL) 200 MG tablet Take 200 mg by mouth daily.    . Loratadine 10 MG CAPS Claritin    . Multiple Vitamin (MULTIVITAMIN) tablet Take 1 tablet by mouth daily.    . Omega-3 1000 MG CAPS Take 1 capsule by mouth daily.    Marland Kitchen omeprazole (PRILOSEC) 20 MG capsule Take 1 capsule (20 mg total) by mouth daily. 90 capsule 3  . tadalafil (CIALIS) 5 MG tablet Take 1 tablet (5 mg total) by mouth daily. as directed 30 tablet 6  . triamcinolone cream (KENALOG) 0.1 % APPLY TO AFFECTED AREA 1 2 TIMES A DAY     No current facility-administered medications on file prior to visit.    BP  120/78 (BP Location: Left Arm, Patient Position: Sitting, Cuff Size: Large)   Pulse 76   Temp 97.9 F (36.6 C) (Oral)   Ht 5\' 10"  (1.778 m)   Wt 202 lb 3.2 oz (91.7 kg)   SpO2 95%   BMI 29.01 kg/m       Objective:   Physical Exam Vitals and nursing note reviewed.  Constitutional:      General: He is not in acute distress.    Appearance: Normal appearance. He is well-developed and normal weight.  HENT:     Head: Normocephalic and atraumatic.     Right Ear: Tympanic membrane, ear canal and external ear normal. There is no impacted cerumen.     Left Ear: Tympanic membrane, ear canal and external ear normal. There is no impacted cerumen.     Nose: Nose normal. No congestion or rhinorrhea.     Mouth/Throat:     Mouth: Mucous membranes are moist.     Pharynx: Oropharynx is clear. No oropharyngeal exudate or posterior oropharyngeal erythema.  Eyes:     General:        Right eye: No discharge.        Left eye: No discharge.     Extraocular Movements: Extraocular movements intact.     Conjunctiva/sclera: Conjunctivae normal.     Pupils: Pupils are equal, round, and reactive to light.  Neck:     Vascular: No carotid bruit.     Trachea: No tracheal deviation.  Cardiovascular:     Rate and Rhythm: Normal rate and regular rhythm.     Pulses: Normal pulses.     Heart sounds: Normal heart sounds. No murmur heard. No  friction rub. No gallop.   Pulmonary:     Effort: Pulmonary effort is normal. No respiratory distress.     Breath sounds: Normal breath sounds. No stridor. No wheezing, rhonchi or rales.  Chest:     Chest wall: No tenderness.  Abdominal:     General: Bowel sounds are normal. There is no distension.     Palpations: Abdomen is soft. There is no mass.     Tenderness: There is no abdominal tenderness. There is no right CVA tenderness, left CVA tenderness, guarding or rebound.     Hernia: No hernia is present.  Musculoskeletal:        General: No swelling, tenderness, deformity or signs of injury. Normal range of motion.     Right lower leg: No edema.     Left lower leg: No edema.  Lymphadenopathy:     Cervical: No cervical adenopathy.  Skin:    General: Skin is warm and dry.     Capillary Refill: Capillary refill takes less than 2 seconds.     Coloration: Skin is not jaundiced or pale.     Findings: No bruising, erythema, lesion or rash.  Neurological:     General: No focal deficit present.     Mental Status: He is alert and oriented to person, place, and time.     Cranial Nerves: No cranial nerve deficit.     Sensory: No sensory deficit.     Motor: No weakness.     Coordination: Coordination normal.     Gait: Gait normal.     Deep Tendon Reflexes: Reflexes normal.  Psychiatric:        Mood and Affect: Mood normal.        Behavior: Behavior normal.        Thought Content: Thought content normal.  Judgment: Judgment normal.       Assessment & Plan:  1. Routine general medical examination at a health care facility - Follow up in one year or sooner if needed - Continue to work on weight loss through diet and exercise  - CBC with Differential/Platelet; Future - Comprehensive metabolic panel; Future - Lipid panel; Future - TSH; Future - Hemoglobin A1c; Future - Hemoglobin A1c - TSH - Lipid panel - Comprehensive metabolic panel - CBC with Differential/Platelet  2.  History of prostate cancer  - PSA; Future - PSA  3. Gastroesophageal reflux disease without esophagitis - Continue with Prilosec  - CBC with Differential/Platelet; Future - Comprehensive metabolic panel; Future - Lipid panel; Future - TSH; Future - Hemoglobin A1c; Future - Hemoglobin A1c - TSH - Lipid panel - Comprehensive metabolic panel - CBC with Differential/Platelet  4. Erectile dysfunction after radical prostatectomy - Continue with Cialis PRN   5. Mixed hyperlipidemia - Consider increase in statin  - CBC with Differential/Platelet; Future - Comprehensive metabolic panel; Future - Lipid panel; Future - TSH; Future - Hemoglobin A1c; Future - Hemoglobin A1c - TSH - Lipid panel - Comprehensive metabolic panel - CBC with Differential/Platelet   Dorothyann Peng, NP

## 2020-07-03 ENCOUNTER — Telehealth: Payer: Self-pay | Admitting: Adult Health

## 2020-07-03 NOTE — Telephone Encounter (Signed)
Called patient back and informed him of provider's comments on lab result ( His labs are all stable. Cholesterol panel has improved significantly and is now normal. I am going to keep him on his current dose of Lipitor ) He verbalized understanding

## 2020-07-03 NOTE — Telephone Encounter (Signed)
Patient is calling back for lab results, please advise. CB is [phone]

## 2020-07-16 ENCOUNTER — Encounter: Payer: Self-pay | Admitting: Internal Medicine

## 2020-07-16 ENCOUNTER — Ambulatory Visit: Payer: 59 | Admitting: Internal Medicine

## 2020-07-16 ENCOUNTER — Other Ambulatory Visit: Payer: Self-pay

## 2020-07-16 ENCOUNTER — Telehealth: Payer: Self-pay | Admitting: Adult Health

## 2020-07-16 VITALS — BP 110/70 | HR 64 | Temp 98.0°F | Wt 201.8 lb

## 2020-07-16 DIAGNOSIS — K5792 Diverticulitis of intestine, part unspecified, without perforation or abscess without bleeding: Secondary | ICD-10-CM | POA: Diagnosis not present

## 2020-07-16 LAB — COMPREHENSIVE METABOLIC PANEL
ALT: 29 U/L (ref 0–53)
AST: 22 U/L (ref 0–37)
Albumin: 4.5 g/dL (ref 3.5–5.2)
Alkaline Phosphatase: 93 U/L (ref 39–117)
BUN: 16 mg/dL (ref 6–23)
CO2: 29 mEq/L (ref 19–32)
Calcium: 9.6 mg/dL (ref 8.4–10.5)
Chloride: 102 mEq/L (ref 96–112)
Creatinine, Ser: 0.87 mg/dL (ref 0.40–1.50)
GFR: 91.2 mL/min (ref >60.00)
Glucose, Bld: 98 mg/dL (ref 70–99)
Potassium: 4.4 mEq/L (ref 3.5–5.1)
Sodium: 139 mEq/L (ref 135–145)
Total Bilirubin: 0.7 mg/dL (ref 0.2–1.2)
Total Protein: 6.8 g/dL (ref 6.0–8.3)

## 2020-07-16 LAB — CBC WITH DIFFERENTIAL/PLATELET
Basophils Absolute: 0 10*3/uL (ref 0.0–0.1)
Basophils Relative: 0.1 % (ref 0.0–3.0)
Eosinophils Absolute: 0.1 10*3/uL (ref 0.0–0.7)
Eosinophils Relative: 1.1 % (ref 0.0–5.0)
HCT: 44.2 % (ref 39.0–52.0)
Hemoglobin: 14.8 g/dL (ref 13.0–17.0)
Lymphocytes Relative: 19.1 % (ref 12.0–46.0)
Lymphs Abs: 1.6 10*3/uL (ref 0.7–4.0)
MCHC: 33.5 g/dL (ref 30.0–36.0)
MCV: 92.3 fl (ref 78.0–100.0)
Monocytes Absolute: 0.7 10*3/uL (ref 0.1–1.0)
Monocytes Relative: 8 % (ref 3.0–12.0)
Neutro Abs: 5.9 10*3/uL (ref 1.4–7.7)
Neutrophils Relative %: 71.7 % (ref 43.0–77.0)
Platelets: 268 10*3/uL (ref 150.0–400.0)
RBC: 4.79 Mil/uL (ref 4.22–5.81)
RDW: 12.6 % (ref 11.5–15.5)
WBC: 8.3 10*3/uL (ref 4.0–10.5)

## 2020-07-16 MED ORDER — METRONIDAZOLE 500 MG PO TABS: 500.0000 mg | ORAL_TABLET | Freq: Three times a day (TID) | ORAL | 0 refills | Status: AC

## 2020-07-16 MED ORDER — CIPROFLOXACIN HCL 500 MG PO TABS: 500.0000 mg | ORAL_TABLET | Freq: Two times a day (BID) | ORAL | 0 refills | Status: AC

## 2020-07-16 NOTE — Telephone Encounter (Signed)
Patient is calling and stated that he believes he has diverticulitis because he is having severe pain in his lower left abdomen and wanted to see if the provider could fit him in the schedule today to be seen. Offered an appointment with another provider but patient only wants to see [facility]. Patient is scheduled for tomorrow morning and advised pt if symptoms get any worse to go to urgent care or the ED, please advise. CB is [phone]

## 2020-07-16 NOTE — Progress Notes (Signed)
Established Patient Office Visit     This visit occurred during the SARS-CoV-2 public health emergency.  Safety protocols were in place, including screening questions prior to the visit, additional usage of staff PPE, and extensive cleaning of exam room while observing appropriate contact time as indicated for disinfecting solutions.    CC/Reason for Visit: Left lower quadrant pain  HPI: Daniel Booth is a 65 y.o. male who is coming in today for the above mentioned reasons. Past Medical History is significant for: Diverticulitis.  He has had 4 prior episodes.  Once he was hospitalized for 3 days.  Pain is of the left lower quadrant.  He has some nausea no vomiting, he feels a little dizzy.  He has not noticed a change in his bowel movements.  No fever.   Past Medical/Surgical History: Past Medical History:  Diagnosis Date  . Allergy   . Arthritis   . Basal cell carcinoma 04/03/2018   nod-left axilla- (CX35FU)  . Complication of anesthesia 2011   "vitals dropped with endoscopy"  . Diverticulitis   . Diverticulosis   . GERD (gastroesophageal reflux disease)    on omeprazole  . History of Barrett's esophagus   . History of kidney stones    10-15 yrs ago  . Hyperlipidemia   . Melanoma ([place]) 10/25/2011   in situ-Left post scalp (MOHS)  . Melanoma ([place]) 07/05/2011   in situ-Right ant. thigh- (EXC)  . Prostate cancer ([place])   . Squamous cell carcinoma of skin 05/17/2019   in situ-right forarm- (CX35FU)    Past Surgical History:  Procedure Laterality Date  . COLONOSCOPY    . LAMINECTOMY AND MICRODISCECTOMY LUMBAR SPINE  2019  . MOHS SURGERY Right    scalp  . PROSTATE SURGERY  2010  . SHOULDER ARTHROSCOPY  2007   right  . TOTAL HIP ARTHROPLASTY Left 05/10/2016   Procedure: TOTAL HIP ARTHROPLASTY;  Surgeon: Garald Balding, MD;  Location: [facility];  Service: Orthopedics;  Laterality: Left;  . UPPER GASTROINTESTINAL ENDOSCOPY      Social History:  reports that he quit  smoking about 38 years ago. His smoking use included cigarettes. He smoked 0.25 packs per day. He has never used smokeless tobacco. He reports current alcohol use of about 4.0 standard drinks of alcohol per week. He reports that he does not use drugs.  Allergies: Allergies  Allergen Reactions  . No Known Allergies     Family History:  Family History  Problem Relation Age of Onset  . Colon polyps Father   . Diabetes Father   . Melanoma Mother   . Melanoma Maternal Grandmother   . Colon cancer Neg Hx   . Esophageal cancer Neg Hx   . Rectal cancer Neg Hx   . Stomach cancer Neg Hx      Current Outpatient Medications:  .  aspirin 81 MG tablet, Take 81 mg by mouth daily., Disp: , Rfl:  .  atorvastatin (LIPITOR) 20 MG tablet, TAKE 1 TABLET BY MOUTH EVERY DAY, Disp: 90 tablet, Rfl: 0 .  Calcium-Magnesium (CAL-MAG PO), Take 1 tablet by mouth daily., Disp: , Rfl:  .  ciprofloxacin (CIPRO) 500 MG tablet, Take 1 tablet (500 mg total) by mouth 2 (two) times daily for 14 days., Disp: 28 tablet, Rfl: 0 .  Coenzyme Q10 (CO Q10) 60 MG CAPS, Take 1 tablet by mouth daily., Disp: , Rfl:  .  Crisaborole (EUCRISA) 2 % OINT, Eucrisa 2 % topical ointment, Disp: ,  Rfl:  .  Fluorouracil (TOLAK) 4 % CREA, Apply 1 application topically at bedtime. Apply qhs Monday - Sunday x 2 weeks, Disp: 40 g, Rfl: 2 .  fluticasone (FLONASE) 50 MCG/ACT nasal spray, USE AS DIRECTED, Disp: 32 g, Rfl: 6 .  Halobetasol Propionate (ULTRAVATE) 0.05 % LOTN, Apply 1 application topically 2 (two) times daily., Disp: 60 mL, Rfl: 2 .  ibuprofen (ADVIL) 200 MG tablet, Take 200 mg by mouth daily., Disp: , Rfl:  .  metroNIDAZOLE (FLAGYL) 500 MG tablet, Take 1 tablet (500 mg total) by mouth 3 (three) times daily for 14 days., Disp: 42 tablet, Rfl: 0 .  Multiple Vitamin (MULTIVITAMIN) tablet, Take 1 tablet by mouth daily., Disp: , Rfl:  .  Omega-3 1000 MG CAPS, Take 1 capsule by mouth daily., Disp: , Rfl:  .  omeprazole (PRILOSEC) 20 MG  capsule, Take 1 capsule (20 mg total) by mouth daily., Disp: 90 capsule, Rfl: 3 .  tadalafil (CIALIS) 5 MG tablet, Take 1 tablet (5 mg total) by mouth daily. as directed, Disp: 30 tablet, Rfl: 6 .  triamcinolone cream (KENALOG) 0.1 %, APPLY TO AFFECTED AREA 1 2 TIMES A DAY, Disp: , Rfl:   Review of Systems:  Constitutional: Denies fever, chills, diaphoresis, appetite change and fatigue.  HEENT: Denies photophobia, eye pain, redness, hearing loss, ear pain, congestion, sore throat, rhinorrhea, sneezing, mouth sores, trouble swallowing, neck pain, neck stiffness and tinnitus.   Respiratory: Denies SOB, DOE, cough, chest tightness,  and wheezing.   Cardiovascular: Denies chest pain, palpitations and leg swelling.  Gastrointestinal: Denies  vomiting,  diarrhea, constipation, blood in stool and abdominal distention.  Genitourinary: Denies dysuria, urgency, frequency, hematuria, flank pain and difficulty urinating.  Endocrine: Denies: hot or cold intolerance, sweats, changes in hair or nails, polyuria, polydipsia. Musculoskeletal: Denies myalgias, back pain, joint swelling, arthralgias and gait problem.  Skin: Denies pallor, rash and wound.  Neurological: Denies  seizures, syncope, weakness,  numbness and headaches.  Hematological: Denies adenopathy. Easy bruising, personal or family bleeding history  Psychiatric/Behavioral: Denies suicidal ideation, mood changes, confusion, nervousness, sleep disturbance and agitation    Physical Exam: Vitals:   07/16/20 1326  BP: 110/70  Pulse: 64  Temp: 98 F (36.7 C)  TempSrc: Oral  SpO2: 97%  Weight: 201 lb 12.8 oz (91.5 kg)    Body mass index is 28.96 kg/m.   Constitutional: NAD, calm, comfortable Eyes: PERRL, lids and conjunctivae normal ENMT: Mucous membranes are moist.  Abdomen: Significant pain to palpation of the left lower quadrant, no rebound, no guarding, positive bowel sounds. Neurologic: Grossly intact and nonfocal Psychiatric:  Normal judgment and insight. Alert and oriented x 3. Normal mood.    Impression and Plan:  Diverticulitis -Have advised clear liquid diet followed by bland diet for the next few days. -I will prescribe Cipro and Flagyl for home to take for 2 weeks. -He knows to go to the emergency room if pain worsens or persists despite antibiotics or he develops fever or significant emesis. -Once this flareup has cooled down, I would recommend a referral to surgery as this is now his fourth or fifth episode of diverticulitis.     Lelon Frohlich, MD [facility] at [facility]

## 2020-07-16 NOTE — Telephone Encounter (Signed)
Left a detailed message with the information below and asked if the pt could arrive today at 11:15am for an appt with Dr Jerilee Hoh.  Left a message for the pt to return a call if he would be able to come in at this time.

## 2020-07-16 NOTE — Telephone Encounter (Signed)
I called the pt back and he stated he cannot come in today at 11:15am.  Appt entered to arrive at 1:15pm.

## 2020-07-16 NOTE — Telephone Encounter (Signed)
I am sorry, I have no where to fit him in today, I am completely booked. Would recommend patient seen another provider today if there are any openings so we can get him some relief

## 2020-07-17 ENCOUNTER — Ambulatory Visit: Payer: 59 | Admitting: Adult Health

## 2020-08-26 ENCOUNTER — Ambulatory Visit: Payer: 59 | Admitting: Dermatology

## 2020-08-26 ENCOUNTER — Encounter: Payer: Self-pay | Admitting: Dermatology

## 2020-08-26 ENCOUNTER — Other Ambulatory Visit: Payer: Self-pay

## 2020-08-26 DIAGNOSIS — Z85828 Personal history of other malignant neoplasm of skin: Secondary | ICD-10-CM | POA: Diagnosis not present

## 2020-08-26 DIAGNOSIS — Z1283 Encounter for screening for malignant neoplasm of skin: Secondary | ICD-10-CM | POA: Diagnosis not present

## 2020-08-26 DIAGNOSIS — L570 Actinic keratosis: Secondary | ICD-10-CM | POA: Diagnosis not present

## 2020-09-05 ENCOUNTER — Encounter: Payer: Self-pay | Admitting: Dermatology

## 2020-09-05 NOTE — Progress Notes (Signed)
   Follow-Up Visit   Subjective  Daniel Booth is a 64 y.o. male who presents for the following: Skin Problem (New lesion on left chest x 1 month- seems to be calming down now).  New spot on chest Location:  Duration:  Quality:  Associated Signs/Symptoms: Modifying Factors:  Severity:  Timing: Context:   Objective  Well appearing patient in no apparent distress; mood and affect are within normal limits. Objective  Left Breast: 5 mm pink gritty crust  Objective  Mid Back: Waist up skin exam.  No atypical pigmented lesions, new or recurrent nonmelanoma skin cancer.  Objective  Left Axilla: No sign recurrence    All skin waist up examined.   Assessment & Plan    Lichenoid actinic keratosis Left Breast  Destruction of lesion - Left Breast Complexity: simple   Destruction method: cryotherapy   Informed consent: discussed and consent obtained   Timeout:  patient name, date of birth, surgical site, and procedure verified Lesion destroyed using liquid nitrogen: Yes   Cryotherapy cycles:  3 (3) Outcome: patient tolerated procedure well with no complications   Post-procedure details: wound care instructions given    Screening for malignant neoplasm of skin Mid Back  Yearly skin exams.   Personal history of skin cancer Left Axilla  Recheck as needed change      I, Lavonna Monarch, MD, have reviewed all documentation for this visit.  The documentation on 09/05/20 for the exam, diagnosis, procedures, and orders are all accurate and complete.

## 2020-09-07 NOTE — Addendum Note (Signed)
Addended by: Lavonna Monarch on: 09/07/2020 06:27 AM   Modules accepted: Level of Service

## 2020-09-11 ENCOUNTER — Ambulatory Visit: Payer: 59 | Admitting: Adult Health

## 2020-09-11 ENCOUNTER — Other Ambulatory Visit: Payer: Self-pay

## 2020-09-11 VITALS — BP 122/84 | HR 83 | Temp 98.0°F | Ht 70.0 in | Wt 196.0 lb

## 2020-09-11 DIAGNOSIS — K5792 Diverticulitis of intestine, part unspecified, without perforation or abscess without bleeding: Secondary | ICD-10-CM | POA: Diagnosis not present

## 2020-09-11 LAB — CBC WITH DIFFERENTIAL/PLATELET
Basophils Absolute: 0 10*3/uL (ref 0.0–0.1)
Basophils Relative: 0.1 % (ref 0.0–3.0)
Eosinophils Absolute: 0.1 10*3/uL (ref 0.0–0.7)
Eosinophils Relative: 1.7 % (ref 0.0–5.0)
HCT: 45.2 % (ref 39.0–52.0)
Hemoglobin: 15.1 g/dL (ref 13.0–17.0)
Lymphocytes Relative: 19.5 % (ref 12.0–46.0)
Lymphs Abs: 1.3 10*3/uL (ref 0.7–4.0)
MCHC: 33.3 g/dL (ref 30.0–36.0)
MCV: 92.4 fl (ref 78.0–100.0)
Monocytes Absolute: 0.5 10*3/uL (ref 0.1–1.0)
Monocytes Relative: 7.5 % (ref 3.0–12.0)
Neutro Abs: 4.8 10*3/uL (ref 1.4–7.7)
Neutrophils Relative %: 71.2 % (ref 43.0–77.0)
Platelets: 286 10*3/uL (ref 150.0–400.0)
RBC: 4.89 Mil/uL (ref 4.22–5.81)
RDW: 13 % (ref 11.5–15.5)
WBC: 6.7 10*3/uL (ref 4.0–10.5)

## 2020-09-11 LAB — COMPREHENSIVE METABOLIC PANEL
ALT: 29 U/L (ref 0–53)
AST: 23 U/L (ref 0–37)
Albumin: 4.8 g/dL (ref 3.5–5.2)
Alkaline Phosphatase: 99 U/L (ref 39–117)
BUN: 17 mg/dL (ref 6–23)
CO2: 30 mEq/L (ref 19–32)
Calcium: 10.1 mg/dL (ref 8.4–10.5)
Chloride: 101 mEq/L (ref 96–112)
Creatinine, Ser: 0.96 mg/dL (ref 0.40–1.50)
GFR: 83.45 mL/min (ref >60.00)
Glucose, Bld: 104 mg/dL — ABNORMAL HIGH (ref 70–99)
Potassium: 4.7 mEq/L (ref 3.5–5.1)
Sodium: 140 mEq/L (ref 135–145)
Total Bilirubin: 0.6 mg/dL (ref 0.2–1.2)
Total Protein: 7.2 g/dL (ref 6.0–8.3)

## 2020-09-11 MED ORDER — AMOXICILLIN-POT CLAVULANATE 875-125 MG PO TABS: 1.0000 | ORAL_TABLET | Freq: Three times a day (TID) | ORAL | 0 refills | Status: AC

## 2020-09-11 NOTE — Progress Notes (Signed)
Subjective:    Patient ID: Daniel Booth, male    DOB: [birth year], 65 y.o.   MRN: [id number]  HPI  65 year old male who  has a past medical history of Allergy, Arthritis, Basal cell carcinoma (78/46/9629), Complication of anesthesia (2011), Diverticulitis, Diverticulosis, GERD (gastroesophageal reflux disease), History of Barrett's esophagus, History of kidney stones, Hyperlipidemia, Melanoma ([place]) (10/25/2011), Melanoma ([place]) (07/05/2011), Prostate cancer ([place]), and Squamous cell carcinoma of skin (05/17/2019).  He presents to the office today for an acute issue.  His symptoms have been present for roughly 3 days.  Symptoms include watery diarrhea up to 8 times a day, left lower abdominal pain, generalized fatigue, and today he noticed a single episode of bright red blood in the toilet bowl.  Does have a history of diverticulitis, last treated in March 2022.  Symptoms seem to be similar in nature.  Denies fevers, chills, or vomiting.  Had 1 episode of nausea last night.  Review of Systems See HPI   Past Medical History:  Diagnosis Date  . Allergy   . Arthritis   . Basal cell carcinoma 04/03/2018   nod-left axilla- (CX35FU)  . Complication of anesthesia 2011   "vitals dropped with endoscopy"  . Diverticulitis   . Diverticulosis   . GERD (gastroesophageal reflux disease)    on omeprazole  . History of Barrett's esophagus   . History of kidney stones    10-15 yrs ago  . Hyperlipidemia   . Melanoma ([place]) 10/25/2011   in situ-Left post scalp (MOHS)  . Melanoma ([place]) 07/05/2011   in situ-Right ant. thigh- (EXC)  . Prostate cancer ([place])   . Squamous cell carcinoma of skin 05/17/2019   in situ-right forarm- (CX35FU)    Social History   Socioeconomic History  . Marital status: Married    Spouse name: Not on file  . Number of children: Not on file  . Years of education: Not on file  . Highest education level: Not on file  Occupational History  . Not on file  Tobacco Use  .  Smoking status: Former Smoker    Packs/day: 0.25    Types: Cigarettes    Quit date: 04/27/1982    Years since quitting: 38.4  . Smokeless tobacco: Never Used  Vaping Use  . Vaping Use: Never used  Substance and Sexual Activity  . Alcohol use: Yes    Alcohol/week: 4.0 standard drinks    Types: 4 Glasses of wine per week  . Drug use: No  . Sexual activity: Not on file  Other Topics Concern  . 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 COLONOSCOPY    . LAMINECTOMY AND MICRODISCECTOMY LUMBAR SPINE  2019  . MOHS SURGERY Right    scalp  . PROSTATE SURGERY  2010  . SHOULDER ARTHROSCOPY  2007   right  . TOTAL HIP ARTHROPLASTY Left 05/10/2016   Procedure: TOTAL HIP ARTHROPLASTY;  Surgeon: Garald Balding, MD;  Location: [facility];  Service: Orthopedics;  Laterality: Left;  .  UPPER GASTROINTESTINAL ENDOSCOPY      Family History  Problem Relation Age of Onset  . Colon polyps Father   . Diabetes Father   . Melanoma Mother   . Melanoma Maternal Grandmother   . Colon cancer Neg Hx   . Esophageal cancer Neg Hx   . Rectal cancer Neg Hx   . Stomach cancer Neg Hx     Allergies  Allergen Reactions  . No Known Allergies     Current Outpatient Medications on File Prior to Visit  Medication Sig Dispense Refill  . aspirin 81 MG tablet Take 81 mg by mouth daily.    Marland Kitchen atorvastatin (LIPITOR) 20 MG tablet TAKE 1 TABLET BY MOUTH EVERY DAY 90 tablet 0  . Calcium-Magnesium (CAL-MAG PO) Take 1 tablet by mouth daily.    . Coenzyme Q10 (CO Q10) 60 MG CAPS Take 1 tablet by mouth daily.    Stasia Cavalier  (EUCRISA) 2 % OINT Eucrisa 2 % topical ointment    . Fluorouracil (TOLAK) 4 % CREA Apply 1 application topically at bedtime. Apply qhs Monday - Sunday x 2 weeks 40 g 2  . fluticasone (FLONASE) 50 MCG/ACT nasal spray USE AS DIRECTED 32 g 6  . Halobetasol Propionate (ULTRAVATE) 0.05 % LOTN Apply 1 application topically 2 (two) times daily. 60 mL 2  . ibuprofen (ADVIL) 200 MG tablet Take 200 mg by mouth daily.    . Multiple Vitamin (MULTIVITAMIN) tablet Take 1 tablet by mouth daily.    . Omega-3 1000 MG CAPS Take 1 capsule by mouth daily.    Marland Kitchen omeprazole (PRILOSEC) 20 MG capsule Take 1 capsule (20 mg total) by mouth daily. 90 capsule 3  . tadalafil (CIALIS) 5 MG tablet Take 1 tablet (5 mg total) by mouth daily. as directed 30 tablet 6  . triamcinolone cream (KENALOG) 0.1 % APPLY TO AFFECTED AREA 1 2 TIMES A DAY     No current facility-administered medications on file prior to visit.    BP 122/84 (BP Location: Left Arm, Patient Position: Sitting, Cuff Size: Normal)   Pulse 83   Temp 98 F (36.7 C) (Oral)   Ht 5\' 10"  (1.778 m)   Wt 196 lb (88.9 kg)   SpO2 95%   BMI 28.12 kg/m       Objective:   Physical Exam Vitals and nursing note reviewed.  Constitutional:      Appearance: He is well-developed.  Cardiovascular:     Rate and Rhythm: Normal rate and regular rhythm.     Heart sounds: Normal heart sounds.  Pulmonary:     Effort: Pulmonary effort is normal.     Breath sounds: Normal breath sounds.  Abdominal:     General: Abdomen is flat. Bowel sounds are normal. There is no distension.     Palpations: Abdomen is soft.     Tenderness: There is abdominal tenderness in the left lower quadrant. There is no guarding or rebound.     Hernia: No hernia is present.  Genitourinary:    Rectum: Normal. Guaiac result negative. No mass or tenderness.  Skin:    General: Skin is warm and dry.  Neurological:     General: No focal deficit present.     Mental Status: He is alert and oriented to  person, place, and time.       Assessment & Plan:  1. Diverticulitis -We will check CBC and BMP.  Hemoccult negative in the office today.  Symptoms consistent with diverticulitis.  Will  start on Augmentin 3 times daily x10 days.  Consider referring to gastroenterology - amoxicillin-clavulanate (AUGMENTIN) 875-125 MG tablet; Take 1 tablet by mouth in the morning, at noon, and at bedtime for 10 days.  Dispense: 30 tablet; Refill: 0 - Comprehensive metabolic panel; Future - CBC with Differential/Platelet; Future - CBC with Differential/Platelet - Comprehensive metabolic panel   Dorothyann Peng, NP

## 2020-09-13 ENCOUNTER — Other Ambulatory Visit: Payer: Self-pay | Admitting: Adult Health

## 2020-10-30 ENCOUNTER — Other Ambulatory Visit: Payer: Self-pay | Admitting: Orthopedic Surgery

## 2020-11-10 ENCOUNTER — Ambulatory Visit: Payer: 59 | Admitting: Dermatology

## 2020-11-10 ENCOUNTER — Other Ambulatory Visit: Payer: Self-pay

## 2020-11-10 ENCOUNTER — Encounter: Payer: Self-pay | Admitting: Dermatology

## 2020-11-10 DIAGNOSIS — Z1283 Encounter for screening for malignant neoplasm of skin: Secondary | ICD-10-CM | POA: Diagnosis not present

## 2020-11-10 DIAGNOSIS — L820 Inflamed seborrheic keratosis: Secondary | ICD-10-CM

## 2020-11-10 DIAGNOSIS — D485 Neoplasm of uncertain behavior of skin: Secondary | ICD-10-CM

## 2020-11-10 NOTE — Patient Instructions (Signed)

## 2020-11-10 NOTE — Progress Notes (Signed)
   Follow-Up Visit   Subjective  SWANSON FARNELL is a 65 y.o. male who presents for the following: Follow-up (3 month f/u- left breast- healing good no concerns).  General skin examination.  Follow-up of frozen spot left chest. Location:  Duration:  Quality:  Associated Signs/Symptoms: Modifying Factors:  Severity:  Timing: Context:   Objective  Well appearing patient in no apparent distress; mood and affect are within normal limits. No atypical pigmented lesions, 1 possible nonmelanoma skin cancer left chest will be biopsied.    Left Breast Hornlike 4 mm pink papule, irritated verruca rule out SCCA.       All skin waist up examined.   Assessment & Plan    Neoplasm of uncertain behavior of skin Left Breast  Skin / nail biopsy Type of biopsy: tangential   Informed consent: discussed and consent obtained   Timeout: patient name, date of birth, surgical site, and procedure verified   Anesthesia: the lesion was anesthetized in a standard fashion   Anesthetic:  1% lidocaine w/ epinephrine 1-100,000 local infiltration Instrument used: flexible razor blade   Hemostasis achieved with: ferric subsulfate   Outcome: patient tolerated procedure well   Post-procedure details: wound care instructions given    Specimen 1 - Surgical pathology Differential Diagnosis: scc vs bcc  Check Margins: No  Screening for malignant neoplasm of skin  Annual skin examination.Encouraged to self examine twice annually.  Continued ultraviolet protection.      I, Lavonna Monarch, MD, have reviewed all documentation for this visit.  The documentation on 11/10/20 for the exam, diagnosis, procedures, and orders are all accurate and complete.

## 2020-12-01 ENCOUNTER — Ambulatory Visit: Payer: 59 | Admitting: Physician Assistant

## 2020-12-01 ENCOUNTER — Encounter: Payer: Self-pay | Admitting: Physician Assistant

## 2020-12-01 ENCOUNTER — Other Ambulatory Visit: Payer: Self-pay

## 2020-12-01 DIAGNOSIS — D485 Neoplasm of uncertain behavior of skin: Secondary | ICD-10-CM

## 2020-12-01 DIAGNOSIS — Z808 Family history of malignant neoplasm of other organs or systems: Secondary | ICD-10-CM | POA: Diagnosis not present

## 2020-12-01 DIAGNOSIS — L910 Hypertrophic scar: Secondary | ICD-10-CM | POA: Diagnosis not present

## 2020-12-01 DIAGNOSIS — D0362 Melanoma in situ of left upper limb, including shoulder: Secondary | ICD-10-CM

## 2020-12-01 DIAGNOSIS — Z85828 Personal history of other malignant neoplasm of skin: Secondary | ICD-10-CM

## 2020-12-01 DIAGNOSIS — L570 Actinic keratosis: Secondary | ICD-10-CM | POA: Diagnosis not present

## 2020-12-01 DIAGNOSIS — Z1283 Encounter for screening for malignant neoplasm of skin: Secondary | ICD-10-CM | POA: Diagnosis not present

## 2020-12-01 MED ORDER — CLOBETASOL PROPIONATE 0.05 % EX OINT: TOPICAL_OINTMENT | CUTANEOUS | 4 refills | Status: DC

## 2020-12-01 NOTE — Patient Instructions (Signed)

## 2020-12-01 NOTE — Progress Notes (Signed)
   Follow-Up Visit   Subjective  Daniel Booth is a 65 y.o. male who presents for the following: Follow-up (6 month f/u - personal and family history of melanoma and non mole skin cancers. ).   The following portions of the chart were reviewed this encounter and updated as appropriate:  Tobacco  Allergies  Meds  Problems  Med Hx  Surg Hx  Fam Hx      Objective  Well appearing patient in no apparent distress; mood and affect are within normal limits.  A full examination was performed including scalp, head, eyes, ears, nose, lips, neck, chest, axillae, abdomen, back, buttocks, bilateral upper extremities, bilateral lower extremities, hands, feet, fingers, toes, fingernails, and toenails. All findings within normal limits unless otherwise noted below.  Left Palmar Proximal 3rd Finger Hyperpigmented thick scar s/p hand surgery.  Left Forearm - Posterior Bichromic dark nested macule.        Mid Occipital Scalp (2), Right Buccal Cheek Erythematous patches with gritty scale.   Assessment & Plan  Hypertrophic scar Left Palmar Proximal 3rd Finger  clobetasol ointment (TEMOVATE) 0.05 % - Left Palmar Proximal 3rd Finger Apply to affected area qd - not for face, folds or groin  Neoplasm of uncertain behavior of skin Left Forearm - Posterior  Skin / nail biopsy Type of biopsy: tangential   Informed consent: discussed and consent obtained   Timeout: patient name, date of birth, surgical site, and procedure verified   Procedure prep:  Patient was prepped and draped in usual sterile fashion (Non sterile) Prep type:  Chlorhexidine Anesthesia: the lesion was anesthetized in a standard fashion   Anesthetic:  1% lidocaine w/ epinephrine 1-100,000 local infiltration Instrument used: flexible razor blade   Outcome: patient tolerated procedure well   Post-procedure details: wound care instructions given    Specimen 1 - Surgical pathology Differential Diagnosis: r/o atypia  Check  Margins: No  AK (actinic keratosis) (3) Mid Occipital Scalp (2); Right Buccal Cheek  Destruction of lesion - Mid Occipital Scalp, Right Buccal Cheek Complexity: simple   Destruction method: cryotherapy   Informed consent: discussed and consent obtained   Timeout:  patient name, date of birth, surgical site, and procedure verified Lesion destroyed using liquid nitrogen: Yes   Cryotherapy cycles:  3 Outcome: patient tolerated procedure well with no complications    Related Medications Fluorouracil (TOLAK) 4 % CREA Apply 1 application topically at bedtime. Apply qhs Monday - Sunday x 2 weeks    I, Venson Ferencz, PA-C, have reviewed all documentation's for this visit.  The documentation on 12/01/20 for the exam, diagnosis, procedures and orders are all accurate and complete.

## 2020-12-08 ENCOUNTER — Telehealth: Payer: Self-pay | Admitting: Physician Assistant

## 2020-12-08 NOTE — Telephone Encounter (Signed)
Called patient to give him his pathology results. We scheduled a  surgery to excise the lesion.

## 2020-12-08 NOTE — Telephone Encounter (Signed)
Patient left message on office voice mail saying that he had a question about his pathology results and the upcoming procedure with Robyne Askew, PA-C.

## 2020-12-09 NOTE — Telephone Encounter (Signed)
[place] office

## 2020-12-12 ENCOUNTER — Other Ambulatory Visit: Payer: Self-pay | Admitting: Adult Health

## 2020-12-30 ENCOUNTER — Other Ambulatory Visit: Payer: Self-pay

## 2020-12-30 ENCOUNTER — Encounter: Payer: Self-pay | Admitting: Physician Assistant

## 2020-12-30 ENCOUNTER — Ambulatory Visit (INDEPENDENT_AMBULATORY_CARE_PROVIDER_SITE_OTHER): Payer: 59 | Admitting: Physician Assistant

## 2020-12-30 DIAGNOSIS — D0360 Melanoma in situ of unspecified upper limb, including shoulder: Secondary | ICD-10-CM

## 2020-12-30 DIAGNOSIS — D0362 Melanoma in situ of left upper limb, including shoulder: Secondary | ICD-10-CM

## 2020-12-30 MED ORDER — SULFAMETHOXAZOLE-TRIMETHOPRIM 800-160 MG PO TABS: 1.0000 | ORAL_TABLET | Freq: Two times a day (BID) | ORAL | 0 refills | Status: DC

## 2020-12-30 NOTE — Progress Notes (Signed)
   Follow-Up Visit   Subjective  Daniel Booth is a 65 y.o. male who presents for the following: Procedure (Patient here today for treatment of MIS on left forearm posterior. ).   The following portions of the chart were reviewed this encounter and updated as appropriate:  Tobacco  Allergies  Meds  Problems  Med Hx  Surg Hx  Fam Hx      Objective  Well appearing patient in no apparent distress; mood and affect are within normal limits.  A focused examination was performed including left upper extremity. Relevant physical exam findings are noted in the Assessment and Plan.  Left Forearm - Posterior Pink macule 3-0 vicryl- x 5 4-0 nylon- 10 running suture  Assessment & Plan  Melanoma in situ of upper extremity, including shoulder, unspecified laterality (HCC) Left Forearm - Posterior  Skin excision  Margin per side (cm):  0.5 Total excision diameter (cm):  5 Informed consent: discussed and consent obtained   Timeout: patient name, date of birth, surgical site, and procedure verified   Anesthesia: the lesion was anesthetized in a standard fashion   Anesthetic:  1% lidocaine w/ epinephrine 1-100,000 local infiltration Instrument used: #15 blade   Hemostasis achieved with: pressure and electrodesiccation   Outcome: patient tolerated procedure well with no complications   Post-procedure details: sterile dressing applied and wound care instructions given   Dressing type: bandage, petrolatum and pressure dressing   Additional details:  3-0 vicryl x 5 4-0 nylon x 10 running suture  sulfamethoxazole-trimethoprim (BACTRIM DS) 800-160 MG tablet Take 1 tablet by mouth 2 (two) times daily.  Specimen 1 - Surgical pathology Differential Diagnosis: Melanoma in situ [id number] Check Margins: YES medial margin stain   I, Vonda Harth, PA-C, have reviewed all documentation's for this visit.  The documentation on 12/30/20 for the exam, diagnosis, procedures and orders are all  accurate and complete.

## 2020-12-30 NOTE — Patient Instructions (Signed)

## 2021-01-11 ENCOUNTER — Telehealth: Payer: Self-pay

## 2021-01-11 NOTE — Telephone Encounter (Signed)
Path to patient and he will make 6 month follow up  at nurse visit.

## 2021-01-11 NOTE — Telephone Encounter (Signed)
-----   Message from Warren Danes, [place] sent at 01/07/2021  9:18 AM EDT ----- Margins free..The patient is asked to return in 6 months for full skin exam.

## 2021-01-13 ENCOUNTER — Other Ambulatory Visit: Payer: Self-pay

## 2021-01-13 ENCOUNTER — Ambulatory Visit (INDEPENDENT_AMBULATORY_CARE_PROVIDER_SITE_OTHER): Payer: 59 | Admitting: *Deleted

## 2021-01-13 DIAGNOSIS — Z4802 Encounter for removal of sutures: Secondary | ICD-10-CM

## 2021-01-13 MED ORDER — MUPIROCIN 2 % EX OINT: 1.0000 "application " | TOPICAL_OINTMENT | Freq: Two times a day (BID) | CUTANEOUS | 0 refills | Status: DC

## 2021-01-13 NOTE — Progress Notes (Signed)
Here for NTS suture removal. No signs or symptoms of infection. Path to patient. 

## 2021-02-17 ENCOUNTER — Ambulatory Visit: Payer: 59 | Admitting: Adult Health

## 2021-02-17 ENCOUNTER — Other Ambulatory Visit: Payer: Self-pay

## 2021-02-17 ENCOUNTER — Encounter: Payer: Self-pay | Admitting: Adult Health

## 2021-02-17 VITALS — BP 106/82 | HR 78 | Temp 98.7°F | Ht 70.0 in | Wt 204.0 lb

## 2021-02-17 DIAGNOSIS — J029 Acute pharyngitis, unspecified: Secondary | ICD-10-CM | POA: Diagnosis not present

## 2021-02-17 MED ORDER — PREDNISONE 10 MG PO TABS: 10.0000 mg | ORAL_TABLET | Freq: Every day | ORAL | 0 refills | Status: DC

## 2021-02-17 MED ORDER — DOXYCYCLINE HYCLATE 100 MG PO CAPS: 100.0000 mg | ORAL_CAPSULE | Freq: Two times a day (BID) | ORAL | 0 refills | Status: DC

## 2021-02-17 NOTE — Progress Notes (Signed)
Subjective:    Patient ID: Daniel Booth, male    DOB: May 31, 1955, 65 y.o.   MRN: [id number]  HPI 65 year old male who  has a past medical history of Allergy, Arthritis, Basal cell carcinoma (62/13/0865), Complication of anesthesia (2011), Diverticulitis, Diverticulosis, GERD (gastroesophageal reflux disease), History of Barrett's esophagus, History of kidney stones, Hyperlipidemia, Melanoma ([place]) (10/25/2011), Melanoma ([place]) (07/05/2011), Prostate cancer ([place]), and Squamous cell carcinoma of skin (05/17/2019).  He presents to the office today for an acute issues.  His symptoms started roughly 3 days ago.  Symptoms include sore throat, "a knot "on his right lower neck that is painful to touch, feeling lethargic, diaphoresis, and chills.  Pain is worse when he clears his throat, coughs, swallows, or sneezes.  No other sick contacts at home   Review of Systems See HPI   Past Medical History:  Diagnosis Date   Allergy    Arthritis    Basal cell carcinoma 04/03/2018   nod-left axilla- ([id number])   Complication of anesthesia 2011   "vitals dropped with endoscopy"   Diverticulitis    Diverticulosis    GERD (gastroesophageal reflux disease)    on omeprazole   History of Barrett's esophagus    History of kidney stones    10-15 yrs ago   Hyperlipidemia    Melanoma ([place]) 10/25/2011   in situ-Left post scalp (MOHS)   Melanoma ([place]) 07/05/2011   in situ-Right ant. thigh- (EXC)   Prostate cancer ([place])    Squamous cell carcinoma of skin 05/17/2019   in situ-right forarm- (CX35FU)    Social History   Socioeconomic History   Marital status: Married    Spouse name: Not on file   Number of children: Not on file   Years of education: Not on file   Highest education level: Not on file  Occupational History   Not on file  Tobacco Use   Smoking status: Former    Packs/day: 0.25    Types: Cigarettes    Quit date: 04/27/1982    Years since quitting: 38.8   Smokeless tobacco: Never   Vaping Use   Vaping Use: Never used  Substance and Sexual Activity   Alcohol use: Yes    Alcohol/week: 4.0 standard drinks    Types: 4 Glasses of wine per week   Drug use: No   Sexual activity: Not on file  Other Topics Concern   Not on file  Social History Narrative   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    Married    No kids       Social Determinants of Radio broadcast assistant Strain: Not on file  Food Insecurity: Not on file  Transportation Needs: Not on file  Physical Activity: Not on file  Stress: Not on file  Social Connections: Not on file  Intimate Partner Violence: Not on file    Past Surgical History:  Procedure Laterality Date   COLONOSCOPY     LAMINECTOMY AND MICRODISCECTOMY LUMBAR SPINE  2019   MOHS SURGERY Right    scalp   PROSTATE SURGERY  2010   SHOULDER ARTHROSCOPY  2007   right   TOTAL HIP ARTHROPLASTY Left 05/10/2016   Procedure: TOTAL HIP ARTHROPLASTY;  Surgeon: Garald Balding, MD;  Location: [facility];  Service: Orthopedics;  Laterality: Left;   UPPER GASTROINTESTINAL ENDOSCOPY      Family History  Problem Relation Age of Onset   Colon polyps Father    Diabetes Father  Melanoma Mother    Melanoma Maternal Grandmother    Colon cancer Neg Hx    Esophageal cancer Neg Hx    Rectal cancer Neg Hx    Stomach cancer Neg Hx     Allergies  Allergen Reactions   Bactrim [Sulfamethoxazole-Trimethoprim] Other (See Comments)    Patient felt bad with chills and sweating.   No Known Allergies     Current Outpatient Medications on File Prior to Visit  Medication Sig Dispense Refill   aspirin 81 MG tablet Take 81 mg by mouth daily.     atorvastatin (LIPITOR) 20 MG tablet TAKE 1 TABLET BY MOUTH EVERY DAY 90 tablet 0   Calcium-Magnesium (CAL-MAG PO) Take 1 tablet by mouth daily.     clobetasol ointment (TEMOVATE) 0.05 % Apply to affected area qd - not for face, folds or groin 30 g 4   Coenzyme Q10 (CO Q10) 60 MG CAPS Take 1 tablet by mouth  daily.     Crisaborole (EUCRISA) 2 % OINT Eucrisa 2 % topical ointment     Fluorouracil (TOLAK) 4 % CREA Apply 1 application topically at bedtime. Apply qhs Monday - Sunday x 2 weeks 40 g 2   fluticasone (FLONASE) 50 MCG/ACT nasal spray USE AS DIRECTED 32 g 6   Halobetasol Propionate (ULTRAVATE) 0.05 % LOTN Apply 1 application topically 2 (two) times daily. 60 mL 2   ibuprofen (ADVIL) 200 MG tablet Take 200 mg by mouth daily.     Multiple Vitamin (MULTIVITAMIN) tablet Take 1 tablet by mouth daily.     mupirocin ointment (BACTROBAN) 2 % Apply 1 application topically 2 (two) times daily. 22 g 0   Omega-3 1000 MG CAPS Take 1 capsule by mouth daily.     omeprazole (PRILOSEC) 20 MG capsule Take 1 capsule (20 mg total) by mouth daily. 90 capsule 3   sulfamethoxazole-trimethoprim (BACTRIM DS) 800-160 MG tablet Take 1 tablet by mouth 2 (two) times daily. 20 tablet 0   tadalafil (CIALIS) 5 MG tablet Take 1 tablet (5 mg total) by mouth daily. as directed 30 tablet 6   triamcinolone cream (KENALOG) 0.1 % APPLY TO AFFECTED AREA 1 2 TIMES A DAY     No current facility-administered medications on file prior to visit.    BP 106/82   Pulse 78   Temp 98.7 F (37.1 C) (Oral)   Ht 5\' 10"  (1.778 m)   Wt 204 lb (92.5 kg)   SpO2 96%   BMI 29.27 kg/m       Objective:   Physical Exam Vitals and nursing note reviewed.  Constitutional:      Appearance: Normal appearance. He is ill-appearing.  HENT:     Mouth/Throat:     Lips: Pink.     Mouth: Mucous membranes are pale and dry.     Tongue: No lesions.     Palate: No mass and lesions.     Pharynx: Oropharynx is clear. Uvula midline. Posterior oropharyngeal erythema present. No pharyngeal swelling.     Tonsils: No tonsillar exudate or tonsillar abscesses.  Neck:     Thyroid: No thyroid mass, thyromegaly or thyroid tenderness.  Cardiovascular:     Rate and Rhythm: Normal rate and regular rhythm.     Pulses: Normal pulses.     Heart sounds: Normal  heart sounds.  Pulmonary:     Effort: Pulmonary effort is normal.     Breath sounds: Normal breath sounds.  Musculoskeletal:     Cervical back: Full passive  range of motion without pain and normal range of motion. No erythema or rigidity.  Lymphadenopathy:     Head:     Right side of head: Submandibular adenopathy present. No tonsillar, preauricular, posterior auricular or occipital adenopathy.     Cervical: No cervical adenopathy.  Neurological:     Mental Status: He is alert.      Assessment & Plan:  1. Sore throat -Strep negative.  He does have quite large and tender swollen lymph node right lower neck.  We will treat with prophylactic doxycycline and prednisone.  Unable to get CBC today as our lab is closed during this visit.  We will have him follow-up next week for further evaluation - doxycycline (VIBRAMYCIN) 100 MG capsule; Take 1 capsule (100 mg total) by mouth 2 (two) times daily.  Dispense: 14 capsule; Refill: 0 - predniSONE (DELTASONE) 10 MG tablet; Take 1 tablet (10 mg total) by mouth daily with breakfast.  Dispense: 5 tablet; Refill: 0 - POC Rapid Strep A   Dorothyann Peng, NP

## 2021-02-18 LAB — POCT RAPID STREP A (OFFICE): Rapid Strep A Screen: NEGATIVE

## 2021-02-25 ENCOUNTER — Ambulatory Visit: Payer: 59 | Admitting: Adult Health

## 2021-03-04 ENCOUNTER — Other Ambulatory Visit: Payer: Self-pay

## 2021-03-05 ENCOUNTER — Ambulatory Visit: Payer: 59 | Admitting: Adult Health

## 2021-03-05 ENCOUNTER — Encounter: Payer: Self-pay | Admitting: Adult Health

## 2021-03-05 VITALS — BP 118/84 | HR 78 | Temp 98.7°F | Ht 70.0 in | Wt 201.0 lb

## 2021-03-05 DIAGNOSIS — T148XXA Other injury of unspecified body region, initial encounter: Secondary | ICD-10-CM

## 2021-03-05 DIAGNOSIS — R599 Enlarged lymph nodes, unspecified: Secondary | ICD-10-CM | POA: Diagnosis not present

## 2021-03-05 MED ORDER — CYCLOBENZAPRINE HCL 10 MG PO TABS: 10.0000 mg | ORAL_TABLET | Freq: Three times a day (TID) | ORAL | 0 refills | Status: DC | PRN

## 2021-03-05 NOTE — Progress Notes (Signed)
Subjective:    Patient ID: Daniel Booth, male    DOB: April 18, 1956, 65 y.o.   MRN: [id number]  HPI  65 year old male who  has a past medical history of Allergy, Arthritis, Basal cell carcinoma (52/84/1324), Complication of anesthesia (2011), Diverticulitis, Diverticulosis, GERD (gastroesophageal reflux disease), History of Barrett's esophagus, History of kidney stones, Hyperlipidemia, Melanoma ([place]) (10/25/2011), Melanoma ([place]) (07/05/2011), Prostate cancer ([place]), and Squamous cell carcinoma of skin (05/17/2019).  He presents to the office today for follow-up and also another acute issue.  Was seen roughly 3 weeks ago for sore throat and a "knot" on his lower right neck that was painful to touch.  At this time he is feeling lethargic, was having diaphoresis and chills.  His rapid strep was negative but he did have large swollen lymph node on his right lower neck.  He was treated with doxycycline and prednisone.  Reports that he has finished his treatment and is feeling much better just wants to make sure that the lymph node is no longer enlarged.  Additionally over the last week or so he has been experiencing right sided upper back/neck pain that is more apparent when he looks towards his right shoulder.  Denies injury or trauma.  Review of Systems See HPI   Past Medical History:  Diagnosis Date   Allergy    Arthritis    Basal cell carcinoma 04/03/2018   nod-left axilla- ([id number])   Complication of anesthesia 2011   "vitals dropped with endoscopy"   Diverticulitis    Diverticulosis    GERD (gastroesophageal reflux disease)    on omeprazole   History of Barrett's esophagus    History of kidney stones    10-15 yrs ago   Hyperlipidemia    Melanoma ([place]) 10/25/2011   in situ-Left post scalp (MOHS)   Melanoma ([place]) 07/05/2011   in situ-Right ant. thigh- (EXC)   Prostate cancer ([place])    Squamous cell carcinoma of skin 05/17/2019   in situ-right forarm- (CX35FU)    Social History    Socioeconomic History   Marital status: Married    Spouse name: Not on file   Number of children: Not on file   Years of education: Not on file   Highest education level: Not on file  Occupational History   Not on file  Tobacco Use   Smoking status: Former    Packs/day: 0.25    Types: Cigarettes    Quit date: 04/27/1982    Years since quitting: 38.8   Smokeless tobacco: Never  Vaping Use   Vaping Use: Never used  Substance and Sexual Activity   Alcohol use: Yes    Alcohol/week: 4.0 standard drinks    Types: 4 Glasses of wine per week   Drug use: No   Sexual activity: Not on file  Other Topics Concern   Not on file  Social History Narrative   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    Married    No kids       Social Determinants of Radio broadcast assistant Strain: Not on file  Food Insecurity: Not on file  Transportation Needs: Not on file  Physical Activity: Not on file  Stress: Not on file  Social Connections: Not on file  Intimate Partner Violence: Not on file    Past Surgical History:  Procedure Laterality Date   COLONOSCOPY     LAMINECTOMY AND MICRODISCECTOMY LUMBAR SPINE  2019   MOHS SURGERY Right  scalp   PROSTATE SURGERY  2010   SHOULDER ARTHROSCOPY  2007   right   TOTAL HIP ARTHROPLASTY Left 05/10/2016   Procedure: TOTAL HIP ARTHROPLASTY;  Surgeon: Garald Balding, MD;  Location: [facility];  Service: Orthopedics;  Laterality: Left;   UPPER GASTROINTESTINAL ENDOSCOPY      Family History  Problem Relation Age of Onset   Colon polyps Father    Diabetes Father    Melanoma Mother    Melanoma Maternal Grandmother    Colon cancer Neg Hx    Esophageal cancer Neg Hx    Rectal cancer Neg Hx    Stomach cancer Neg Hx     Allergies  Allergen Reactions   Bactrim [Sulfamethoxazole-Trimethoprim] Other (See Comments)    Patient felt bad with chills and sweating.   No Known Allergies     Current Outpatient Medications on File Prior to Visit   Medication Sig Dispense Refill   aspirin 81 MG tablet Take 81 mg by mouth daily.     atorvastatin (LIPITOR) 20 MG tablet TAKE 1 TABLET BY MOUTH EVERY DAY 90 tablet 0   Calcium-Magnesium (CAL-MAG PO) Take 1 tablet by mouth daily.     clobetasol ointment (TEMOVATE) 0.05 % Apply to affected area qd - not for face, folds or groin 30 g 4   Coenzyme Q10 (CO Q10) 60 MG CAPS Take 1 tablet by mouth daily.     Crisaborole (EUCRISA) 2 % OINT Eucrisa 2 % topical ointment     doxycycline (VIBRAMYCIN) 100 MG capsule Take 1 capsule (100 mg total) by mouth 2 (two) times daily. 14 capsule 0   Fluorouracil (TOLAK) 4 % CREA Apply 1 application topically at bedtime. Apply qhs Monday - Sunday x 2 weeks 40 g 2   fluticasone (FLONASE) 50 MCG/ACT nasal spray USE AS DIRECTED 32 g 6   Halobetasol Propionate (ULTRAVATE) 0.05 % LOTN Apply 1 application topically 2 (two) times daily. 60 mL 2   ibuprofen (ADVIL) 200 MG tablet Take 200 mg by mouth daily.     Multiple Vitamin (MULTIVITAMIN) tablet Take 1 tablet by mouth daily.     mupirocin ointment (BACTROBAN) 2 % Apply 1 application topically 2 (two) times daily. 22 g 0   Omega-3 1000 MG CAPS Take 1 capsule by mouth daily.     omeprazole (PRILOSEC) 20 MG capsule Take 1 capsule (20 mg total) by mouth daily. 90 capsule 3   predniSONE (DELTASONE) 10 MG tablet Take 1 tablet (10 mg total) by mouth daily with breakfast. 5 tablet 0   tadalafil (CIALIS) 5 MG tablet Take 1 tablet (5 mg total) by mouth daily. as directed 30 tablet 6   triamcinolone cream (KENALOG) 0.1 % APPLY TO AFFECTED AREA 1 2 TIMES A DAY     No current facility-administered medications on file prior to visit.    BP 118/84   Pulse 78   Temp 98.7 F (37.1 C) (Oral)   Ht 5\' 10"  (1.778 m)   Wt 201 lb (91.2 kg)   SpO2 98%   BMI 28.84 kg/m       Objective:   Physical Exam Vitals and nursing note reviewed.  Constitutional:      Appearance: Normal appearance.  Musculoskeletal:        General:  Tenderness present.     Cervical back: Decreased range of motion.     Comments: Right sided trapezius is quite tight and tender with palpation  Lymphadenopathy:     Head:  Right side of head: No submental, submandibular, tonsillar, preauricular, posterior auricular or occipital adenopathy.     Cervical: No cervical adenopathy.     Right cervical: No superficial, deep or posterior cervical adenopathy. Skin:    General: Skin is warm and dry.     Capillary Refill: Capillary refill takes less than 2 seconds.  Neurological:     General: No focal deficit present.     Mental Status: He is alert and oriented to person, place, and time.  Psychiatric:        Mood and Affect: Mood normal.        Behavior: Behavior normal.        Thought Content: Thought content normal.        Judgment: Judgment normal.      Assessment & Plan:  1. Enlarged lymph node -Has resolved.  No further management needed  2. Muscle strain -Muscular in origin.  Will prescribe Flexeril.  Advised warm compress and gentle stretching exercises.  Follow-up as needed - cyclobenzaprine (FLEXERIL) 10 MG tablet; Take 1 tablet (10 mg total) by mouth 3 (three) times daily as needed for muscle spasms.  Dispense: 30 tablet; Refill: 0  Dorothyann Peng, NP

## 2021-05-02 HISTORY — PX: OTHER SURGICAL HISTORY: SHX169

## 2021-05-19 ENCOUNTER — Other Ambulatory Visit: Payer: Self-pay

## 2021-05-19 ENCOUNTER — Ambulatory Visit: Payer: 59 | Admitting: Physician Assistant

## 2021-05-19 ENCOUNTER — Encounter: Payer: Self-pay | Admitting: Physician Assistant

## 2021-05-19 DIAGNOSIS — L570 Actinic keratosis: Secondary | ICD-10-CM | POA: Diagnosis not present

## 2021-05-19 DIAGNOSIS — Z808 Family history of malignant neoplasm of other organs or systems: Secondary | ICD-10-CM | POA: Diagnosis not present

## 2021-05-19 DIAGNOSIS — D485 Neoplasm of uncertain behavior of skin: Secondary | ICD-10-CM

## 2021-05-19 DIAGNOSIS — D2262 Melanocytic nevi of left upper limb, including shoulder: Secondary | ICD-10-CM

## 2021-05-19 DIAGNOSIS — Z1283 Encounter for screening for malignant neoplasm of skin: Secondary | ICD-10-CM | POA: Diagnosis not present

## 2021-05-19 DIAGNOSIS — Z85828 Personal history of other malignant neoplasm of skin: Secondary | ICD-10-CM | POA: Diagnosis not present

## 2021-05-19 DIAGNOSIS — Z85820 Personal history of malignant melanoma of skin: Secondary | ICD-10-CM | POA: Diagnosis not present

## 2021-05-19 NOTE — Patient Instructions (Signed)

## 2021-05-24 ENCOUNTER — Encounter: Payer: Self-pay | Admitting: Physician Assistant

## 2021-05-24 NOTE — Progress Notes (Signed)
° °  Follow-Up Visit   Subjective  Daniel Booth is a 66 y.o. male who presents for the following: Follow-up (6 month f/u- right lower leg- x 1 months- no itch, no bleed- tender to touch, few scaly spots on scaly, left hand x 4-6 weeks- comes and goes, back- red mark that itches and swells but seems to be healing now. Personal history of melanoma and non mole skin cancers. Family history of melanoma. ).   The following portions of the chart were reviewed this encounter and updated as appropriate:  Tobacco   Allergies   Meds   Problems   Med Hx   Surg Hx   Fam Hx       Objective  Well appearing patient in no apparent distress; mood and affect are within normal limits.  A full examination was performed including scalp, head, eyes, ears, nose, lips, neck, chest, axillae, abdomen, back, buttocks, bilateral upper extremities, bilateral lower extremities, hands, feet, fingers, toes, fingernails, and toenails. All findings within normal limits unless otherwise noted below.  Left Dorsal Hand Pink papule       Mid Parietal Scalp (3) Erythematous patches with gritty scale.   Assessment & Plan  Neoplasm of uncertain behavior of skin Left Dorsal Hand  Skin / nail biopsy Type of biopsy: tangential   Informed consent: discussed and consent obtained   Timeout: patient name, date of birth, surgical site, and procedure verified   Procedure prep:  Patient was prepped and draped in usual sterile fashion (Non sterile) Prep type:  Chlorhexidine Anesthesia: the lesion was anesthetized in a standard fashion   Anesthetic:  1% lidocaine w/ epinephrine 1-100,000 local infiltration Instrument used: flexible razor blade   Outcome: patient tolerated procedure well   Post-procedure details: wound care instructions given    Specimen 1 - Surgical pathology Differential Diagnosis: bcc vs scc  Check Margins: No  AK (actinic keratosis) (3) Mid Parietal Scalp  Destruction of lesion - Mid Parietal  Scalp Complexity: simple   Destruction method: cryotherapy   Informed consent: discussed and consent obtained   Timeout:  patient name, date of birth, surgical site, and procedure verified Lesion destroyed using liquid nitrogen: Yes   Cryotherapy cycles:  3 Outcome: patient tolerated procedure well with no complications    Related Medications Fluorouracil (TOLAK) 4 % CREA Apply 1 application topically at bedtime. Apply qhs Monday - Sunday x 2 weeks    I, Media Pizzini, PA-C, have reviewed all documentation's for this visit.  The documentation on 05/24/21 for the exam, diagnosis, procedures and orders are all accurate and complete.

## 2021-05-25 ENCOUNTER — Telehealth: Payer: Self-pay

## 2021-05-25 NOTE — Telephone Encounter (Signed)
Phone call to patient with his pathology results. Patient aware of results.  

## 2021-05-25 NOTE — Telephone Encounter (Signed)
-----   Message from Warren Danes, [place] sent at 05/24/2021  3:47 PM EST ----- Normal mole.

## 2021-06-03 ENCOUNTER — Other Ambulatory Visit: Payer: Self-pay

## 2021-06-03 ENCOUNTER — Ambulatory Visit (INDEPENDENT_AMBULATORY_CARE_PROVIDER_SITE_OTHER): Payer: 59

## 2021-06-03 ENCOUNTER — Encounter: Payer: Self-pay | Admitting: Adult Health

## 2021-06-03 ENCOUNTER — Ambulatory Visit: Payer: 59 | Admitting: Adult Health

## 2021-06-03 VITALS — BP 120/80 | HR 74 | Temp 98.0°F | Ht 70.0 in | Wt 206.0 lb

## 2021-06-03 DIAGNOSIS — M79605 Pain in left leg: Secondary | ICD-10-CM | POA: Diagnosis not present

## 2021-06-03 NOTE — Progress Notes (Signed)
Subjective:    Patient ID: Daniel Booth, male    DOB: January 25, 1956, 66 y.o.   MRN: [id number]  HPI 66 year old male who  has a past medical history of Allergy, Arthritis, Basal cell carcinoma (71/09/2692), Complication of anesthesia (2011), Diverticulitis, Diverticulosis, GERD (gastroesophageal reflux disease), History of Barrett's esophagus, History of kidney stones, Hyperlipidemia, Melanoma ([place]) (10/25/2011), Melanoma ([place]) (07/05/2011), Prostate cancer ([place]), and Squamous cell carcinoma of skin (05/17/2019).  He presents to the office today for an acute issue.  Reports about 4 weeks had a sudden onset pain to the right leg on the lateral aspect.  First noticed during a leg massage during his pedicure.  Area is sensitive to touch, reports "when touch a certain spot that causes an electrical shock up my leg."  He has noticed some minor swelling to the area in question.  No redness or warmth.  Denies trauma or aggravating injury   Review of Systems See HPI   Past Medical History:  Diagnosis Date   Allergy    Arthritis    Basal cell carcinoma 04/03/2018   nod-left axilla- ([id number])   Complication of anesthesia 2011   "vitals dropped with endoscopy"   Diverticulitis    Diverticulosis    GERD (gastroesophageal reflux disease)    on omeprazole   History of Barrett's esophagus    History of kidney stones    10-15 yrs ago   Hyperlipidemia    Melanoma ([place]) 10/25/2011   in situ-Left post scalp (MOHS)   Melanoma ([place]) 07/05/2011   in situ-Right ant. thigh- (EXC)   Prostate cancer ([place])    Squamous cell carcinoma of skin 05/17/2019   in situ-right forarm- ([id number])    Social History   Socioeconomic History   Marital status: Married    Spouse name: Not on file   Number of children: Not on file   Years of education: Not on file   Highest education level: Not on file  Occupational History   Not on file  Tobacco Use   Smoking status: Former    Packs/day: 0.25    Types:  Cigarettes    Quit date: 04/27/1982    Years since quitting: 39.1   Smokeless tobacco: Never  Vaping Use   Vaping Use: Never used  Substance and Sexual Activity   Alcohol use: Yes    Alcohol/week: 4.0 standard drinks    Types: 4 Glasses of wine per week   Drug use: No   Sexual activity: Not on file  Other Topics Concern   Not on file  Social History Narrative   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    Married    No kids       Social Determinants of Radio broadcast assistant Strain: Not on file  Food Insecurity: Not on file  Transportation Needs: Not on file  Physical Activity: Not on file  Stress: Not on file  Social Connections: Not on file  Intimate Partner Violence: Not on file    Past Surgical History:  Procedure Laterality Date   COLONOSCOPY     LAMINECTOMY AND MICRODISCECTOMY LUMBAR SPINE  2019   MOHS SURGERY Right    scalp   PROSTATE SURGERY  2010   SHOULDER ARTHROSCOPY  2007   right   TOTAL HIP ARTHROPLASTY Left 05/10/2016   Procedure: TOTAL HIP ARTHROPLASTY;  Surgeon: Garald Balding, MD;  Location: [facility];  Service: Orthopedics;  Laterality: Left;   UPPER GASTROINTESTINAL ENDOSCOPY  Family History  Problem Relation Age of Onset   Colon polyps Father    Diabetes Father    Melanoma Mother    Melanoma Maternal Grandmother    Colon cancer Neg Hx    Esophageal cancer Neg Hx    Rectal cancer Neg Hx    Stomach cancer Neg Hx     Allergies  Allergen Reactions   Bactrim [Sulfamethoxazole-Trimethoprim] Other (See Comments)    Patient felt bad with chills and sweating.   No Known Allergies     Current Outpatient Medications on File Prior to Visit  Medication Sig Dispense Refill   aspirin 81 MG tablet Take 81 mg by mouth daily.     atorvastatin (LIPITOR) 20 MG tablet TAKE 1 TABLET BY MOUTH EVERY DAY 90 tablet 0   Calcium-Magnesium (CAL-MAG PO) Take 1 tablet by mouth daily.     clobetasol ointment (TEMOVATE) 0.05 % Apply to affected area qd -  not for face, folds or groin 30 g 4   Coenzyme Q10 (CO Q10) 60 MG CAPS Take 1 tablet by mouth daily.     Crisaborole (EUCRISA) 2 % OINT Eucrisa 2 % topical ointment     Fluorouracil (TOLAK) 4 % CREA Apply 1 application topically at bedtime. Apply qhs Monday - Sunday x 2 weeks 40 g 2   fluticasone (FLONASE) 50 MCG/ACT nasal spray USE AS DIRECTED 32 g 6   Halobetasol Propionate (ULTRAVATE) 0.05 % LOTN Apply 1 application topically 2 (two) times daily. 60 mL 2   ibuprofen (ADVIL) 200 MG tablet Take 200 mg by mouth daily.     Multiple Vitamin (MULTIVITAMIN) tablet Take 1 tablet by mouth daily.     Omega-3 1000 MG CAPS Take 1 capsule by mouth daily.     omeprazole (PRILOSEC) 20 MG capsule Take 1 capsule (20 mg total) by mouth daily. 90 capsule 3   triamcinolone cream (KENALOG) 0.1 % APPLY TO AFFECTED AREA 1 2 TIMES A DAY     No current facility-administered medications on file prior to visit.    BP 120/80    Pulse 74    Temp 98 F (36.7 C) (Oral)    Ht 5\' 10"  (1.778 m)    Wt 206 lb (93.4 kg)    SpO2 96%    BMI 29.56 kg/m       Objective:   Physical Exam Vitals and nursing note reviewed.  Constitutional:      Appearance: Normal appearance.  Musculoskeletal:        General: Swelling and tenderness present.     Comments: Has tenderness along peroneus longus tendon from above ankle to mid lower leg.  Trace swelling noted.    Neurological:     General: No focal deficit present.     Mental Status: He is alert and oriented to person, place, and time.  Psychiatric:        Mood and Affect: Mood normal.        Behavior: Behavior normal.        Thought Content: Thought content normal.        Judgment: Judgment normal.      Assessment & Plan:  1. Left leg pain -Likely tendinopathy.  Due to sudden nature will get x-ray to look for bony abnormality.  No concern for DVT at this time.  Consider advanced imaging in the future.  Advise conservative measures such as anti-inflammatories 3 times a  day, rest, gentle stretching exercises, elevation, and heat - DG Tibia/Fibula Right; Future  Dorothyann Peng, NP

## 2021-06-08 ENCOUNTER — Ambulatory Visit: Payer: 59 | Admitting: Physician Assistant

## 2021-06-30 HISTORY — PX: CARPAL TUNNEL RELEASE: SHX101

## 2021-07-31 DIAGNOSIS — M20011 Mallet finger of right finger(s): Secondary | ICD-10-CM

## 2021-07-31 HISTORY — DX: Mallet finger of right finger(s): M20.011

## 2021-09-13 ENCOUNTER — Telehealth: Payer: Self-pay | Admitting: Adult Health

## 2021-09-13 NOTE — Telephone Encounter (Signed)
Pt is out of town, c/o congestion, cough, sinus pressure, lethargic x 2 weeks  was prescribed an antibiotic while on a cruise (amoxacillin), taking OTC Claritin, alka seltzer and Flonase with no relief. Pt requesting an antibiotic or prednisone to help get past this. Request prescription  be sent to   [facility] [place] [address]  [phone] ?

## 2021-09-14 NOTE — Telephone Encounter (Signed)
Can you please schedule pt for a virtual visit? ?

## 2021-09-16 NOTE — Telephone Encounter (Signed)
Pt has an virtual 09-17-2021 with cory

## 2021-09-17 ENCOUNTER — Telehealth (INDEPENDENT_AMBULATORY_CARE_PROVIDER_SITE_OTHER): Payer: 59 | Admitting: Adult Health

## 2021-09-17 DIAGNOSIS — J988 Other specified respiratory disorders: Secondary | ICD-10-CM | POA: Diagnosis not present

## 2021-09-17 MED ORDER — PREDNISONE 10 MG PO TABS: ORAL_TABLET | ORAL | 0 refills | Status: DC

## 2021-09-17 MED ORDER — LEVOFLOXACIN 500 MG PO TABS: 500.0000 mg | ORAL_TABLET | Freq: Every day | ORAL | 0 refills | Status: DC

## 2021-09-17 NOTE — Progress Notes (Signed)
Virtual Visit via Video Note  I connected with Daniel Booth on 09/17/21 at 11:30 AM EDT by a video enabled telemedicine application and verified that I am speaking with the correct person using two identifiers.  Location patient: home Location provider:work or home office Persons participating in the virtual visit: patient, provider  I discussed the limitations of evaluation and management by telemedicine and the availability of in person appointments. The patient expressed understanding and agreed to proceed.   HPI: He is being evaluated today for an acute issue. His symptoms have been present for two weeks roughly. His symptoms started when he was in [place] and was treated with Amoxcillin.  He did test negative for COVID and influenza while in [place].  He finished his antibiotic therapy last week but continues to have symptoms and feels as though the amoxicillin really did not help much.  Symptoms include nasal congestion, sinus pressure, semiproductive cough, shortness of breath, and feeling lethargic.  No fevers or chills.     ROS: See pertinent positives and negatives per HPI.  Past Medical History:  Diagnosis Date   Allergy    Arthritis    Basal cell carcinoma 04/03/2018   nod-left axilla- ([id number])   Complication of anesthesia 2011   "vitals dropped with endoscopy"   Diverticulitis    Diverticulosis    GERD (gastroesophageal reflux disease)    on omeprazole   History of Barrett's esophagus    History of kidney stones    10-15 yrs ago   Hyperlipidemia    Melanoma ([place]) 10/25/2011   in situ-Left post scalp (MOHS)   Melanoma ([place]) 07/05/2011   in situ-Right ant. thigh- (EXC)   Prostate cancer ([place])    Squamous cell carcinoma of skin 05/17/2019   in situ-right forarm- (CX35FU)    Past Surgical History:  Procedure Laterality Date   COLONOSCOPY     LAMINECTOMY AND MICRODISCECTOMY LUMBAR SPINE  2019   MOHS SURGERY Right    scalp   PROSTATE SURGERY  2010   SHOULDER  ARTHROSCOPY  2007   right   TOTAL HIP ARTHROPLASTY Left 05/10/2016   Procedure: TOTAL HIP ARTHROPLASTY;  Surgeon: Garald Balding, MD;  Location: [facility];  Service: Orthopedics;  Laterality: Left;   UPPER GASTROINTESTINAL ENDOSCOPY      Family History  Problem Relation Age of Onset   Colon polyps Father    Diabetes Father    Melanoma Mother    Melanoma Maternal Grandmother    Colon cancer Neg Hx    Esophageal cancer Neg Hx    Rectal cancer Neg Hx    Stomach cancer Neg Hx        Current Outpatient Medications:    aspirin 81 MG tablet, Take 81 mg by mouth daily., Disp: , Rfl:    atorvastatin (LIPITOR) 20 MG tablet, TAKE 1 TABLET BY MOUTH EVERY DAY, Disp: 90 tablet, Rfl: 0   Calcium-Magnesium (CAL-MAG PO), Take 1 tablet by mouth daily., Disp: , Rfl:    clobetasol ointment (TEMOVATE) 0.05 %, Apply to affected area qd - not for face, folds or groin, Disp: 30 g, Rfl: 4   Coenzyme Q10 (CO Q10) 60 MG CAPS, Take 1 tablet by mouth daily., Disp: , Rfl:    Crisaborole (EUCRISA) 2 % OINT, Eucrisa 2 % topical ointment, Disp: , Rfl:    Fluorouracil (TOLAK) 4 % CREA, Apply 1 application topically at bedtime. Apply qhs Monday - Sunday x 2 weeks, Disp: 40 g, Rfl: 2   fluticasone (FLONASE)  50 MCG/ACT nasal spray, USE AS DIRECTED, Disp: 32 g, Rfl: 6   Halobetasol Propionate (ULTRAVATE) 0.05 % LOTN, Apply 1 application topically 2 (two) times daily., Disp: 60 mL, Rfl: 2   ibuprofen (ADVIL) 200 MG tablet, Take 200 mg by mouth daily., Disp: , Rfl:    Multiple Vitamin (MULTIVITAMIN) tablet, Take 1 tablet by mouth daily., Disp: , Rfl:    Omega-3 1000 MG CAPS, Take 1 capsule by mouth daily., Disp: , Rfl:    omeprazole (PRILOSEC) 20 MG capsule, Take 1 capsule (20 mg total) by mouth daily., Disp: 90 capsule, Rfl: 3   triamcinolone cream (KENALOG) 0.1 %, APPLY TO AFFECTED AREA 1 2 TIMES A DAY, Disp: , Rfl:   EXAM:  VITALS per patient if applicable:  GENERAL: alert, oriented, appears well and in no acute  distress  HEENT: atraumatic, conjunttiva clear, no obvious abnormalities on inspection of external nose and ears  NECK: normal movements of the head and neck  LUNGS: on inspection no signs of respiratory distress, breathing rate appears normal, no obvious gross SOB, gasping or wheezing  CV: no obvious cyanosis  MS: moves all visible extremities without noticeable abnormality  PSYCH/NEURO: pleasant and cooperative, no obvious depression or anxiety, speech and thought processing grossly intact  ASSESSMENT AND PLAN:  Discussed the following assessment and plan:  1. Respiratory infection  - levofloxacin (LEVAQUIN) 500 MG tablet; Take 1 tablet (500 mg total) by mouth daily.  Dispense: 7 tablet; Refill: 0 - predniSONE (DELTASONE) 10 MG tablet; 40 mg x 3 days, 20 mg x 3 days, 10 mg x 3 days  Dispense: 21 tablet; Refill: 0 - Follow up if no improvement in the next 4-5 days   Dorothyann Peng, NP      I discussed the assessment and treatment plan with the patient. The patient was provided an opportunity to ask questions and all were answered. The patient agreed with the plan and demonstrated an understanding of the instructions.   The patient was advised to call back or seek an in-person evaluation if the symptoms worsen or if the condition fails to improve as anticipated.   Dorothyann Peng, NP

## 2021-11-17 ENCOUNTER — Ambulatory Visit: Payer: 59 | Admitting: Physician Assistant

## 2021-11-30 NOTE — Progress Notes (Signed)
12/06/2021 Daniel Booth [id number] 1955/09/11  Referring provider: Dorothyann Peng, NP Primary GI doctor: Dr. Luvenia Starch (previously Dr. Olevia Perches)  ASSESSMENT AND PLAN:   Esophageal dysphagia with GERD -     omeprazole (PRILOSEC) 40 MG capsule; Take 1 capsule (40 mg total) by mouth daily. Remote history of Barrett's, last 2 EGD's without evidence. Last EGD 2016 and unremarkable, sliding hiatal hernia seen on barium in 2018.  Has been on prednisone and drinks ETOH nightly, will increase omeprazole to 40 mg daily, lifestyle like weight loss and stopping ETOH discussed.  If EGd is negative consider esophageal manometry.  Will schedule EGD to evaluate for possible H. pylori, esophagitis, gastritis, peptic ulcer disease, etc.. I discussed risks of EGD with patient today, including risk of sedation, bleeding or perforation.  Patient provides understanding and gave verbal consent to proceed.   History of colonic polyps 11/28/2016 colonoscopy With Dr. Havery Moros for screening purposes showed diverticula throughout entire colon highest burden sigmoid, 4 mm polyp in ascending colon internal hemorrhoids small, pathology showed adenomatous polyp recall 5 years (10/2021) We have discussed the risks of bleeding, infection, perforation, medication reactions, and remote risk of death associated with colonoscopy. All questions were answered and the patient acknowledges these risk and wishes to proceed.  Patient Care Team: Dorothyann Peng, NP as PCP - General (Family Medicine) Eustace Moore, MD as Consulting Physician (Neurosurgery) Roseanne Kaufman, MD as Consulting Physician (Orthopedic Surgery) Janne Napoleon, PA-C (Inactive) (Dermatology) Warren Danes, PA-C as Physician Assistant (Dermatology) Lavonna Monarch, MD as Consulting Physician (Dermatology)  HISTORY OF PRESENT ILLNESS: 66 y.o. male with a past medical history of reflux, Barrett's esophagus, melanoma, prostate cancer,  diverticulosis and others listed below presents for evaluation of dysphagia.   03/09/2015 endoscopy with Dr. Havery Moros for history of Barrett's suspected benign fundic gland polyps and an otherwise a normal exam. His prior two procedures also did not show Barrett's and he was taken off of a surveillance schedule. 01/19/2017 barium swallow for globus sensation showed very small sliding-type hiatal hernia and inducible reflux, no mass, stricture or mucosal abnormality. 11/28/2016 colonoscopy With Dr. Havery Moros for screening purposes showed diverticula throughout entire colon highest burden sigmoid, 4 mm polyp in ascending colon internal hemorrhoids small, pathology showed adenomatous polyp recall 5 years (10/2021)  In the last year he has had worsening dysphagia, like chicken, occ can have it where he will try to drink water after something gets stuck but the water will come up.  2 times in last 2-3 months, getting more frequent.  He has history of GERD, states the omeprazole works well.  Ibuprofen '200mg'$  once a day, he has had 2 hand surgeries and has been on prednisone to help with mallet finger, has been on 8 days ago, down to 2 pills for 7 days and another 1 pill for 7 days. Has had frequent tapers in the last year will take off and on, maybe 3rd or 4th taper in last year.  Drink bourbon 1-2 glasses 2 fingers nightly.  No odynophagia No changes in stools, no melena, no hematochezia.  [place]   Current Medications:   Current Outpatient Medications (Endocrine & Metabolic):    predniSONE (DELTASONE) 10 MG tablet, 40 mg x 3 days, 20 mg x 3 days, 10 mg x 3 days  Current Outpatient Medications (Cardiovascular):    atorvastatin (LIPITOR) 20 MG tablet, TAKE 1 TABLET BY MOUTH EVERY DAY  Current Outpatient Medications (Respiratory):  fluticasone (FLONASE) 50 MCG/ACT nasal spray, USE AS DIRECTED  Current Outpatient Medications (Analgesics):    aspirin 81 MG  tablet, Take 81 mg by mouth daily.   ibuprofen (ADVIL) 200 MG tablet, Take 200 mg by mouth daily.   Current Outpatient Medications (Other):    Calcium-Magnesium (CAL-MAG PO), Take 1 tablet by mouth daily.   Coenzyme Q10 (CO Q10) 60 MG CAPS, Take 1 tablet by mouth daily.   Crisaborole (EUCRISA) 2 % OINT, Eucrisa 2 % topical ointment   Halobetasol Propionate (ULTRAVATE) 0.05 % LOTN, Apply 1 application topically 2 (two) times daily.   Multiple Vitamin (MULTIVITAMIN) tablet, Take 1 tablet by mouth daily.   Omega-3 1000 MG CAPS, Take 1 capsule by mouth daily.   triamcinolone cream (KENALOG) 0.1 %, APPLY TO AFFECTED AREA 1 2 TIMES A DAY   clobetasol ointment (TEMOVATE) 0.05 %, Apply to affected area qd - not for face, folds or groin (Patient not taking: Reported on 12/06/2021)   Fluorouracil (TOLAK) 4 % CREA, Apply 1 application topically at bedtime. Apply qhs Monday - Sunday x 2 weeks (Patient not taking: Reported on 12/06/2021)   levofloxacin (LEVAQUIN) 500 MG tablet, Take 1 tablet (500 mg total) by mouth daily. (Patient not taking: Reported on 12/06/2021)   omeprazole (PRILOSEC) 40 MG capsule, Take 1 capsule (40 mg total) by mouth daily.  Medical History:  Past Medical History:  Diagnosis Date   Allergy    Arthritis    Barrett's esophagus    Basal cell carcinoma 04/03/2018   nod-left axilla- ([id number])   Complication of anesthesia 2011   "vitals dropped with endoscopy"   Diverticulitis    Diverticulosis    GERD (gastroesophageal reflux disease)    on omeprazole   History of Barrett's esophagus    History of kidney stones    10-15 yrs ago   Hyperlipidemia    Melanoma ([place]) 10/25/2011   in situ-Left post scalp (MOHS)   Melanoma ([place]) 07/05/2011   in situ-Right ant. thigh- (EXC)   Prostate cancer ([place])    Squamous cell carcinoma of skin 05/17/2019   in situ-right forarm- (CX35FU)   Allergies:  Allergies  Allergen Reactions   Bactrim [Sulfamethoxazole-Trimethoprim] Other (See  Comments)    Patient felt bad with chills and sweating.   No Known Allergies      Surgical History:  He  has a past surgical history that includes Prostate surgery (2010); Shoulder arthroscopy (2007); Colonoscopy; Upper gastrointestinal endoscopy; Mohs surgery (Right); Total hip arthroplasty (Left, 05/10/2016); and Laminectomy and microdiscectomy lumbar spine (2019). Family History:  His family history includes Colon polyps in his father; Diabetes in his father; Dysphagia in his sister; Liver disease in his father; Melanoma in his maternal grandmother and mother; Ulcers in his father. Social History:   reports that he quit smoking about 39 years ago. His smoking use included cigarettes. He smoked an average of .25 packs per day. He has never used smokeless tobacco. He reports current alcohol use of about 4.0 standard drinks of alcohol per week. He reports that he does not use drugs.  REVIEW OF SYSTEMS  : All other systems reviewed and negative except where noted in the History of Present Illness.   PHYSICAL EXAM: BP 122/78   Pulse 80   Ht '5\' 10"'$  (1.778 m)   Wt 208 lb (94.3 kg)   BMI 29.84 kg/m  General:   Pleasant, well developed male in no acute distress Head:   Normocephalic and atraumatic. Eyes:  sclerae anicteric,conjunctive  pink  Heart:   regular rate and rhythm Pulm:  Clear anteriorly; no wheezing Abdomen:   Soft, Obese AB, Active bowel sounds. mild tenderness in the epigastrium. Without guarding and Without rebound, No organomegaly appreciated. Rectal: Not evaluated Extremities:  Without edema. Msk: Symmetrical without gross deformities. Peripheral pulses intact.  Neurologic:  Alert and  oriented x4;  No focal deficits.  Skin:   Dry and intact without significant lesions or rashes. Psychiatric:  Cooperative. Normal mood and affect.    Vladimir Crofts, PA-C 12:03 PM

## 2021-12-06 ENCOUNTER — Encounter: Payer: Self-pay | Admitting: Physician Assistant

## 2021-12-06 ENCOUNTER — Ambulatory Visit: Payer: 59 | Admitting: Physician Assistant

## 2021-12-06 VITALS — BP 122/78 | HR 80 | Ht 70.0 in | Wt 208.0 lb

## 2021-12-06 DIAGNOSIS — R1319 Other dysphagia: Secondary | ICD-10-CM | POA: Diagnosis not present

## 2021-12-06 DIAGNOSIS — Z86010 Personal history of colonic polyps: Secondary | ICD-10-CM | POA: Diagnosis not present

## 2021-12-06 MED ORDER — OMEPRAZOLE 40 MG PO CPDR: 40.0000 mg | DELAYED_RELEASE_CAPSULE | Freq: Every day | ORAL | 0 refills | Status: DC

## 2021-12-06 MED ORDER — NA SULFATE-K SULFATE-MG SULF 17.5-3.13-1.6 GM/177ML PO SOLN: ORAL | 0 refills | Status: DC

## 2021-12-06 NOTE — Patient Instructions (Addendum)
Please take your proton pump inhibitor medication, going to increase to 40 mg a day.   Please take this medication 30 minutes to 1 hour before meals- this makes it more effective.  Avoid spicy and acidic foods Avoid fatty foods Limit your intake of coffee, tea, alcohol, and carbonated drinks Work to maintain a healthy weight Keep the head of the bed elevated at least 3 inches with blocks or a wedge pillow if you are having any nighttime symptoms Stay upright for 2 hours after eating Avoid meals and snacks three to four hours before bedtime  CUT BACK ON THE ALCOHOL, THIS CAN CAUSE ULCERS  You have been scheduled for an endoscopy and colonoscopy. Please follow the written instructions given to you at your visit today. Please pick up your prep supplies at the pharmacy within the next 1-3 days. If you use inhalers (even only as needed), please bring them with you on the day of your procedure.   Due to recent changes in healthcare laws, you may see the results of your imaging and laboratory studies on MyChart before your provider has had a chance to review them.  We understand that in some cases there may be results that are confusing or concerning to you. Not all laboratory results come back in the same time frame and the provider may be waiting for multiple results in order to interpret others.  Please give [place] 48 hours in order for your provider to thoroughly review all the results before contacting the office for clarification of your results.    We have sent the following medications to your pharmacy for you to pick up at your convenience: SUPREP  I appreciate the  opportunity to care for you  Thank You   [facility]

## 2021-12-07 ENCOUNTER — Ambulatory Visit: Payer: 59 | Admitting: Physician Assistant

## 2021-12-07 NOTE — Progress Notes (Signed)
Agree with assessment and plan as outlined.  

## 2021-12-24 ENCOUNTER — Other Ambulatory Visit: Payer: Self-pay | Admitting: Adult Health

## 2021-12-24 MED ORDER — ATORVASTATIN CALCIUM 20 MG PO TABS: 20.0000 mg | ORAL_TABLET | Freq: Every day | ORAL | 0 refills | Status: DC

## 2022-01-14 ENCOUNTER — Encounter: Payer: Self-pay | Admitting: Gastroenterology

## 2022-01-19 ENCOUNTER — Telehealth: Payer: Self-pay | Admitting: Gastroenterology

## 2022-01-19 NOTE — Telephone Encounter (Signed)
Inbound call from patient stating he had a 'small' ceaser salad last night 9/19, he has procedure Friday, states he just has lettuce. Please advise.

## 2022-01-19 NOTE — Telephone Encounter (Signed)
Spoke with patient and told him the salad was not a problem.  I advised him to avoid those foods for the next two days, make sure he prepped corrected and drank the water as instructed.  Patient agreed.

## 2022-01-21 ENCOUNTER — Ambulatory Visit (AMBULATORY_SURGERY_CENTER): Payer: 59 | Admitting: Gastroenterology

## 2022-01-21 ENCOUNTER — Encounter: Payer: Self-pay | Admitting: Gastroenterology

## 2022-01-21 VITALS — BP 136/94 | HR 69 | Temp 98.6°F | Resp 14 | Ht 70.0 in | Wt 208.0 lb

## 2022-01-21 DIAGNOSIS — R1319 Other dysphagia: Secondary | ICD-10-CM | POA: Diagnosis not present

## 2022-01-21 DIAGNOSIS — Z86010 Personal history of colonic polyps: Secondary | ICD-10-CM

## 2022-01-21 DIAGNOSIS — K222 Esophageal obstruction: Secondary | ICD-10-CM

## 2022-01-21 DIAGNOSIS — Z09 Encounter for follow-up examination after completed treatment for conditions other than malignant neoplasm: Secondary | ICD-10-CM

## 2022-01-21 MED ORDER — SODIUM CHLORIDE 0.9 % IV SOLN: 500.0000 mL | Freq: Once | INTRAVENOUS | Status: DC

## 2022-01-21 NOTE — Progress Notes (Signed)
[facility] History and Physical   Primary Care Physician:  Dorothyann Peng, NP   Reason for Procedure:   Dysphagia, history of colon polyps  Plan:    EGD and colonoscopy     HPI: Daniel Booth is a 66 y.o. male  here for EGD to evaluate dysphagia, colonoscopy for surveillance, history of polyps. Patient denies any bowel symptoms at this time. No family history of colon cancer known. Otherwise feels well without any cardiopulmonary symptoms.   I have discussed risks / benefits of anesthesia and endoscopic procedure with Daniel Booth and they wish to proceed with the exams as outlined today.    Past Medical History:  Diagnosis Date   Allergy    Arthritis    Barrett's esophagus    Basal cell carcinoma 04/03/2018   nod-left axilla- ([id number])   Complication of anesthesia 2011   "vitals dropped with endoscopy"   Diverticulitis    Diverticulosis    GERD (gastroesophageal reflux disease)    on omeprazole   History of Barrett's esophagus    History of kidney stones    10-15 yrs ago   Hyperlipidemia    Mallet deformity of right ring finger 07/2021   no surgery   Melanoma ([place]) 10/25/2011   in situ-Left post scalp (MOHS)   Melanoma ([place]) 07/05/2011   in situ-Right ant. thigh- (EXC)   Prostate cancer ([place])    Squamous cell carcinoma of skin 05/17/2019   in situ-right forarm- (CX35FU)    Past Surgical History:  Procedure Laterality Date   CARPAL TUNNEL RELEASE  06/2021   COLONOSCOPY  2018   dupuytrens Left 05/2021   trigger finger   LAMINECTOMY AND MICRODISCECTOMY LUMBAR SPINE  2019   MOHS SURGERY Right    scalp   PROSTATE SURGERY  2010   SHOULDER ARTHROSCOPY  2007   right   TOTAL HIP ARTHROPLASTY Left 05/10/2016   Procedure: TOTAL HIP ARTHROPLASTY;  Surgeon: Garald Balding, MD;  Location: [facility];  Service: Orthopedics;  Laterality: Left;   UPPER GASTROINTESTINAL ENDOSCOPY      Prior to Admission medications   Medication Sig Start Date End Date  Taking? Authorizing Provider  aspirin 81 MG tablet Take 81 mg by mouth daily.   Yes [provider]  atorvastatin (LIPITOR) 20 MG tablet Take 1 tablet (20 mg total) by mouth daily. 12/24/21  Yes Nafziger, Tommi Rumps, NP  Calcium-Magnesium (CAL-MAG PO) Take 1 tablet by mouth daily.   Yes [provider]  Coenzyme Q10 (CO Q10) 60 MG CAPS Take 1 tablet by mouth daily.   Yes [provider]  fluticasone (FLONASE) 50 MCG/ACT nasal spray USE AS DIRECTED 05/13/13  Yes Dorena Cookey, MD  omeprazole (PRILOSEC) 40 MG capsule Take 1 capsule (40 mg total) by mouth daily. 12/06/21  Yes Vicie Mutters R, PA-C  clobetasol ointment (TEMOVATE) 0.05 % Apply to affected area qd - not for face, folds or groin Patient not taking: Reported on 12/06/2021 12/01/20   Warren Danes, PA-C  Crisaborole (EUCRISA) 2 % OINT Eucrisa 2 % topical ointment    [provider]  Fluorouracil (TOLAK) 4 % CREA Apply 1 application topically at bedtime. Apply qhs Monday - Sunday x 2 weeks Patient not taking: Reported on 12/06/2021 06/02/20   Warren Danes, PA-C  Halobetasol Propionate (ULTRAVATE) 0.05 % LOTN Apply 1 application topically 2 (two) times daily. 09/17/19   Sheffield, Ronalee Red, PA-C  ibuprofen (ADVIL) 200 MG tablet Take 200 mg by mouth  daily.    [provider]  methylPREDNISolone (MEDROL DOSEPAK) 4 MG TBPK tablet Take by mouth. Patient not taking: Reported on 01/21/2022 10/12/21   [provider]  Multiple Vitamin (MULTIVITAMIN) tablet Take 1 tablet by mouth daily.    [provider]  Omega-3 1000 MG CAPS Take 1 capsule by mouth daily.    [provider]  pimecrolimus (ELIDEL) 1 % cream Apply topically. Patient not taking: Reported on 01/21/2022 01/13/22   [provider]  predniSONE (DELTASONE) 10 MG tablet 40 mg x 3 days, 20 mg x 3 days, 10 mg x 3 days 09/17/21   Dorothyann Peng, NP  triamcinolone cream (KENALOG) 0.1 % APPLY TO AFFECTED AREA 1 2 TIMES A  DAY 06/20/19   [provider]    Current Outpatient Medications  Medication Sig Dispense Refill   aspirin 81 MG tablet Take 81 mg by mouth daily.     atorvastatin (LIPITOR) 20 MG tablet Take 1 tablet (20 mg total) by mouth daily. 90 tablet 0   Calcium-Magnesium (CAL-MAG PO) Take 1 tablet by mouth daily.     Coenzyme Q10 (CO Q10) 60 MG CAPS Take 1 tablet by mouth daily.     fluticasone (FLONASE) 50 MCG/ACT nasal spray USE AS DIRECTED 32 g 6   omeprazole (PRILOSEC) 40 MG capsule Take 1 capsule (40 mg total) by mouth daily. 90 capsule 0   clobetasol ointment (TEMOVATE) 0.05 % Apply to affected area qd - not for face, folds or groin (Patient not taking: Reported on 12/06/2021) 30 g 4   Crisaborole (EUCRISA) 2 % OINT Eucrisa 2 % topical ointment     Fluorouracil (TOLAK) 4 % CREA Apply 1 application topically at bedtime. Apply qhs Monday - Sunday x 2 weeks (Patient not taking: Reported on 12/06/2021) 40 g 2   Halobetasol Propionate (ULTRAVATE) 0.05 % LOTN Apply 1 application topically 2 (two) times daily. 60 mL 2   ibuprofen (ADVIL) 200 MG tablet Take 200 mg by mouth daily.     methylPREDNISolone (MEDROL DOSEPAK) 4 MG TBPK tablet Take by mouth. (Patient not taking: Reported on 01/21/2022)     Multiple Vitamin (MULTIVITAMIN) tablet Take 1 tablet by mouth daily.     Omega-3 1000 MG CAPS Take 1 capsule by mouth daily.     pimecrolimus (ELIDEL) 1 % cream Apply topically. (Patient not taking: Reported on 01/21/2022)     predniSONE (DELTASONE) 10 MG tablet 40 mg x 3 days, 20 mg x 3 days, 10 mg x 3 days 21 tablet 0   triamcinolone cream (KENALOG) 0.1 % APPLY TO AFFECTED AREA 1 2 TIMES A DAY     Current Facility-Administered Medications  Medication Dose Route Frequency Provider Last Rate Last Admin   0.9 %  sodium chloride infusion  500 mL Intravenous Once Crystal Scarberry, Carlota Raspberry, MD        Allergies as of 01/21/2022 - Review Complete 01/21/2022  Allergen Reaction Noted   Bactrim  [sulfamethoxazole-trimethoprim] Other (See Comments) 01/13/2021    Family History  Problem Relation Age of Onset   Melanoma Mother    Colon polyps Father    Diabetes Father    Liver disease Father    Ulcers Father    Dysphagia Sister    Melanoma Maternal Grandmother    Colon cancer Neg Hx    Esophageal cancer Neg Hx    Rectal cancer Neg Hx    Stomach cancer Neg Hx     Social History   Socioeconomic History  Marital status: Married    Spouse name: Not on file   Number of children: 0   Years of education: Not on file   Highest education level: Not on file  Occupational History   Occupation: retired  Tobacco Use   Smoking status: Former    Packs/day: 0.25    Types: Cigarettes    Quit date: 04/27/1982    Years since quitting: 39.7   Smokeless tobacco: Never  Vaping Use   Vaping Use: Never used  Substance and Sexual Activity   Alcohol use: Yes    Alcohol/week: 4.0 standard drinks of alcohol    Types: 4 Glasses of wine per week    Comment: a daily shot of liquor   Drug use: Never   Sexual activity: Yes  Other Topics Concern   Not on file  Social History Narrative   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  Physical Exam: Vital signs BP 126/84   Pulse 84   Temp 98.6 F (37 C) (Temporal)   Ht '5\' 10"'$  (1.778 m)   Wt 208 lb (94.3 kg)   SpO2 96%   BMI 29.84 kg/m   General:   Alert,  Well-developed, pleasant and cooperative in NAD Lungs:  Clear throughout to auscultation.   Heart:  Regular rate and rhythm Abdomen:  Soft, nontender and nondistended.   Neuro/Psych:  Alert  and cooperative. Normal mood and affect. A and O x 3  Jolly Mango, MD [facility]

## 2022-01-21 NOTE — Op Note (Signed)
[facility] Patient Name: Daniel Booth Procedure Date: 01/21/2022 1:27 PM MRN: [id number] Endoscopist: Remo Lipps P. Havery Moros , MD Age: 66 Referring MD:  Date of Birth: [birth year] Gender: Male Account #: [id number] Procedure:                Colonoscopy Indications:              High risk colon cancer surveillance: Personal                            history of colonic polyps - one adenoma removed                            10/2016 Medicines:                Monitored Anesthesia Care Procedure:                Pre-Anesthesia Assessment:                           - Prior to the procedure, a History and Physical                            was performed, and patient medications and                            allergies were reviewed. The patient's tolerance of                            previous anesthesia was also reviewed. The risks                            and benefits of the procedure and the sedation                            options and risks were discussed with the patient.                            All questions were answered, and informed consent                            was obtained. Prior Anticoagulants: The patient has                            taken no previous anticoagulant or antiplatelet                            agents. ASA Grade Assessment: II - A patient with                            mild systemic disease. After reviewing the risks                            and benefits, the patient was deemed in  satisfactory condition to undergo the procedure.                           After obtaining informed consent, the colonoscope                            was passed under direct vision. Throughout the                            procedure, the patient's blood pressure, pulse, and                            oxygen saturations were monitored continuously. The                            CF [id number] #[id number] was introduced through the anus                             and advanced to the the cecum, identified by                            appendiceal orifice and ileocecal valve. The                            colonoscopy was performed without difficulty. The                            patient tolerated the procedure well. The quality                            of the bowel preparation was good. The ileocecal                            valve, appendiceal orifice, and rectum were                            photographed. Scope In: 1:52:35 PM Scope Out: 2:13:23 PM Scope Withdrawal Time: 0 hours 16 minutes 10 seconds  Total Procedure Duration: 0 hours 20 minutes 48 seconds  Findings:                 The perianal and digital rectal examinations were                            normal.                           Many medium-mouthed diverticula were found in the                            entire colon. Mild in right and transverse colon.                            Severe in the left colon with restricted mobility  of the colon.                           Internal hemorrhoids were found during                            retroflexion. The hemorrhoids were small.                           The exam was otherwise without abnormality. Complications:            No immediate complications. Estimated blood loss:                            None. Estimated Blood Loss:     Estimated blood loss: none. Impression:               - Diverticulosis in the entire examined colon.                           - Internal hemorrhoids.                           - The examination was otherwise normal.                           - No polyps Recommendation:           - Patient has a contact number available for                            emergencies. The signs and symptoms of potential                            delayed complications were discussed with the                            patient. Return to normal activities tomorrow.                             Written discharge instructions were provided to the                            patient.                           - Resume previous diet.                           - Continue present medications.                           - Repeat colonoscopy in 10 years for surveillance. Remo Lipps P. Parthena Fergeson, MD 01/21/2022 2:17:12 PM This report has been signed electronically.

## 2022-01-21 NOTE — Progress Notes (Signed)
Medical record reviewed and updated

## 2022-01-21 NOTE — Progress Notes (Signed)
Called to room to assist during endoscopic procedure.  Patient ID and intended procedure confirmed with present staff. Received instructions for my participation in the procedure from the performing physician.  

## 2022-01-21 NOTE — Op Note (Signed)
[name] Patient Name: Daniel Booth Procedure Date: 01/21/2022 1:28 PM MRN: [id number] Endoscopist: Remo Lipps P. Havery Moros , MD Age: 66 Referring MD:  Date of Birth: [birth year] Gender: Male Account #: [id number] Procedure:                Upper GI endoscopy Indications:              Dysphagia Medicines:                Monitored Anesthesia Care Procedure:                Pre-Anesthesia Assessment:                           - Prior to the procedure, a History and Physical                            was performed, and patient medications and                            allergies were reviewed. The patient's tolerance of                            previous anesthesia was also reviewed. The risks                            and benefits of the procedure and the sedation                            options and risks were discussed with the patient.                            All questions were answered, and informed consent                            was obtained. Prior Anticoagulants: The patient has                            taken no previous anticoagulant or antiplatelet                            agents. ASA Grade Assessment: II - A patient with                            mild systemic disease. After reviewing the risks                            and benefits, the patient was deemed in                            satisfactory condition to undergo the procedure.                           After obtaining informed consent, the endoscope was  passed under direct vision. Throughout the                            procedure, the patient's blood pressure, pulse, and                            oxygen saturations were monitored continuously. The                            GIF HQ190 #[id number] was introduced through the                            mouth, and advanced to the second part of duodenum.                            The upper GI endoscopy was accomplished  without                            difficulty. The patient tolerated the procedure                            well. Scope In: Scope Out: Findings:                 Esophagogastric landmarks were identified: the                            Z-line was found at 40 cm, the gastroesophageal                            junction was found at 40 cm and the upper extent of                            the gastric folds was found at 40 cm from the                            incisors.                           One benign-appearing, intrinsic mild stenosis was                            found 40 cm from the incisors. This stenosis                            measured less than one cm (in length). A TTS                            dilator was passed through the scope. Dilation with                            an 18-19-20 mm balloon dilator was performed to 18  mm, 19 mm and then 20 mm after which an appropriate                            mucosal wrent was noted.                           The exam of the esophagus was otherwise normal.                           The entire examined stomach was normal.                           The examined duodenum was normal. Complications:            No immediate complications. Estimated blood loss:                            Minimal. Estimated Blood Loss:     Estimated blood loss was minimal. Impression:               - Esophagogastric landmarks identified.                           - Benign-appearing esophageal stenosis. Dilated to                            62m with good result.                           - Normal esophagus otherwise                           - Normal stomach.                           - Normal examined duodenum. Recommendation:           - Patient has a contact number available for                            emergencies. The signs and symptoms of potential                            delayed complications were discussed with  the                            patient. Return to normal activities tomorrow.                            Written discharge instructions were provided to the                            patient.                           - Post dilation diet.                           -  Continue present medications.                           - Follow up as needed if symptoms persist / recur Remo Lipps P. Havery Moros, MD 01/21/2022 2:20:25 PM This report has been signed electronically.

## 2022-01-21 NOTE — Patient Instructions (Signed)
Information on diverticulosis and strictures given to you today.  Dilation diet today - see handout.  Repeat colonoscopy in 10 years.  Continue present medications.  YOU HAD AN ENDOSCOPIC PROCEDURE TODAY AT [facility]:   Refer to the procedure report that was given to you for any specific questions about what was found during the examination.  If the procedure report does not answer your questions, please call your gastroenterologist to clarify.  If you requested that your care partner not be given the details of your procedure findings, then the procedure report has been included in a sealed envelope for you to review at your convenience later.  YOU SHOULD EXPECT: Some feelings of bloating in the abdomen. Passage of more gas than usual.  Walking can help get rid of the air that was put into your GI tract during the procedure and reduce the bloating. If you had a lower endoscopy (such as a colonoscopy or flexible sigmoidoscopy) you may notice spotting of blood in your stool or on the toilet paper. If you underwent a bowel prep for your procedure, you may not have a normal bowel movement for a few days.  Please Note:  You might notice some irritation and congestion in your nose or some drainage.  This is from the oxygen used during your procedure.  There is no need for concern and it should clear up in a day or so.  SYMPTOMS TO REPORT IMMEDIATELY:  Following lower endoscopy (colonoscopy or flexible sigmoidoscopy):  Excessive amounts of blood in the stool  Significant tenderness or worsening of abdominal pains  Swelling of the abdomen that is new, acute  Fever of 100F or higher  Following upper endoscopy (EGD)  Vomiting of blood or coffee ground material  New chest pain or pain under the shoulder blades  Painful or persistently difficult swallowing  New shortness of breath  Fever of 100F or higher  Black, tarry-looking stools  For urgent or emergent issues, a  gastroenterologist can be reached at any hour by calling [phone]. Do not use MyChart messaging for urgent concerns.    DIET:  We do recommend a small meal at first, but then you may proceed to your regular diet.  Drink plenty of fluids but you should avoid alcoholic beverages for 24 hours.  ACTIVITY:  You should plan to take it easy for the rest of today and you should NOT DRIVE or use heavy machinery until tomorrow (because of the sedation medicines used during the test).    FOLLOW UP: Our staff will call the number listed on your records the next business day following your procedure.  We will call around 7:15- 8:00 am to check on you and address any questions or concerns that you may have regarding the information given to you following your procedure. If we do not reach you, we will leave a message.     If any biopsies were taken you will be contacted by phone or by letter within the next 1-3 weeks.  Please call us at [phone] if you have not heard about the biopsies in 3 weeks.    SIGNATURES/CONFIDENTIALITY: You and/or your care partner have signed paperwork which will be entered into your electronic medical record.  These signatures attest to the fact that that the information above on your After Visit Summary has been reviewed and is understood.  Full responsibility of the confidentiality of this discharge information lies with you and/or your care-partner.

## 2022-01-21 NOTE — Progress Notes (Signed)
Report to PACU, RN, vss, BBS= Clear.  

## 2022-01-24 ENCOUNTER — Telehealth: Payer: Self-pay | Admitting: *Deleted

## 2022-01-24 NOTE — Telephone Encounter (Signed)
  Follow up Call-     01/21/2022   12:53 PM  Call back number  Post procedure Call Back phone  # [phone]  Permission to leave phone message Yes     Patient questions:  Do you have a fever, pain , or abdominal swelling? No. Pain Score  0 *  Have you tolerated food without any problems? Yes.    Have you been able to return to your normal activities? Yes.    Do you have any questions about your discharge instructions: Diet   No. Medications  No. Follow up visit  No.  Do you have questions or concerns about your Care? No.  Actions: * If pain score is 4 or above: No action needed, pain <4.

## 2022-02-11 ENCOUNTER — Encounter: Payer: 59 | Admitting: Adult Health

## 2022-03-03 ENCOUNTER — Ambulatory Visit (INDEPENDENT_AMBULATORY_CARE_PROVIDER_SITE_OTHER): Payer: 59 | Admitting: Adult Health

## 2022-03-03 ENCOUNTER — Encounter: Payer: Self-pay | Admitting: Adult Health

## 2022-03-03 VITALS — BP 120/80 | Temp 98.3°F | Ht 70.0 in | Wt 205.0 lb

## 2022-03-03 DIAGNOSIS — K219 Gastro-esophageal reflux disease without esophagitis: Secondary | ICD-10-CM | POA: Diagnosis not present

## 2022-03-03 DIAGNOSIS — R002 Palpitations: Secondary | ICD-10-CM | POA: Diagnosis not present

## 2022-03-03 DIAGNOSIS — E782 Mixed hyperlipidemia: Secondary | ICD-10-CM

## 2022-03-03 DIAGNOSIS — Z23 Encounter for immunization: Secondary | ICD-10-CM | POA: Diagnosis not present

## 2022-03-03 DIAGNOSIS — Z8546 Personal history of malignant neoplasm of prostate: Secondary | ICD-10-CM | POA: Diagnosis not present

## 2022-03-03 DIAGNOSIS — Z0000 Encounter for general adult medical examination without abnormal findings: Secondary | ICD-10-CM

## 2022-03-03 NOTE — Progress Notes (Signed)
Subjective:    Patient ID: Daniel Booth, male    DOB: 08-10-1955, 66 y.o.   MRN: [id number]  HPI Patient presents for yearly preventative medicine examination. He is a pleasant 66 year old male who  has a past medical history of Allergy, Arthritis, Barrett's esophagus, Basal cell carcinoma (25/42/7062), Complication of anesthesia (2011), Diverticulitis, Diverticulosis, GERD (gastroesophageal reflux disease), History of Barrett's esophagus, History of kidney stones, Hyperlipidemia, Mallet deformity of right ring finger (07/2021), Melanoma ([place]) (10/25/2011), Melanoma ([place]) (07/05/2011), Prostate cancer ([place]), and Squamous cell carcinoma of skin (05/17/2019).  Hyperlipidemia-managed with Lipitor 20 mg daily.  He denies myalgia or fatigue Lab Results  Component Value Date   CHOL 189 07/02/2020   HDL 68.00 07/02/2020   LDLCALC 98 07/02/2020   LDLDIRECT 148.0 06/07/2006   TRIG 115.0 07/02/2020   CHOLHDL 3 07/02/2020    GERD-controlled with omeprazole 40 mg daily.  History of prostate cancer-had prostatectomy in 2010. He has not been seen by Urology in a few years  History of skin cancer-history of basal cell, squamous cell as well as malignant melanoma.  He is seen by dermatology on a routine basis  Palpitations -he reports that since having COVID-19 about 3 weeks ago he will have intermittent episodes of palpitations that happen more frequently in the evening.  His palpitations can last for upwards of a few hours.  Denies any chest pain or shortness of breath.   All immunizations and health maintenance protocols were reviewed with the patient and needed orders were placed.  Appropriate screening laboratory values were ordered for the patient including screening of hyperlipidemia, renal function and hepatic function.  Medication reconciliation,  past medical history, social history, problem list and allergies were reviewed in detail with the patient  Goals were established with  regard to weight loss, exercise, and  diet in compliance with medications Wt Readings from Last 3 Encounters:  03/03/22 205 lb (93 kg)  01/21/22 208 lb (94.3 kg)  12/06/21 208 lb (94.3 kg)   He is up to date on routine colon cancer screening.   Review of Systems  Constitutional: Negative.   HENT: Negative.    Eyes: Negative.   Respiratory: Negative.    Cardiovascular:  Positive for palpitations.  Gastrointestinal: Negative.   Endocrine: Negative.   Genitourinary: Negative.   Musculoskeletal: Negative.   Skin: Negative.   Allergic/Immunologic: Negative.   Neurological: Negative.   Hematological: Negative.   Psychiatric/Behavioral: Negative.    All other systems reviewed and are negative.  Past Medical History:  Diagnosis Date   Allergy    Arthritis    Barrett's esophagus    Basal cell carcinoma 04/03/2018   nod-left axilla- ([id number])   Complication of anesthesia 2011   "vitals dropped with endoscopy"   Diverticulitis    Diverticulosis    GERD (gastroesophageal reflux disease)    on omeprazole   History of Barrett's esophagus    History of kidney stones    10-15 yrs ago   Hyperlipidemia    Mallet deformity of right ring finger 07/2021   no surgery   Melanoma ([place]) 10/25/2011   in situ-Left post scalp (MOHS)   Melanoma ([place]) 07/05/2011   in situ-Right ant. thigh- (EXC)   Prostate cancer ([place])    Squamous cell carcinoma of skin 05/17/2019   in situ-right forarm- (CX35FU)    Social History   Socioeconomic History   Marital status: Married    Spouse name: Not on file   Number of children: 0  Years of education: Not on file   Highest education level: Not on file  Occupational History   Occupation: retired  Tobacco Use   Smoking status: Former    Packs/day: 0.25    Types: Cigarettes    Quit date: 04/27/1982    Years since quitting: 39.8   Smokeless tobacco: Never  Vaping Use   Vaping Use: Never used  Substance and Sexual Activity   Alcohol use: Yes     Alcohol/week: 4.0 standard drinks of alcohol    Types: 4 Glasses of wine per week    Comment: a daily shot of liquor   Drug use: Never   Sexual activity: Yes  Other Topics Concern   Not on file  Social History Narrative   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    Married    No kids       Social Determinants of Radio broadcast assistant Strain: Not on file  Food Insecurity: Not on file  Transportation Needs: Not on file  Physical Activity: Not on file  Stress: Not on file  Social Connections: Not on file  Intimate Partner Violence: Not on file    Past Surgical History:  Procedure Laterality Date   CARPAL TUNNEL RELEASE  06/2021   COLONOSCOPY  2018   [place] Left 05/2021   trigger finger   LAMINECTOMY AND MICRODISCECTOMY LUMBAR SPINE  2019   MOHS SURGERY Right    scalp   PROSTATE SURGERY  2010   SHOULDER ARTHROSCOPY  2007   right   TOTAL HIP ARTHROPLASTY Left 05/10/2016   Procedure: TOTAL HIP ARTHROPLASTY;  Surgeon: Garald Balding, MD;  Location: [facility];  Service: Orthopedics;  Laterality: Left;   UPPER GASTROINTESTINAL ENDOSCOPY      Family History  Problem Relation Age of Onset   Melanoma Mother    Colon polyps Father    Diabetes Father    Liver disease Father    Ulcers Father    Dysphagia Sister    Melanoma Maternal Grandmother    Colon cancer Neg Hx    Esophageal cancer Neg Hx    Rectal cancer Neg Hx    Stomach cancer Neg Hx     Allergies  Allergen Reactions   Bactrim [Sulfamethoxazole-Trimethoprim] Other (See Comments)    Patient felt bad with chills and sweating.    Current Outpatient Medications on File Prior to Visit  Medication Sig Dispense Refill   aspirin 81 MG tablet Take 81 mg by mouth daily.     atorvastatin (LIPITOR) 20 MG tablet Take 1 tablet (20 mg total) by mouth daily. 90 tablet 0   Calcium-Magnesium (CAL-MAG PO) Take 1 tablet by mouth daily.     Coenzyme Q10 (CO Q10) 60 MG CAPS Take 1 tablet by mouth daily.      Crisaborole (EUCRISA) 2 % OINT Eucrisa 2 % topical ointment     fluticasone (FLONASE) 50 MCG/ACT nasal spray USE AS DIRECTED 32 g 6   Halobetasol Propionate (ULTRAVATE) 0.05 % LOTN Apply 1 application topically 2 (two) times daily. 60 mL 2   ibuprofen (ADVIL) 200 MG tablet Take 200 mg by mouth daily.     Multiple Vitamin (MULTIVITAMIN) tablet Take 1 tablet by mouth daily.     Omega-3 1000 MG CAPS Take 1 capsule by mouth daily.     omeprazole (PRILOSEC) 40 MG capsule Take 1 capsule (40 mg total) by mouth daily. 90 capsule 0   predniSONE (DELTASONE) 10 MG tablet 40 mg  x 3 days, 20 mg x 3 days, 10 mg x 3 days 21 tablet 0   loratadine (CLARITIN) 10 MG tablet Take by mouth.     No current facility-administered medications on file prior to visit.    BP 120/80   Temp 98.3 F (36.8 C) (Oral)   Ht '5\' 10"'$  (1.778 m)   Wt 205 lb (93 kg)   BMI 29.41 kg/m       Objective:   Physical Exam Vitals and nursing note reviewed.  Constitutional:      Appearance: Normal appearance.  Cardiovascular:     Rate and Rhythm: Normal rate and regular rhythm.     Pulses: Normal pulses.     Heart sounds: Normal heart sounds.  Pulmonary:     Effort: Pulmonary effort is normal.     Breath sounds: Normal breath sounds.  Musculoskeletal:        General: Normal range of motion.  Skin:    General: Skin is warm and dry.  Neurological:     General: No focal deficit present.     Mental Status: He is oriented to person, place, and time.  Psychiatric:        Mood and Affect: Mood normal.        Behavior: Behavior normal.        Thought Content: Thought content normal.        Judgment: Judgment normal.       Assessment & Plan:  1. Routine general medical examination at a health care facility  - CBC with Differential/Platelet; Future - Comprehensive metabolic panel; Future - Hemoglobin A1c; Future - Lipid panel; Future - TSH; Future  2. History of prostate cancer  - PSA; Future  3. Gastroesophageal  reflux disease without esophagitis  - CBC with Differential/Platelet; Future - Comprehensive metabolic panel; Future - Hemoglobin A1c; Future - Lipid panel; Future - TSH; Future  4. Mixed hyperlipidemia  - CBC with Differential/Platelet; Future - Comprehensive metabolic panel; Future - Hemoglobin A1c; Future - Lipid panel; Future - TSH; Future  5. Need for immunization against influenza  - Flu Vaccine QUAD High Dose(Fluad)  6. Palpitations  - EKG 12-Lead- SR, Rate 77  - Advised watchful waiting.  - Follow up if palpitations continue   Dorothyann Peng, NP

## 2022-03-03 NOTE — Patient Instructions (Addendum)
It was great seeing you today   We will follow up with you regarding your lab work   Please let me know if you need anything   EKG was normal

## 2022-03-04 LAB — HEMOGLOBIN A1C: Hgb A1c MFr Bld: 6 % (ref 4.6–6.5)

## 2022-03-04 LAB — CBC WITH DIFFERENTIAL/PLATELET
Basophils Absolute: 0.1 10*3/uL (ref 0.0–0.1)
Basophils Relative: 1.2 % (ref 0.0–3.0)
Eosinophils Absolute: 0.1 10*3/uL (ref 0.0–0.7)
Eosinophils Relative: 2 % (ref 0.0–5.0)
HCT: 42 % (ref 39.0–52.0)
Hemoglobin: 14 g/dL (ref 13.0–17.0)
Lymphocytes Relative: 22.4 % (ref 12.0–46.0)
Lymphs Abs: 1.5 10*3/uL (ref 0.7–4.0)
MCHC: 33.2 g/dL (ref 30.0–36.0)
MCV: 92.6 fl (ref 78.0–100.0)
Monocytes Absolute: 0.6 10*3/uL (ref 0.1–1.0)
Monocytes Relative: 8.6 % (ref 3.0–12.0)
Neutro Abs: 4.3 10*3/uL (ref 1.4–7.7)
Neutrophils Relative %: 65.8 % (ref 43.0–77.0)
Platelets: 310 10*3/uL (ref 150.0–400.0)
RBC: 4.54 Mil/uL (ref 4.22–5.81)
RDW: 13.1 % (ref 11.5–15.5)
WBC: 6.5 10*3/uL (ref 4.0–10.5)

## 2022-03-04 LAB — COMPREHENSIVE METABOLIC PANEL
ALT: 28 U/L (ref 0–53)
AST: 25 U/L (ref 0–37)
Albumin: 4.6 g/dL (ref 3.5–5.2)
Alkaline Phosphatase: 78 U/L (ref 39–117)
BUN: 14 mg/dL (ref 6–23)
CO2: 30 mEq/L (ref 19–32)
Calcium: 9.8 mg/dL (ref 8.4–10.5)
Chloride: 103 mEq/L (ref 96–112)
Creatinine, Ser: 0.85 mg/dL (ref 0.40–1.50)
GFR: 90.79 mL/min (ref >60.00)
Glucose, Bld: 97 mg/dL (ref 70–99)
Potassium: 4.6 mEq/L (ref 3.5–5.1)
Sodium: 141 mEq/L (ref 135–145)
Total Bilirubin: 0.6 mg/dL (ref 0.2–1.2)
Total Protein: 6.7 g/dL (ref 6.0–8.3)

## 2022-03-04 LAB — URINALYSIS
Bilirubin Urine: NEGATIVE
Hgb urine dipstick: NEGATIVE
Ketones, ur: NEGATIVE
Leukocytes,Ua: NEGATIVE
Nitrite: NEGATIVE
Specific Gravity, Urine: <=1.005 — AB (ref 1.000–1.030)
Total Protein, Urine: NEGATIVE
Urine Glucose: NEGATIVE
Urobilinogen, UA: 0.2 (ref 0.0–1.0)
pH: 6 (ref 5.0–8.0)

## 2022-03-04 LAB — LIPID PANEL
Cholesterol: 215 mg/dL — ABNORMAL HIGH (ref 0–200)
HDL: 72.6 mg/dL (ref >39.00)
LDL Cholesterol: 110 mg/dL — ABNORMAL HIGH (ref 0–99)
NonHDL: 142.27
Total CHOL/HDL Ratio: 3
Triglycerides: 161 mg/dL — ABNORMAL HIGH (ref 0.0–149.0)
VLDL: 32.2 mg/dL (ref 0.0–40.0)

## 2022-03-04 LAB — TSH: TSH: 1.8 u[IU]/mL (ref 0.35–5.50)

## 2022-03-04 LAB — PSA: PSA: 0 ng/mL — ABNORMAL LOW (ref 0.10–4.00)

## 2022-03-06 ENCOUNTER — Other Ambulatory Visit: Payer: Self-pay | Admitting: Physician Assistant

## 2022-03-08 ENCOUNTER — Telehealth: Payer: Self-pay | Admitting: Adult Health

## 2022-03-08 NOTE — Telephone Encounter (Signed)
Pt returning call for results

## 2022-03-11 ENCOUNTER — Other Ambulatory Visit: Payer: Self-pay | Admitting: Physician Assistant

## 2022-03-11 ENCOUNTER — Telehealth: Payer: Self-pay | Admitting: Adult Health

## 2022-03-11 NOTE — Telephone Encounter (Signed)
Patient calling requesting a refill of omeprazole (PRILOSEC) 40 MG capsule . States his insurance only clears him for 90 pills a year but he needs more. Says the pharmacy is sending info over to reqeust this

## 2022-03-14 ENCOUNTER — Telehealth: Payer: Self-pay | Admitting: Pharmacy Technician

## 2022-03-14 ENCOUNTER — Other Ambulatory Visit (HOSPITAL_COMMUNITY): Payer: Self-pay

## 2022-03-14 NOTE — Telephone Encounter (Signed)
PA has been submitted.

## 2022-03-14 NOTE — Telephone Encounter (Signed)
Patient Advocate Encounter  Received notification from [facility] that prior authorization for OMEPRAZOLE '40MG'$  is required.   PA submitted on 11.13.23 Key [id number] Status is pending    Luciano Cutter, CPhT Patient Advocate Phone: [phone]

## 2022-03-15 ENCOUNTER — Other Ambulatory Visit (HOSPITAL_COMMUNITY): Payer: Self-pay

## 2022-03-15 NOTE — Telephone Encounter (Signed)
Patient Advocate Encounter  Prior Authorization for OMEPRAZOLE '40MG'$  has been approved.    PA# [id number] Effective dates: 11.13.23 through 11.12.26  Trinty Marken B. CPhT P: [phone] F: [phone]

## 2022-06-06 ENCOUNTER — Other Ambulatory Visit: Payer: Self-pay | Admitting: Adult Health

## 2022-09-09 ENCOUNTER — Ambulatory Visit: Payer: 59 | Admitting: Adult Health

## 2022-09-16 ENCOUNTER — Telehealth: Payer: Self-pay | Admitting: Adult Health

## 2022-09-16 ENCOUNTER — Ambulatory Visit: Payer: 59 | Admitting: Adult Health

## 2022-09-16 ENCOUNTER — Encounter: Payer: Self-pay | Admitting: Adult Health

## 2022-09-16 VITALS — BP 110/82 | HR 83 | Temp 98.4°F | Ht 70.0 in | Wt 203.0 lb

## 2022-09-16 DIAGNOSIS — E782 Mixed hyperlipidemia: Secondary | ICD-10-CM | POA: Diagnosis not present

## 2022-09-16 DIAGNOSIS — R7303 Prediabetes: Secondary | ICD-10-CM | POA: Diagnosis not present

## 2022-09-16 LAB — HEPATIC FUNCTION PANEL
ALT: 28 U/L (ref 0–53)
AST: 27 U/L (ref 0–37)
Albumin: 4.5 g/dL (ref 3.5–5.2)
Alkaline Phosphatase: 84 U/L (ref 39–117)
Bilirubin, Direct: 0.1 mg/dL (ref 0.0–0.3)
Total Bilirubin: 0.5 mg/dL (ref 0.2–1.2)
Total Protein: 7 g/dL (ref 6.0–8.3)

## 2022-09-16 LAB — LIPID PANEL
Cholesterol: 181 mg/dL (ref 0–200)
HDL: 73 mg/dL (ref >39.00)
LDL Cholesterol: 93 mg/dL (ref 0–99)
NonHDL: 107.55
Total CHOL/HDL Ratio: 2
Triglycerides: 75 mg/dL (ref 0.0–149.0)
VLDL: 15 mg/dL (ref 0.0–40.0)

## 2022-09-16 LAB — HEMOGLOBIN A1C: Hgb A1c MFr Bld: 5.4 % (ref 4.6–6.5)

## 2022-09-16 MED ORDER — ATORVASTATIN CALCIUM 20 MG PO TABS: 20.0000 mg | ORAL_TABLET | Freq: Every day | ORAL | 3 refills | Status: DC

## 2022-09-16 NOTE — Progress Notes (Signed)
Subjective:    Patient ID: ARIZONA HASHIMOTO, male    DOB: 16-Jul-1955, 67 y.o.   MRN: [id number]  HPI 67 year old male who  has a past medical history of Allergy, Arthritis, Barrett's esophagus, Basal cell carcinoma (04/03/2018), Complication of anesthesia (2011), Diverticulitis, Diverticulosis, GERD (gastroesophageal reflux disease), History of Barrett's esophagus, History of kidney stones, Hyperlipidemia, Mallet deformity of right ring finger (07/2021), Melanoma (HCC) (10/25/2011), Melanoma (HCC) (07/05/2011), Prostate cancer (HCC), and Squamous cell carcinoma of skin (05/17/2019).  He presents to the office today for six month follow up regarding prediabetes and hyperlipidemia   Prediabetes - His last A1c was 6.0 - he was advised to make lifestyle modifications. He is going to the gym 3 times a week and is doing weight training training and aerobic exercise. He is feeling good overall.  Lab Results  Component Value Date   HGBA1C 6.0 03/03/2022    Hyperlipidemia - During his last visit 6 months ago lipitor was increased to 20 mg. He has been tolerating this medication well.  Lab Results  Component Value Date   CHOL 215 (H) 03/03/2022   HDL 72.60 03/03/2022   LDLCALC 110 (H) 03/03/2022   LDLDIRECT 148.0 06/07/2006   TRIG 161.0 (H) 03/03/2022   CHOLHDL 3 03/03/2022    Review of Systems See HPI   Past Medical History:  Diagnosis Date   Allergy    Arthritis    Barrett's esophagus    Basal cell carcinoma 04/03/2018   nod-left axilla- (CX35FU)   Complication of anesthesia 2011   "vitals dropped with endoscopy"   Diverticulitis    Diverticulosis    GERD (gastroesophageal reflux disease)    on omeprazole   History of Barrett's esophagus    History of kidney stones    10-15 yrs ago   Hyperlipidemia    Mallet deformity of right ring finger 07/2021   no surgery   Melanoma (HCC) 10/25/2011   in situ-Left post scalp (MOHS)   Melanoma (HCC) 07/05/2011   in situ-Right ant.  thigh- (EXC)   Prostate cancer (HCC)    Squamous cell carcinoma of skin 05/17/2019   in situ-right forarm- (CX35FU)    Social History   Socioeconomic History   Marital status: Married    Spouse name: Not on file   Number of children: 0   Years of education: Not on file   Highest education level: Bachelor's degree (e.g., BA, AB, BS)  Occupational History   Occupation: retired  Tobacco Use   Smoking status: Former    Packs/day: .25    Types: Cigarettes    Quit date: 04/27/1982    Years since quitting: 40.4   Smokeless tobacco: Never  Vaping Use   Vaping Use: Never used  Substance and Sexual Activity   Alcohol use: Yes    Alcohol/week: 4.0 standard drinks of alcohol    Types: 4 Glasses of wine per week    Comment: a daily shot of liquor   Drug use: Never   Sexual activity: Yes  Other Topics Concern   Not on file  Social History Narrative   He is a Surveyor, 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    Married    No kids       Social Determinants of Health   Financial Resource Strain: Low Risk  (09/12/2022)   Overall Financial Resource Strain (CARDIA)    Difficulty of Paying Living Expenses: Not hard at all  Food Insecurity: No Food Insecurity (09/12/2022)   Hunger Vital  Sign    Worried About Programme researcher, broadcasting/film/video in the Last Year: Never true    Ran Out of Food in the Last Year: Never true  Transportation Needs: No Transportation Needs (09/12/2022)   PRAPARE - Administrator, Civil Service (Medical): No    Lack of Transportation (Non-Medical): No  Physical Activity: Sufficiently Active (09/12/2022)   Exercise Vital Sign    Days of Exercise per Week: 3 days    Minutes of Exercise per Session: 60 min  Stress: No Stress Concern Present (09/12/2022)   [employer] of Occupational Health - Occupational Stress Questionnaire    Feeling of Stress : Only a little  Social Connections: Moderately Integrated (09/12/2022)   Social Connection and Isolation Panel [NHANES]     Frequency of Communication with Friends and Family: More than three times a week    Frequency of Social Gatherings with Friends and Family: Once a week    Attends Religious Services: 1 to 4 times per year    Active Member of [employer] or Organizations: No    Attends Engineer, structural: Not on file    Marital Status: Married  Catering manager Violence: Not on file    Past Surgical History:  Procedure Laterality Date   CARPAL TUNNEL RELEASE  06/2021   COLONOSCOPY  2018   dupuytrens Left 05/2021   trigger finger   LAMINECTOMY AND MICRODISCECTOMY LUMBAR SPINE  2019   MOHS SURGERY Right    scalp   PROSTATE SURGERY  2010   SHOULDER ARTHROSCOPY  2007   right   TOTAL HIP ARTHROPLASTY Left 05/10/2016   Procedure: TOTAL HIP ARTHROPLASTY;  Surgeon: Valeria Batman, MD;  Location: [facility] OR;  Service: Orthopedics;  Laterality: Left;   UPPER GASTROINTESTINAL ENDOSCOPY      Family History  Problem Relation Age of Onset   Melanoma Mother    Colon polyps Father    Diabetes Father    Liver disease Father    Ulcers Father    Dysphagia Sister    Melanoma Maternal Grandmother    Colon cancer Neg Hx    Esophageal cancer Neg Hx    Rectal cancer Neg Hx    Stomach cancer Neg Hx     Allergies  Allergen Reactions   Bactrim [Sulfamethoxazole-Trimethoprim] Other (See Comments)    Patient felt bad with chills and sweating.    Current Outpatient Medications on File Prior to Visit  Medication Sig Dispense Refill   aspirin 81 MG tablet Take 81 mg by mouth daily.     atorvastatin (LIPITOR) 20 MG tablet TAKE 1 TABLET BY MOUTH EVERY DAY 90 tablet 0   Calcium-Magnesium (CAL-MAG PO) Take 1 tablet by mouth daily.     Coenzyme Q10 (CO Q10) 60 MG CAPS Take 1 tablet by mouth daily.     Crisaborole (EUCRISA) 2 % OINT Eucrisa 2 % topical ointment     fluticasone (FLONASE) 50 MCG/ACT nasal spray USE AS DIRECTED 32 g 6   Halobetasol Propionate (ULTRAVATE) 0.05 % LOTN Apply 1 application topically 2  (two) times daily. 60 mL 2   ibuprofen (ADVIL) 200 MG tablet Take 200 mg by mouth daily.     loratadine (CLARITIN) 10 MG tablet Take by mouth.     Multiple Vitamin (MULTIVITAMIN) tablet Take 1 tablet by mouth daily.     Omega-3 1000 MG CAPS Take 1 capsule by mouth daily.     omeprazole (PRILOSEC) 40 MG capsule TAKE 1 CAPSULE (40 MG  TOTAL) BY MOUTH DAILY. 90 capsule 0   No current facility-administered medications on file prior to visit.    BP 110/82   Pulse 83   Temp 98.4 F (36.9 C) (Oral)   Ht 5\' 10"  (1.778 m)   Wt 203 lb (92.1 kg)   SpO2 97%   BMI 29.13 kg/m       Objective:   Physical Exam Vitals and nursing note reviewed.  Constitutional:      Appearance: Normal appearance.  Cardiovascular:     Rate and Rhythm: Normal rate and regular rhythm.     Pulses: Normal pulses.     Heart sounds: Normal heart sounds.  Pulmonary:     Effort: Pulmonary effort is normal.     Breath sounds: Normal breath sounds.  Musculoskeletal:        General: Normal range of motion.  Skin:    General: Skin is warm and dry.  Neurological:     General: No focal deficit present.     Mental Status: He is alert and oriented to person, place, and time.  Psychiatric:        Mood and Affect: Mood normal.        Behavior: Behavior normal.        Thought Content: Thought content normal.        Assessment & Plan:  1. Mixed hyperlipidemia - Continue to exercise and eat healthy.  - Lipid panel; Future - Hepatic Function Panel; Future  2. Prediabetes - Continue to eat healthy and exercise  - Hemoglobin A1c; Future   Shirline Frees, NP

## 2022-09-16 NOTE — Telephone Encounter (Signed)
Updated patient on his labs  

## 2022-10-13 ENCOUNTER — Other Ambulatory Visit: Payer: Self-pay

## 2022-10-13 MED ORDER — OMEPRAZOLE 40 MG PO CPDR: 40.0000 mg | DELAYED_RELEASE_CAPSULE | Freq: Every day | ORAL | 0 refills | Status: DC

## 2023-01-20 ENCOUNTER — Other Ambulatory Visit: Payer: Self-pay | Admitting: Physician Assistant

## 2023-03-10 ENCOUNTER — Encounter: Payer: 59 | Admitting: Adult Health

## 2023-05-04 ENCOUNTER — Other Ambulatory Visit: Payer: Self-pay | Admitting: Physician Assistant

## 2023-06-02 ENCOUNTER — Other Ambulatory Visit: Payer: Self-pay | Admitting: Physician Assistant

## 2023-07-02 ENCOUNTER — Other Ambulatory Visit: Payer: Self-pay | Admitting: Physician Assistant

## 2023-11-05 ENCOUNTER — Other Ambulatory Visit: Payer: Self-pay | Admitting: Adult Health

## 2023-11-08 ENCOUNTER — Other Ambulatory Visit: Payer: Self-pay | Admitting: Adult Health

## 2023-12-19 ENCOUNTER — Other Ambulatory Visit: Payer: Self-pay | Admitting: Adult Health

## 2023-12-19 ENCOUNTER — Telehealth: Payer: Self-pay | Admitting: Adult Health

## 2023-12-19 NOTE — Telephone Encounter (Signed)
 Copied from CRM [id number]. Topic: Clinical - Medication Refill >> Dec 19, 2023 11:43 AM Ahlexyia S wrote: Medication: atorvastatin  (LIPITOR) 20 MG tablet  Has the patient contacted their pharmacy? Yes, pt went to pharmacy today and was told that they would send a email/ message to office  (Agent: If no, request that the patient contact the pharmacy for the refill. If patient does not wish to contact the pharmacy document the reason why and proceed with request.) (Agent: If yes, when and what did the pharmacy advise?)  This is the patient's preferred pharmacy:   [id number] - [place], [place] - [address] [place] [address] Phone: [phone] Fax: [phone]  Is this the correct pharmacy for this prescription? Yes If no, delete pharmacy and type the correct one.   Has the prescription been filled recently? No  Is the patient out of the medication? Yes  Has the patient been seen for an appointment in the last year OR does the patient have an upcoming appointment? Yes  Can we respond through MyChart? Yes  Agent: Please be advised that Rx refills may take up to 3 business days. We ask that you follow-up with your pharmacy.

## 2023-12-20 ENCOUNTER — Telehealth: Payer: Self-pay

## 2023-12-20 MED ORDER — ATORVASTATIN CALCIUM 20 MG PO TABS: 20.0000 mg | ORAL_TABLET | Freq: Every day | ORAL | 0 refills | Status: DC

## 2023-12-20 NOTE — Telephone Encounter (Signed)
 Copied from CRM #[id number]. Topic: Clinical - Prescription Issue >> Dec 20, 2023 10:59 AM Aleatha BROCKS wrote: Reason for CRM: Patient would like to know why he's Refused Atorvastatin  Calcium  20 mg please give him a call if call is missed please leave message he is traveling to [place] 

## 2024-01-12 ENCOUNTER — Ambulatory Visit: Admitting: Adult Health

## 2024-01-12 ENCOUNTER — Encounter: Payer: Self-pay | Admitting: Adult Health

## 2024-01-12 VITALS — BP 120/80 | HR 74 | Temp 97.8°F | Ht 69.75 in | Wt 190.0 lb

## 2024-01-12 DIAGNOSIS — Z23 Encounter for immunization: Secondary | ICD-10-CM | POA: Diagnosis not present

## 2024-01-12 DIAGNOSIS — E782 Mixed hyperlipidemia: Secondary | ICD-10-CM | POA: Diagnosis not present

## 2024-01-12 DIAGNOSIS — R7303 Prediabetes: Secondary | ICD-10-CM

## 2024-01-12 DIAGNOSIS — Z0000 Encounter for general adult medical examination without abnormal findings: Secondary | ICD-10-CM

## 2024-01-12 DIAGNOSIS — Z8546 Personal history of malignant neoplasm of prostate: Secondary | ICD-10-CM | POA: Diagnosis not present

## 2024-01-12 DIAGNOSIS — Z85828 Personal history of other malignant neoplasm of skin: Secondary | ICD-10-CM

## 2024-01-12 DIAGNOSIS — K219 Gastro-esophageal reflux disease without esophagitis: Secondary | ICD-10-CM | POA: Diagnosis not present

## 2024-01-12 LAB — LIPID PANEL
Cholesterol: 207 mg/dL — ABNORMAL HIGH (ref 0–200)
HDL: 80.8 mg/dL (ref >39.00)
LDL Cholesterol: 102 mg/dL — ABNORMAL HIGH (ref 0–99)
NonHDL: 126.69
Total CHOL/HDL Ratio: 3
Triglycerides: 122 mg/dL (ref 0.0–149.0)
VLDL: 24.4 mg/dL (ref 0.0–40.0)

## 2024-01-12 LAB — COMPREHENSIVE METABOLIC PANEL WITH GFR
ALT: 36 U/L (ref 0–53)
AST: 30 U/L (ref 0–37)
Albumin: 4.6 g/dL (ref 3.5–5.2)
Alkaline Phosphatase: 80 U/L (ref 39–117)
BUN: 23 mg/dL (ref 6–23)
CO2: 28 meq/L (ref 19–32)
Calcium: 9.4 mg/dL (ref 8.4–10.5)
Chloride: 105 meq/L (ref 96–112)
Creatinine, Ser: 0.96 mg/dL (ref 0.40–1.50)
GFR: 81.52 mL/min (ref >60.00)
Glucose, Bld: 114 mg/dL — ABNORMAL HIGH (ref 70–99)
Potassium: 3.5 meq/L (ref 3.5–5.1)
Sodium: 143 meq/L (ref 135–145)
Total Bilirubin: 0.7 mg/dL (ref 0.2–1.2)
Total Protein: 6.6 g/dL (ref 6.0–8.3)

## 2024-01-12 LAB — CBC
HCT: 41.3 % (ref 39.0–52.0)
Hemoglobin: 13.6 g/dL (ref 13.0–17.0)
MCHC: 32.9 g/dL (ref 30.0–36.0)
MCV: 95.2 fl (ref 78.0–100.0)
Platelets: 273 K/uL (ref 150.0–400.0)
RBC: 4.34 Mil/uL (ref 4.22–5.81)
RDW: 12.7 % (ref 11.5–15.5)
WBC: 4.8 K/uL (ref 4.0–10.5)

## 2024-01-12 LAB — TSH: TSH: 1.44 u[IU]/mL (ref 0.35–5.50)

## 2024-01-12 LAB — PSA: PSA: 0 ng/mL — ABNORMAL LOW (ref 0.10–4.00)

## 2024-01-12 LAB — HEMOGLOBIN A1C: Hgb A1c MFr Bld: 5.8 % (ref 4.6–6.5)

## 2024-01-12 MED ORDER — MELOXICAM 15 MG PO TABS: 15.0000 mg | ORAL_TABLET | Freq: Every day | ORAL | 1 refills | Status: AC

## 2024-01-12 MED ORDER — ATORVASTATIN CALCIUM 20 MG PO TABS: 20.0000 mg | ORAL_TABLET | Freq: Every day | ORAL | 3 refills | Status: DC

## 2024-01-12 MED ORDER — OMEPRAZOLE 40 MG PO CPDR: 40.0000 mg | DELAYED_RELEASE_CAPSULE | Freq: Every day | ORAL | 3 refills | Status: AC

## 2024-01-12 NOTE — Progress Notes (Signed)
 Subjective:    Patient ID: Daniel Booth, male    DOB: 10/10/1955, 68 y.o.   MRN: [id number]  HPI Patient presents for yearly preventative medicine examination. He is a pleasant 68 year old male who  has a past medical history of Allergy, Arthritis, Barrett's esophagus, Basal cell carcinoma (04/03/2018), Complication of anesthesia (2011), Diverticulitis, Diverticulosis, GERD (gastroesophageal reflux disease), History of Barrett's esophagus, History of kidney stones, Hyperlipidemia, Mallet deformity of right ring finger (07/2021), Melanoma (HCC) (10/25/2011), Melanoma (HCC) (07/05/2011), Prostate cancer (HCC), and Squamous cell carcinoma of skin (05/17/2019).  Hyperlipidemia-managed with Lipitor 20 mg daily.  He denies myalgia or fatigue Lab Results  Component Value Date   CHOL 181 09/16/2022   HDL 73.00 09/16/2022   LDLCALC 93 09/16/2022   LDLDIRECT 148.0 06/07/2006   TRIG 75.0 09/16/2022   CHOLHDL 2 09/16/2022   Prediabetes - not currently on medication  Lab Results  Component Value Date   HGBA1C 5.4 09/16/2022   HGBA1C 6.0 03/03/2022   HGBA1C 5.5 07/02/2020    GERD-controlled with omeprazole  40 mg daily.    History of prostate cancer-had prostatectomy in 2010. He no longer seeing urology.   History of skin cancer-history of basal cell, squamous cell as well as malignant melanoma.  He is seen by dermatology on a routine basis  All immunizations and health maintenance protocols were reviewed with the patient and needed orders were placed.  Appropriate screening laboratory values were ordered for the patient including screening of hyperlipidemia, renal function and hepatic function. If indicated by BPH, a PSA was ordered.  Medication reconciliation,  past medical history, social history, problem list and allergies were reviewed in detail with the patient  Goals were established with regard to weight loss, exercise, and  diet in compliance with medications. He is going to the  gym twice a a week and playing golf twice a week. He is eating healthy. Hehas lost 13 pounds since I last saw him.  Wt Readings from Last 3 Encounters:  01/12/24 190 lb (86.2 kg)  09/16/22 203 lb (92.1 kg)  03/03/22 205 lb (93 kg)    Review of Systems  Constitutional: Negative.   HENT: Negative.    Eyes: Negative.   Respiratory: Negative.    Cardiovascular: Negative.   Gastrointestinal: Negative.   Endocrine: Negative.   Genitourinary: Negative.   Musculoskeletal: Negative.   Skin: Negative.   Allergic/Immunologic: Negative.   Neurological: Negative.   Hematological: Negative.   Psychiatric/Behavioral: Negative.    All other systems reviewed and are negative.    Past Medical History:  Diagnosis Date   Allergy    Arthritis    Barrett's esophagus    Basal cell carcinoma 04/03/2018   nod-left axilla- (CX35FU)   Complication of anesthesia 2011   vitals dropped with endoscopy   Diverticulitis    Diverticulosis    GERD (gastroesophageal reflux disease)    on omeprazole    History of Barrett's esophagus    History of kidney stones    10-15 yrs ago   Hyperlipidemia    Mallet deformity of right ring finger 07/2021   no surgery   Melanoma (HCC) 10/25/2011   in situ-Left post scalp (MOHS)   Melanoma (HCC) 07/05/2011   in situ-Right ant. thigh- (EXC)   Prostate cancer (HCC)    Squamous cell carcinoma of skin 05/17/2019   in situ-right forarm- (CX35FU)    Social History   Socioeconomic History   Marital status: Married    Spouse name: Not on  file   Number of children: 0   Years of education: Not on file   Highest education level: Bachelor's degree (e.g., BA, AB, BS)  Occupational History   Occupation: retired  Tobacco Use   Smoking status: Former    Current packs/day: 0.00    Types: Cigarettes    Quit date: 04/27/1982    Years since quitting: 41.7   Smokeless tobacco: Never  Vaping Use   Vaping status: Never Used  Substance and Sexual Activity   Alcohol  use: Yes    Alcohol/week: 4.0 standard drinks of alcohol    Types: 4 Glasses of wine per week    Comment: a daily shot of liquor   Drug use: Never   Sexual activity: Yes  Other Topics Concern   Not on file  Social History Narrative   He is a Surveyor, 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    Married    No kids       Social Drivers of Corporate investment banker Strain: Low Risk  (09/12/2022)   Overall Financial Resource Strain (CARDIA)    Difficulty of Paying Living Expenses: Not hard at all  Food Insecurity: No Food Insecurity (09/12/2022)   Hunger Vital Sign    Worried About Running Out of Food in the Last Year: Never true    Ran Out of Food in the Last Year: Never true  Transportation Needs: No Transportation Needs (09/12/2022)   PRAPARE - Administrator, Civil Service (Medical): No    Lack of Transportation (Non-Medical): No  Physical Activity: Sufficiently Active (09/12/2022)   Exercise Vital Sign    Days of Exercise per Week: 3 days    Minutes of Exercise per Session: 60 min  Stress: No Stress Concern Present (09/12/2022)   [employer] of Occupational Health - Occupational Stress Questionnaire    Feeling of Stress : Only a little  Social Connections: Moderately Integrated (09/12/2022)   Social Connection and Isolation Panel    Frequency of Communication with Friends and Family: More than three times a week    Frequency of Social Gatherings with Friends and Family: Once a week    Attends Religious Services: 1 to 4 times per year    Active Member of [employer] or Organizations: No    Attends Engineer, structural: Not on file    Marital Status: Married  Intimate Partner Violence: Unknown (06/29/2022)   Received from [facility]   HITS    Physically Hurt: Not on file    Insult or Talk Down To: Not on file    Threaten Physical Harm: Not on file    Scream or Curse: Not on file    Past Surgical History:  Procedure Laterality Date   CARPAL TUNNEL RELEASE  06/2021    COLONOSCOPY  2018   dupuytrens Left 05/2021   trigger finger   LAMINECTOMY AND MICRODISCECTOMY LUMBAR SPINE  2019   MOHS SURGERY Right    scalp   PROSTATE SURGERY  2010   SHOULDER ARTHROSCOPY  2007   right   TOTAL HIP ARTHROPLASTY Left 05/10/2016   Procedure: TOTAL HIP ARTHROPLASTY;  Surgeon: Maude LELON Right, MD;  Location: [facility] OR;  Service: Orthopedics;  Laterality: Left;   UPPER GASTROINTESTINAL ENDOSCOPY      Family History  Problem Relation Age of Onset   Melanoma Mother    Colon polyps Father    Diabetes Father    Liver disease Father    Ulcers Father  Dysphagia Sister    Melanoma Maternal Grandmother    Colon cancer Neg Hx    Esophageal cancer Neg Hx    Rectal cancer Neg Hx    Stomach cancer Neg Hx     Allergies  Allergen Reactions   Bactrim  [Sulfamethoxazole -Trimethoprim ] Other (See Comments)    Patient felt bad with chills and sweating.    Current Outpatient Medications on File Prior to Visit  Medication Sig Dispense Refill   aspirin 81 MG tablet Take 81 mg by mouth daily.     atorvastatin  (LIPITOR) 20 MG tablet Take 1 tablet (20 mg total) by mouth daily. 30 tablet 0   Calcium -Magnesium  (CAL-MAG PO) Take 1 tablet by mouth daily.     Coenzyme Q10 (CO Q10) 60 MG CAPS Take 1 tablet by mouth daily.     Crisaborole (EUCRISA) 2 % OINT Eucrisa 2 % topical ointment     fluticasone  (FLONASE ) 50 MCG/ACT nasal spray USE AS DIRECTED 32 g 6   hydrocortisone 2.5 % ointment APPLY TWICE DAILY TO AFFECTED EYELIDS FOR TWO WEEKS.     ibuprofen (ADVIL) 200 MG tablet Take 200 mg by mouth daily.     loratadine  (CLARITIN ) 10 MG tablet Take by mouth.     meloxicam  (MOBIC ) 15 MG tablet Take 15 mg by mouth daily.     metroNIDAZOLE  (METROCREAM ) 0.75 % cream SMARTSIG:sparingly Topical Every Night     Multiple Vitamin (MULTIVITAMIN) tablet Take 1 tablet by mouth daily.     Omega-3 1000 MG CAPS Take 1 capsule by mouth daily.     omeprazole  (PRILOSEC) 40 MG capsule TAKE 1 CAPSULE (40  MG TOTAL) BY MOUTH DAILY. 90 capsule 1   Halobetasol  Propionate (ULTRAVATE ) 0.05 % LOTN Apply 1 application topically 2 (two) times daily. (Patient not taking: Reported on 01/12/2024) 60 mL 2   No current facility-administered medications on file prior to visit.    BP 120/80   Pulse 74   Temp 97.8 F (36.6 C) (Oral)   Ht 5' 9.75 (1.772 m)   Wt 190 lb (86.2 kg)   SpO2 97%   BMI 27.46 kg/m       Objective:   Physical Exam Vitals and nursing note reviewed.  Constitutional:      Appearance: Normal appearance.  HENT:     Nose: Nose normal.     Mouth/Throat:     Mouth: Mucous membranes are moist.     Pharynx: Oropharynx is clear.  Eyes:     Extraocular Movements: Extraocular movements intact.     Pupils: Pupils are equal, round, and reactive to light.  Cardiovascular:     Rate and Rhythm: Normal rate and regular rhythm.     Pulses: Normal pulses.     Heart sounds: Normal heart sounds.  Pulmonary:     Effort: Pulmonary effort is normal.     Breath sounds: Normal breath sounds.  Abdominal:     General: Abdomen is flat. Bowel sounds are normal.     Palpations: Abdomen is soft.  Musculoskeletal:        General: Normal range of motion.  Skin:    General: Skin is warm and dry.  Neurological:     General: No focal deficit present.     Mental Status: He is alert and oriented to person, place, and time.  Psychiatric:        Mood and Affect: Mood normal.        Behavior: Behavior normal.  Thought Content: Thought content normal.        Judgment: Judgment normal.      Assessment & Plan:  1. Routine general medical examination at a health care facility (Primary) Today patient counseled on age appropriate routine health concerns for screening and prevention, each reviewed and up to date or declined. Immunizations reviewed and up to date or declined. Labs ordered and reviewed. Risk factors for depression reviewed and negative. Hearing function and visual acuity are intact.  ADLs screened and addressed as needed. Functional ability and level of safety reviewed and appropriate. Education, counseling and referrals performed based on assessed risks today. Patient provided with a copy of personalized plan for preventive services. - Follow up in one year or sooner if needed  - Continue to eat healthy and exercise   2. Mixed hyperlipidemia - Continue with Lipitor 40 mg daily  - Lipid panel; Future - TSH; Future - CBC; Future - Comprehensive metabolic panel with GFR; Future  3. Prediabetes - Consider Metformin.  - Lipid panel; Future - TSH; Future - CBC; Future - Comprehensive metabolic panel with GFR; Future - Hemoglobin A1c; Future  4. Gastroesophageal reflux disease without esophagitis - Continue PPI  - Lipid panel; Future - TSH; Future - CBC; Future - Comprehensive metabolic panel with GFR; Future  5. History of prostate cancer  - PSA; Future  6. History of skin cancer - Follow up with dermatology as directed   7. Need for influenza vaccination  - Flu vaccine HIGH DOSE PF(Fluzone Trivalent)  Brylynn Hanssen, NP

## 2024-01-12 NOTE — Patient Instructions (Signed)
 It was great seeing you today   We will follow up with you regarding your lab work   Please let me know if you need anything   Keep staying active and eating healthy

## 2024-01-16 ENCOUNTER — Ambulatory Visit: Payer: Self-pay | Admitting: Adult Health

## 2024-01-19 ENCOUNTER — Telehealth: Payer: Self-pay

## 2024-01-19 NOTE — Telephone Encounter (Signed)
 Copied from CRM [id number]. Topic: Clinical - Lab/Test Results >> Jan 19, 2024  8:35 AM Rea ORN wrote: Reason for CRM: Pt returned CMA call regarding lab results. Pt was given PCP's message regarding labs and the increase of Atorvastatin  to BID.   Pt is asking what the Glucose reading of 114 means. He stated on Mychart that it is listed as abnormal. Please call back to advise as there was no information from PCP on glucose specifically.

## 2024-01-19 NOTE — Telephone Encounter (Signed)
Called pt twice no answer.

## 2024-03-10 ENCOUNTER — Other Ambulatory Visit: Payer: Self-pay | Admitting: Adult Health

## 2024-03-12 ENCOUNTER — Other Ambulatory Visit: Payer: Self-pay | Admitting: Adult Health

## 2024-03-12 MED ORDER — ATORVASTATIN CALCIUM 40 MG PO TABS: 40.0000 mg | ORAL_TABLET | Freq: Every day | ORAL | 3 refills | Status: DC

## 2024-03-15 ENCOUNTER — Other Ambulatory Visit: Payer: Self-pay | Admitting: Adult Health

## 2024-03-15 MED ORDER — ATORVASTATIN CALCIUM 40 MG PO TABS: 40.0000 mg | ORAL_TABLET | Freq: Every day | ORAL | 3 refills | Status: AC

## 2024-05-29 ENCOUNTER — Ambulatory Visit: Payer: Self-pay

## 2024-05-29 NOTE — Telephone Encounter (Addendum)
 FYI Only or Action Required?: FYI only for provider: ED advised.  Patient was last seen in primary care on 01/12/2024 by Merna Huxley, NP.  Called Nurse Triage reporting Hypertension.  Symptoms began yesterday.  Interventions attempted: Nothing.  Symptoms are: gradually worsening.  Triage Disposition: Go to ED Now (Notify PCP)  Patient/caregiver understands and will follow disposition?: Yes     Message from [facility] C sent at 05/29/2024  1:32 PM EST  Reason for Triage: Patient was at [facility] yesterday for a toe wound stated his BP was 170/101 went and got a monitor and today checked it and was 175/110, states he's been having sob, eyes bulging, gasping for air and heart has been pounding and it's starting to worry him   Reason for Disposition  [1] Systolic BP >= 160 OR Diastolic >= 100 AND [2] cardiac (e.g., breathing difficulty, chest pain) or neurologic symptoms (e.g., new-onset blurred or double vision, unsteady gait)  Answer Assessment - Initial Assessment Questions Patient called and advised that yesterday he went to [facility] for a toe wound--he was given bandages and creams and put on an antibiotic--left big toe 170/101 was his bp at the Urgent Care Patient bought a blood pressure kit & his numbers have been elevated since yesterday He states he has moved out of [place] but kept Huxley Merna NP as his PCP He currently is in [place] (outside of [place])  During triage: 1:37pm   182/111 with a heart rate of 71   Patient states that he feels like his eyes are bulging and that he feels he is having to gasp for air--He is speaking in complete sentences and no abnormal audible respiratory sounds heard over the phone during triage. Patient states he is not having chest pain at this time but at times he can feel what he describes as his heart pounding He is not having that at this exact moment but he states he does feel like his eyes are bulging and that he is  short of breath. He denies any previous history of high blood pressure. He is advised that the recommendation at this time is to go to the Emergency Room for further evaluation. Patient states the nearest hospital, [facility], is about 5-10 minutes away and the roads are clear so he states he can go there at this time safely. He is advised that if anything changes or worsening he can pull over and call 911 at any point. He was advised he can call us  back at any point and after evaluation at the ER for followup Patient verbalized understanding.  Protocols used: Blood Pressure - High-A-AH
